# Patient Record
Sex: Female | Born: 1935 | Race: White | Hispanic: No | Smoking: Former smoker
Health system: Southern US, Community
[De-identification: ages and names within clinical notes are randomized; demographics above are authoritative.]

## PROBLEM LIST (undated history)

## (undated) DIAGNOSIS — E785 Hyperlipidemia, unspecified: Secondary | ICD-10-CM

## (undated) DIAGNOSIS — C50919 Malignant neoplasm of unspecified site of unspecified female breast: Secondary | ICD-10-CM

## (undated) DIAGNOSIS — I6529 Occlusion and stenosis of unspecified carotid artery: Secondary | ICD-10-CM

## (undated) DIAGNOSIS — F419 Anxiety disorder, unspecified: Secondary | ICD-10-CM

## (undated) DIAGNOSIS — I10 Essential (primary) hypertension: Secondary | ICD-10-CM

## (undated) HISTORY — DX: Anxiety disorder, unspecified: F41.9

## (undated) HISTORY — DX: Hyperlipidemia, unspecified: E78.5

## (undated) HISTORY — PX: LAPAROSCOPIC HYSTERECTOMY: SHX1926

## (undated) HISTORY — DX: Malignant neoplasm of unspecified site of unspecified female breast: C50.919

## (undated) HISTORY — PX: BLADDER SUSPENSION: SHX72

## (undated) HISTORY — DX: Essential (primary) hypertension: I10

## (undated) HISTORY — DX: Occlusion and stenosis of unspecified carotid artery: I65.29

---

## 2006-11-22 HISTORY — PX: BREAST SURGERY: SHX581

## 2014-11-22 HISTORY — PX: LYMPHADENECTOMY: SHX5960

## 2015-05-05 DIAGNOSIS — C8590 Non-Hodgkin lymphoma, unspecified, unspecified site: Secondary | ICD-10-CM

## 2015-05-05 DIAGNOSIS — Z8572 Personal history of non-Hodgkin lymphomas: Secondary | ICD-10-CM | POA: Insufficient documentation

## 2015-05-05 HISTORY — DX: Non-Hodgkin lymphoma, unspecified, unspecified site: C85.90

## 2015-05-25 DIAGNOSIS — J384 Edema of larynx: Secondary | ICD-10-CM | POA: Insufficient documentation

## 2015-05-25 DIAGNOSIS — R0600 Dyspnea, unspecified: Secondary | ICD-10-CM

## 2015-05-25 DIAGNOSIS — E871 Hypo-osmolality and hyponatremia: Secondary | ICD-10-CM | POA: Insufficient documentation

## 2015-05-25 DIAGNOSIS — R1310 Dysphagia, unspecified: Secondary | ICD-10-CM | POA: Insufficient documentation

## 2015-05-25 HISTORY — DX: Hypo-osmolality and hyponatremia: E87.1

## 2015-05-25 HISTORY — DX: Dysphagia, unspecified: R13.10

## 2015-05-25 HISTORY — DX: Dyspnea, unspecified: R06.00

## 2015-05-25 HISTORY — DX: Edema of larynx: J38.4

## 2015-05-27 DIAGNOSIS — I10 Essential (primary) hypertension: Secondary | ICD-10-CM

## 2015-05-27 DIAGNOSIS — T66XXXA Radiation sickness, unspecified, initial encounter: Secondary | ICD-10-CM

## 2015-05-27 HISTORY — DX: Essential (primary) hypertension: I10

## 2015-05-27 HISTORY — DX: Radiation sickness, unspecified, initial encounter: T66.XXXA

## 2015-12-04 DIAGNOSIS — I10 Essential (primary) hypertension: Secondary | ICD-10-CM

## 2015-12-04 DIAGNOSIS — H538 Other visual disturbances: Secondary | ICD-10-CM | POA: Insufficient documentation

## 2015-12-04 HISTORY — DX: Other visual disturbances: H53.8

## 2015-12-04 HISTORY — DX: Essential (primary) hypertension: I10

## 2015-12-24 DIAGNOSIS — C8330 Diffuse large B-cell lymphoma, unspecified site: Secondary | ICD-10-CM | POA: Diagnosis not present

## 2015-12-24 DIAGNOSIS — Z7982 Long term (current) use of aspirin: Secondary | ICD-10-CM | POA: Diagnosis not present

## 2015-12-24 DIAGNOSIS — Z853 Personal history of malignant neoplasm of breast: Secondary | ICD-10-CM | POA: Diagnosis not present

## 2015-12-24 DIAGNOSIS — H409 Unspecified glaucoma: Secondary | ICD-10-CM | POA: Diagnosis not present

## 2015-12-24 DIAGNOSIS — I10 Essential (primary) hypertension: Secondary | ICD-10-CM | POA: Diagnosis not present

## 2015-12-24 DIAGNOSIS — C8590 Non-Hodgkin lymphoma, unspecified, unspecified site: Secondary | ICD-10-CM | POA: Diagnosis not present

## 2015-12-24 DIAGNOSIS — Z79899 Other long term (current) drug therapy: Secondary | ICD-10-CM | POA: Diagnosis not present

## 2015-12-24 DIAGNOSIS — Z923 Personal history of irradiation: Secondary | ICD-10-CM | POA: Diagnosis not present

## 2015-12-29 DIAGNOSIS — H401122 Primary open-angle glaucoma, left eye, moderate stage: Secondary | ICD-10-CM | POA: Diagnosis not present

## 2015-12-29 DIAGNOSIS — H52223 Regular astigmatism, bilateral: Secondary | ICD-10-CM | POA: Diagnosis not present

## 2015-12-29 DIAGNOSIS — C8330 Diffuse large B-cell lymphoma, unspecified site: Secondary | ICD-10-CM | POA: Diagnosis not present

## 2015-12-29 DIAGNOSIS — H10213 Acute toxic conjunctivitis, bilateral: Secondary | ICD-10-CM | POA: Diagnosis not present

## 2015-12-29 DIAGNOSIS — H5203 Hypermetropia, bilateral: Secondary | ICD-10-CM | POA: Diagnosis not present

## 2015-12-29 DIAGNOSIS — H43813 Vitreous degeneration, bilateral: Secondary | ICD-10-CM | POA: Diagnosis not present

## 2015-12-29 DIAGNOSIS — H401111 Primary open-angle glaucoma, right eye, mild stage: Secondary | ICD-10-CM | POA: Diagnosis not present

## 2015-12-29 DIAGNOSIS — Z961 Presence of intraocular lens: Secondary | ICD-10-CM | POA: Diagnosis not present

## 2015-12-29 DIAGNOSIS — H04123 Dry eye syndrome of bilateral lacrimal glands: Secondary | ICD-10-CM | POA: Diagnosis not present

## 2015-12-29 DIAGNOSIS — H35363 Drusen (degenerative) of macula, bilateral: Secondary | ICD-10-CM | POA: Diagnosis not present

## 2016-01-05 DIAGNOSIS — I10 Essential (primary) hypertension: Secondary | ICD-10-CM | POA: Diagnosis not present

## 2016-01-22 DIAGNOSIS — M25561 Pain in right knee: Secondary | ICD-10-CM | POA: Diagnosis not present

## 2016-01-22 DIAGNOSIS — M25461 Effusion, right knee: Secondary | ICD-10-CM | POA: Insufficient documentation

## 2016-01-22 DIAGNOSIS — M25562 Pain in left knee: Secondary | ICD-10-CM | POA: Diagnosis not present

## 2016-01-22 DIAGNOSIS — M170 Bilateral primary osteoarthritis of knee: Secondary | ICD-10-CM

## 2016-01-22 HISTORY — DX: Effusion, right knee: M25.461

## 2016-01-22 HISTORY — DX: Bilateral primary osteoarthritis of knee: M17.0

## 2016-02-26 DIAGNOSIS — H43813 Vitreous degeneration, bilateral: Secondary | ICD-10-CM | POA: Diagnosis not present

## 2016-02-26 DIAGNOSIS — H52223 Regular astigmatism, bilateral: Secondary | ICD-10-CM | POA: Diagnosis not present

## 2016-02-26 DIAGNOSIS — H401111 Primary open-angle glaucoma, right eye, mild stage: Secondary | ICD-10-CM | POA: Diagnosis not present

## 2016-02-26 DIAGNOSIS — H04123 Dry eye syndrome of bilateral lacrimal glands: Secondary | ICD-10-CM | POA: Diagnosis not present

## 2016-02-26 DIAGNOSIS — C8330 Diffuse large B-cell lymphoma, unspecified site: Secondary | ICD-10-CM | POA: Diagnosis not present

## 2016-02-26 DIAGNOSIS — H401122 Primary open-angle glaucoma, left eye, moderate stage: Secondary | ICD-10-CM | POA: Diagnosis not present

## 2016-02-26 DIAGNOSIS — H34211 Partial retinal artery occlusion, right eye: Secondary | ICD-10-CM | POA: Diagnosis not present

## 2016-02-26 DIAGNOSIS — H5203 Hypermetropia, bilateral: Secondary | ICD-10-CM | POA: Diagnosis not present

## 2016-02-26 DIAGNOSIS — H35363 Drusen (degenerative) of macula, bilateral: Secondary | ICD-10-CM | POA: Diagnosis not present

## 2016-02-26 DIAGNOSIS — Z961 Presence of intraocular lens: Secondary | ICD-10-CM | POA: Diagnosis not present

## 2016-02-29 DIAGNOSIS — Z961 Presence of intraocular lens: Secondary | ICD-10-CM

## 2016-02-29 DIAGNOSIS — H04123 Dry eye syndrome of bilateral lacrimal glands: Secondary | ICD-10-CM | POA: Insufficient documentation

## 2016-02-29 DIAGNOSIS — C8330 Diffuse large B-cell lymphoma, unspecified site: Secondary | ICD-10-CM | POA: Insufficient documentation

## 2016-02-29 DIAGNOSIS — H43813 Vitreous degeneration, bilateral: Secondary | ICD-10-CM

## 2016-02-29 DIAGNOSIS — H401122 Primary open-angle glaucoma, left eye, moderate stage: Secondary | ICD-10-CM

## 2016-02-29 DIAGNOSIS — H35363 Drusen (degenerative) of macula, bilateral: Secondary | ICD-10-CM | POA: Insufficient documentation

## 2016-02-29 HISTORY — DX: Presence of intraocular lens: Z96.1

## 2016-02-29 HISTORY — DX: Primary open-angle glaucoma, left eye, moderate stage: H40.1122

## 2016-02-29 HISTORY — DX: Drusen (degenerative) of macula, bilateral: H35.363

## 2016-02-29 HISTORY — DX: Dry eye syndrome of bilateral lacrimal glands: H04.123

## 2016-02-29 HISTORY — DX: Diffuse large B-cell lymphoma, unspecified site: C83.30

## 2016-02-29 HISTORY — DX: Vitreous degeneration, bilateral: H43.813

## 2016-03-15 DIAGNOSIS — I10 Essential (primary) hypertension: Secondary | ICD-10-CM | POA: Diagnosis not present

## 2016-03-24 DIAGNOSIS — D7589 Other specified diseases of blood and blood-forming organs: Secondary | ICD-10-CM | POA: Diagnosis not present

## 2016-03-24 DIAGNOSIS — Z79899 Other long term (current) drug therapy: Secondary | ICD-10-CM | POA: Diagnosis not present

## 2016-03-24 DIAGNOSIS — I10 Essential (primary) hypertension: Secondary | ICD-10-CM | POA: Diagnosis not present

## 2016-03-24 DIAGNOSIS — C8590 Non-Hodgkin lymphoma, unspecified, unspecified site: Secondary | ICD-10-CM | POA: Diagnosis not present

## 2016-05-05 DIAGNOSIS — M170 Bilateral primary osteoarthritis of knee: Secondary | ICD-10-CM | POA: Diagnosis not present

## 2016-06-25 DIAGNOSIS — H538 Other visual disturbances: Secondary | ICD-10-CM | POA: Diagnosis not present

## 2016-06-25 DIAGNOSIS — I480 Paroxysmal atrial fibrillation: Secondary | ICD-10-CM | POA: Diagnosis not present

## 2016-06-25 DIAGNOSIS — C8590 Non-Hodgkin lymphoma, unspecified, unspecified site: Secondary | ICD-10-CM | POA: Diagnosis not present

## 2016-06-25 DIAGNOSIS — Z79899 Other long term (current) drug therapy: Secondary | ICD-10-CM | POA: Diagnosis not present

## 2016-06-25 DIAGNOSIS — R0600 Dyspnea, unspecified: Secondary | ICD-10-CM | POA: Diagnosis not present

## 2016-06-25 DIAGNOSIS — Z9289 Personal history of other medical treatment: Secondary | ICD-10-CM | POA: Diagnosis not present

## 2016-06-25 DIAGNOSIS — Z90710 Acquired absence of both cervix and uterus: Secondary | ICD-10-CM | POA: Diagnosis not present

## 2016-06-25 DIAGNOSIS — Z923 Personal history of irradiation: Secondary | ICD-10-CM | POA: Diagnosis not present

## 2016-06-25 DIAGNOSIS — M170 Bilateral primary osteoarthritis of knee: Secondary | ICD-10-CM | POA: Diagnosis not present

## 2016-06-25 DIAGNOSIS — R1310 Dysphagia, unspecified: Secondary | ICD-10-CM | POA: Diagnosis not present

## 2016-06-25 DIAGNOSIS — J384 Edema of larynx: Secondary | ICD-10-CM | POA: Diagnosis not present

## 2016-06-25 DIAGNOSIS — E871 Hypo-osmolality and hyponatremia: Secondary | ICD-10-CM | POA: Diagnosis not present

## 2016-06-25 DIAGNOSIS — Z888 Allergy status to other drugs, medicaments and biological substances status: Secondary | ICD-10-CM | POA: Diagnosis not present

## 2016-06-25 DIAGNOSIS — M25461 Effusion, right knee: Secondary | ICD-10-CM | POA: Diagnosis not present

## 2016-06-25 DIAGNOSIS — M542 Cervicalgia: Secondary | ICD-10-CM | POA: Diagnosis not present

## 2016-06-25 DIAGNOSIS — Z853 Personal history of malignant neoplasm of breast: Secondary | ICD-10-CM | POA: Diagnosis not present

## 2016-06-25 DIAGNOSIS — I10 Essential (primary) hypertension: Secondary | ICD-10-CM | POA: Diagnosis not present

## 2016-06-25 DIAGNOSIS — Z880 Allergy status to penicillin: Secondary | ICD-10-CM | POA: Diagnosis not present

## 2016-06-25 DIAGNOSIS — G4489 Other headache syndrome: Secondary | ICD-10-CM | POA: Diagnosis not present

## 2016-06-25 DIAGNOSIS — Z7982 Long term (current) use of aspirin: Secondary | ICD-10-CM | POA: Diagnosis not present

## 2016-07-01 DIAGNOSIS — Z961 Presence of intraocular lens: Secondary | ICD-10-CM | POA: Diagnosis not present

## 2016-07-01 DIAGNOSIS — I10 Essential (primary) hypertension: Secondary | ICD-10-CM | POA: Diagnosis not present

## 2016-07-01 DIAGNOSIS — H43813 Vitreous degeneration, bilateral: Secondary | ICD-10-CM | POA: Diagnosis not present

## 2016-07-01 DIAGNOSIS — H35363 Drusen (degenerative) of macula, bilateral: Secondary | ICD-10-CM | POA: Diagnosis not present

## 2016-07-01 DIAGNOSIS — H04123 Dry eye syndrome of bilateral lacrimal glands: Secondary | ICD-10-CM | POA: Diagnosis not present

## 2016-07-01 DIAGNOSIS — C8330 Diffuse large B-cell lymphoma, unspecified site: Secondary | ICD-10-CM | POA: Diagnosis not present

## 2016-07-01 DIAGNOSIS — H34211 Partial retinal artery occlusion, right eye: Secondary | ICD-10-CM | POA: Diagnosis not present

## 2016-07-01 DIAGNOSIS — H401122 Primary open-angle glaucoma, left eye, moderate stage: Secondary | ICD-10-CM | POA: Diagnosis not present

## 2016-07-01 DIAGNOSIS — Z8679 Personal history of other diseases of the circulatory system: Secondary | ICD-10-CM | POA: Diagnosis not present

## 2016-07-01 DIAGNOSIS — H401111 Primary open-angle glaucoma, right eye, mild stage: Secondary | ICD-10-CM | POA: Diagnosis not present

## 2016-07-21 DIAGNOSIS — I10 Essential (primary) hypertension: Secondary | ICD-10-CM | POA: Diagnosis not present

## 2016-07-21 DIAGNOSIS — M25412 Effusion, left shoulder: Secondary | ICD-10-CM | POA: Diagnosis not present

## 2016-07-23 DIAGNOSIS — G8929 Other chronic pain: Secondary | ICD-10-CM | POA: Diagnosis not present

## 2016-07-23 DIAGNOSIS — M12812 Other specific arthropathies, not elsewhere classified, left shoulder: Secondary | ICD-10-CM | POA: Diagnosis not present

## 2016-07-23 DIAGNOSIS — M19012 Primary osteoarthritis, left shoulder: Secondary | ICD-10-CM | POA: Diagnosis not present

## 2016-07-23 DIAGNOSIS — M25412 Effusion, left shoulder: Secondary | ICD-10-CM | POA: Diagnosis not present

## 2016-07-23 DIAGNOSIS — I10 Essential (primary) hypertension: Secondary | ICD-10-CM | POA: Diagnosis not present

## 2016-07-23 DIAGNOSIS — M25512 Pain in left shoulder: Secondary | ICD-10-CM | POA: Diagnosis not present

## 2016-08-04 DIAGNOSIS — M170 Bilateral primary osteoarthritis of knee: Secondary | ICD-10-CM | POA: Diagnosis not present

## 2016-08-12 DIAGNOSIS — M1711 Unilateral primary osteoarthritis, right knee: Secondary | ICD-10-CM | POA: Diagnosis not present

## 2016-08-12 DIAGNOSIS — M1712 Unilateral primary osteoarthritis, left knee: Secondary | ICD-10-CM | POA: Diagnosis not present

## 2016-08-12 DIAGNOSIS — M170 Bilateral primary osteoarthritis of knee: Secondary | ICD-10-CM | POA: Diagnosis not present

## 2016-09-24 DIAGNOSIS — C8331 Diffuse large B-cell lymphoma, lymph nodes of head, face, and neck: Secondary | ICD-10-CM | POA: Diagnosis not present

## 2016-09-24 DIAGNOSIS — I10 Essential (primary) hypertension: Secondary | ICD-10-CM | POA: Diagnosis not present

## 2016-09-24 DIAGNOSIS — Z888 Allergy status to other drugs, medicaments and biological substances status: Secondary | ICD-10-CM | POA: Diagnosis not present

## 2016-09-24 DIAGNOSIS — Z881 Allergy status to other antibiotic agents status: Secondary | ICD-10-CM | POA: Diagnosis not present

## 2016-09-24 DIAGNOSIS — R278 Other lack of coordination: Secondary | ICD-10-CM | POA: Diagnosis not present

## 2016-09-24 DIAGNOSIS — I480 Paroxysmal atrial fibrillation: Secondary | ICD-10-CM | POA: Diagnosis not present

## 2016-09-24 DIAGNOSIS — H401111 Primary open-angle glaucoma, right eye, mild stage: Secondary | ICD-10-CM | POA: Diagnosis not present

## 2016-09-24 DIAGNOSIS — Z90710 Acquired absence of both cervix and uterus: Secondary | ICD-10-CM | POA: Diagnosis not present

## 2016-09-24 DIAGNOSIS — Z880 Allergy status to penicillin: Secondary | ICD-10-CM | POA: Diagnosis not present

## 2016-09-24 DIAGNOSIS — Z886 Allergy status to analgesic agent status: Secondary | ICD-10-CM | POA: Diagnosis not present

## 2016-09-24 DIAGNOSIS — Z79891 Long term (current) use of opiate analgesic: Secondary | ICD-10-CM | POA: Diagnosis not present

## 2016-09-24 DIAGNOSIS — Z853 Personal history of malignant neoplasm of breast: Secondary | ICD-10-CM | POA: Diagnosis not present

## 2016-09-24 DIAGNOSIS — Z8572 Personal history of non-Hodgkin lymphomas: Secondary | ICD-10-CM | POA: Diagnosis not present

## 2016-09-24 DIAGNOSIS — R5383 Other fatigue: Secondary | ICD-10-CM | POA: Diagnosis not present

## 2016-09-24 DIAGNOSIS — H401122 Primary open-angle glaucoma, left eye, moderate stage: Secondary | ICD-10-CM | POA: Diagnosis not present

## 2016-09-24 DIAGNOSIS — G629 Polyneuropathy, unspecified: Secondary | ICD-10-CM | POA: Diagnosis not present

## 2016-09-24 DIAGNOSIS — M13819 Other specified arthritis, unspecified shoulder: Secondary | ICD-10-CM | POA: Diagnosis not present

## 2016-09-24 DIAGNOSIS — Z08 Encounter for follow-up examination after completed treatment for malignant neoplasm: Secondary | ICD-10-CM | POA: Diagnosis not present

## 2016-09-24 DIAGNOSIS — Z79899 Other long term (current) drug therapy: Secondary | ICD-10-CM | POA: Diagnosis not present

## 2016-09-24 DIAGNOSIS — H35363 Drusen (degenerative) of macula, bilateral: Secondary | ICD-10-CM | POA: Diagnosis not present

## 2016-09-24 DIAGNOSIS — H43813 Vitreous degeneration, bilateral: Secondary | ICD-10-CM | POA: Diagnosis not present

## 2016-09-24 DIAGNOSIS — Z923 Personal history of irradiation: Secondary | ICD-10-CM | POA: Diagnosis not present

## 2016-09-24 DIAGNOSIS — M170 Bilateral primary osteoarthritis of knee: Secondary | ICD-10-CM | POA: Diagnosis not present

## 2016-09-24 DIAGNOSIS — C8590 Non-Hodgkin lymphoma, unspecified, unspecified site: Secondary | ICD-10-CM | POA: Diagnosis not present

## 2016-09-24 DIAGNOSIS — Z961 Presence of intraocular lens: Secondary | ICD-10-CM | POA: Diagnosis not present

## 2016-09-24 DIAGNOSIS — Z7982 Long term (current) use of aspirin: Secondary | ICD-10-CM | POA: Diagnosis not present

## 2016-10-05 DIAGNOSIS — M542 Cervicalgia: Secondary | ICD-10-CM | POA: Diagnosis not present

## 2016-10-05 DIAGNOSIS — M62838 Other muscle spasm: Secondary | ICD-10-CM | POA: Diagnosis not present

## 2016-10-08 DIAGNOSIS — M542 Cervicalgia: Secondary | ICD-10-CM | POA: Diagnosis not present

## 2016-10-08 DIAGNOSIS — M47812 Spondylosis without myelopathy or radiculopathy, cervical region: Secondary | ICD-10-CM | POA: Diagnosis not present

## 2016-10-26 DIAGNOSIS — I10 Essential (primary) hypertension: Secondary | ICD-10-CM | POA: Diagnosis not present

## 2016-10-26 DIAGNOSIS — M85852 Other specified disorders of bone density and structure, left thigh: Secondary | ICD-10-CM

## 2016-10-26 DIAGNOSIS — Z0000 Encounter for general adult medical examination without abnormal findings: Secondary | ICD-10-CM | POA: Diagnosis not present

## 2016-10-26 DIAGNOSIS — G609 Hereditary and idiopathic neuropathy, unspecified: Secondary | ICD-10-CM

## 2016-10-26 DIAGNOSIS — Z8572 Personal history of non-Hodgkin lymphomas: Secondary | ICD-10-CM

## 2016-10-26 DIAGNOSIS — M8580 Other specified disorders of bone density and structure, unspecified site: Secondary | ICD-10-CM | POA: Insufficient documentation

## 2016-10-26 DIAGNOSIS — E785 Hyperlipidemia, unspecified: Secondary | ICD-10-CM | POA: Insufficient documentation

## 2016-10-26 DIAGNOSIS — M170 Bilateral primary osteoarthritis of knee: Secondary | ICD-10-CM | POA: Diagnosis not present

## 2016-10-26 HISTORY — DX: Hereditary and idiopathic neuropathy, unspecified: G60.9

## 2016-10-26 HISTORY — DX: Hyperlipidemia, unspecified: E78.5

## 2016-10-26 HISTORY — DX: Personal history of non-Hodgkin lymphomas: Z85.72

## 2016-10-26 HISTORY — DX: Other specified disorders of bone density and structure, left thigh: M85.852

## 2016-11-11 DIAGNOSIS — Z8572 Personal history of non-Hodgkin lymphomas: Secondary | ICD-10-CM | POA: Diagnosis not present

## 2016-11-11 DIAGNOSIS — M170 Bilateral primary osteoarthritis of knee: Secondary | ICD-10-CM | POA: Diagnosis not present

## 2016-11-11 DIAGNOSIS — G609 Hereditary and idiopathic neuropathy, unspecified: Secondary | ICD-10-CM | POA: Diagnosis not present

## 2016-11-11 DIAGNOSIS — M85859 Other specified disorders of bone density and structure, unspecified thigh: Secondary | ICD-10-CM | POA: Diagnosis not present

## 2016-11-11 DIAGNOSIS — E785 Hyperlipidemia, unspecified: Secondary | ICD-10-CM | POA: Diagnosis not present

## 2017-01-06 DIAGNOSIS — H43813 Vitreous degeneration, bilateral: Secondary | ICD-10-CM | POA: Diagnosis not present

## 2017-01-06 DIAGNOSIS — H35363 Drusen (degenerative) of macula, bilateral: Secondary | ICD-10-CM | POA: Diagnosis not present

## 2017-01-06 DIAGNOSIS — Z961 Presence of intraocular lens: Secondary | ICD-10-CM | POA: Diagnosis not present

## 2017-01-06 DIAGNOSIS — H401122 Primary open-angle glaucoma, left eye, moderate stage: Secondary | ICD-10-CM | POA: Diagnosis not present

## 2017-01-06 DIAGNOSIS — H401111 Primary open-angle glaucoma, right eye, mild stage: Secondary | ICD-10-CM | POA: Diagnosis not present

## 2017-01-06 DIAGNOSIS — H04123 Dry eye syndrome of bilateral lacrimal glands: Secondary | ICD-10-CM | POA: Diagnosis not present

## 2017-01-06 DIAGNOSIS — H34211 Partial retinal artery occlusion, right eye: Secondary | ICD-10-CM | POA: Diagnosis not present

## 2017-01-11 DIAGNOSIS — M1712 Unilateral primary osteoarthritis, left knee: Secondary | ICD-10-CM

## 2017-01-11 DIAGNOSIS — M1711 Unilateral primary osteoarthritis, right knee: Secondary | ICD-10-CM | POA: Diagnosis not present

## 2017-01-11 DIAGNOSIS — M25461 Effusion, right knee: Secondary | ICD-10-CM | POA: Diagnosis not present

## 2017-01-11 HISTORY — DX: Unilateral primary osteoarthritis, left knee: M17.12

## 2017-01-26 DIAGNOSIS — C8590 Non-Hodgkin lymphoma, unspecified, unspecified site: Secondary | ICD-10-CM | POA: Diagnosis not present

## 2017-01-26 DIAGNOSIS — C8330 Diffuse large B-cell lymphoma, unspecified site: Secondary | ICD-10-CM | POA: Diagnosis not present

## 2017-01-26 DIAGNOSIS — I480 Paroxysmal atrial fibrillation: Secondary | ICD-10-CM | POA: Diagnosis not present

## 2017-01-26 DIAGNOSIS — Z7982 Long term (current) use of aspirin: Secondary | ICD-10-CM | POA: Diagnosis not present

## 2017-01-26 DIAGNOSIS — I10 Essential (primary) hypertension: Secondary | ICD-10-CM | POA: Diagnosis not present

## 2017-01-26 DIAGNOSIS — Z961 Presence of intraocular lens: Secondary | ICD-10-CM | POA: Diagnosis not present

## 2017-01-26 DIAGNOSIS — Z923 Personal history of irradiation: Secondary | ICD-10-CM | POA: Diagnosis not present

## 2017-01-26 DIAGNOSIS — M25461 Effusion, right knee: Secondary | ICD-10-CM | POA: Diagnosis not present

## 2017-01-26 DIAGNOSIS — M170 Bilateral primary osteoarthritis of knee: Secondary | ICD-10-CM | POA: Diagnosis not present

## 2017-03-21 DIAGNOSIS — I700 Atherosclerosis of aorta: Secondary | ICD-10-CM | POA: Diagnosis not present

## 2017-03-21 DIAGNOSIS — M25512 Pain in left shoulder: Secondary | ICD-10-CM | POA: Diagnosis not present

## 2017-03-21 DIAGNOSIS — G8929 Other chronic pain: Secondary | ICD-10-CM | POA: Diagnosis not present

## 2017-03-21 DIAGNOSIS — M19012 Primary osteoarthritis, left shoulder: Secondary | ICD-10-CM | POA: Diagnosis not present

## 2017-03-21 DIAGNOSIS — I10 Essential (primary) hypertension: Secondary | ICD-10-CM | POA: Diagnosis not present

## 2017-03-21 DIAGNOSIS — J40 Bronchitis, not specified as acute or chronic: Secondary | ICD-10-CM | POA: Diagnosis not present

## 2017-03-21 DIAGNOSIS — M67431 Ganglion, right wrist: Secondary | ICD-10-CM | POA: Diagnosis not present

## 2017-04-07 DIAGNOSIS — M19012 Primary osteoarthritis, left shoulder: Secondary | ICD-10-CM | POA: Diagnosis not present

## 2017-04-07 DIAGNOSIS — G8929 Other chronic pain: Secondary | ICD-10-CM | POA: Diagnosis not present

## 2017-04-07 DIAGNOSIS — M25512 Pain in left shoulder: Secondary | ICD-10-CM | POA: Diagnosis not present

## 2017-04-12 DIAGNOSIS — M19011 Primary osteoarthritis, right shoulder: Secondary | ICD-10-CM

## 2017-04-12 DIAGNOSIS — M19012 Primary osteoarthritis, left shoulder: Secondary | ICD-10-CM

## 2017-04-12 HISTORY — DX: Primary osteoarthritis, left shoulder: M19.012

## 2017-04-12 HISTORY — DX: Primary osteoarthritis, right shoulder: M19.011

## 2017-04-25 DIAGNOSIS — G8929 Other chronic pain: Secondary | ICD-10-CM | POA: Diagnosis not present

## 2017-04-25 DIAGNOSIS — M25512 Pain in left shoulder: Secondary | ICD-10-CM | POA: Diagnosis not present

## 2017-04-25 DIAGNOSIS — M6281 Muscle weakness (generalized): Secondary | ICD-10-CM | POA: Diagnosis not present

## 2017-04-25 DIAGNOSIS — M25612 Stiffness of left shoulder, not elsewhere classified: Secondary | ICD-10-CM | POA: Diagnosis not present

## 2017-04-26 DIAGNOSIS — H903 Sensorineural hearing loss, bilateral: Secondary | ICD-10-CM

## 2017-04-26 DIAGNOSIS — H6123 Impacted cerumen, bilateral: Secondary | ICD-10-CM

## 2017-04-26 HISTORY — DX: Impacted cerumen, bilateral: H61.23

## 2017-04-26 HISTORY — DX: Sensorineural hearing loss, bilateral: H90.3

## 2017-05-02 DIAGNOSIS — G8929 Other chronic pain: Secondary | ICD-10-CM | POA: Diagnosis not present

## 2017-05-02 DIAGNOSIS — M6281 Muscle weakness (generalized): Secondary | ICD-10-CM | POA: Diagnosis not present

## 2017-05-02 DIAGNOSIS — M25612 Stiffness of left shoulder, not elsewhere classified: Secondary | ICD-10-CM | POA: Diagnosis not present

## 2017-05-02 DIAGNOSIS — M25512 Pain in left shoulder: Secondary | ICD-10-CM | POA: Diagnosis not present

## 2017-05-05 DIAGNOSIS — M25612 Stiffness of left shoulder, not elsewhere classified: Secondary | ICD-10-CM | POA: Diagnosis not present

## 2017-05-05 DIAGNOSIS — M6281 Muscle weakness (generalized): Secondary | ICD-10-CM | POA: Diagnosis not present

## 2017-05-05 DIAGNOSIS — G8929 Other chronic pain: Secondary | ICD-10-CM | POA: Diagnosis not present

## 2017-05-05 DIAGNOSIS — M25512 Pain in left shoulder: Secondary | ICD-10-CM | POA: Diagnosis not present

## 2017-05-10 DIAGNOSIS — M25512 Pain in left shoulder: Secondary | ICD-10-CM | POA: Diagnosis not present

## 2017-05-10 DIAGNOSIS — G8929 Other chronic pain: Secondary | ICD-10-CM | POA: Diagnosis not present

## 2017-05-10 DIAGNOSIS — M25612 Stiffness of left shoulder, not elsewhere classified: Secondary | ICD-10-CM | POA: Diagnosis not present

## 2017-05-10 DIAGNOSIS — M6281 Muscle weakness (generalized): Secondary | ICD-10-CM | POA: Diagnosis not present

## 2017-05-17 DIAGNOSIS — G8929 Other chronic pain: Secondary | ICD-10-CM | POA: Diagnosis not present

## 2017-05-17 DIAGNOSIS — M25612 Stiffness of left shoulder, not elsewhere classified: Secondary | ICD-10-CM | POA: Diagnosis not present

## 2017-05-17 DIAGNOSIS — M6281 Muscle weakness (generalized): Secondary | ICD-10-CM | POA: Diagnosis not present

## 2017-05-17 DIAGNOSIS — M25512 Pain in left shoulder: Secondary | ICD-10-CM | POA: Diagnosis not present

## 2017-07-07 DIAGNOSIS — H401111 Primary open-angle glaucoma, right eye, mild stage: Secondary | ICD-10-CM | POA: Diagnosis not present

## 2017-07-07 DIAGNOSIS — H401122 Primary open-angle glaucoma, left eye, moderate stage: Secondary | ICD-10-CM | POA: Diagnosis not present

## 2017-07-07 DIAGNOSIS — H34211 Partial retinal artery occlusion, right eye: Secondary | ICD-10-CM | POA: Diagnosis not present

## 2017-07-07 DIAGNOSIS — H04123 Dry eye syndrome of bilateral lacrimal glands: Secondary | ICD-10-CM | POA: Diagnosis not present

## 2017-07-13 DIAGNOSIS — I10 Essential (primary) hypertension: Secondary | ICD-10-CM | POA: Diagnosis not present

## 2017-07-13 DIAGNOSIS — R490 Dysphonia: Secondary | ICD-10-CM | POA: Diagnosis not present

## 2017-07-13 DIAGNOSIS — F322 Major depressive disorder, single episode, severe without psychotic features: Secondary | ICD-10-CM | POA: Diagnosis not present

## 2017-07-13 DIAGNOSIS — R1310 Dysphagia, unspecified: Secondary | ICD-10-CM | POA: Diagnosis not present

## 2017-08-02 DIAGNOSIS — R1310 Dysphagia, unspecified: Secondary | ICD-10-CM | POA: Diagnosis not present

## 2017-08-17 DIAGNOSIS — Z923 Personal history of irradiation: Secondary | ICD-10-CM | POA: Diagnosis not present

## 2017-08-17 DIAGNOSIS — N63 Unspecified lump in unspecified breast: Secondary | ICD-10-CM | POA: Diagnosis not present

## 2017-08-17 DIAGNOSIS — C8330 Diffuse large B-cell lymphoma, unspecified site: Secondary | ICD-10-CM | POA: Diagnosis not present

## 2017-08-17 DIAGNOSIS — C50911 Malignant neoplasm of unspecified site of right female breast: Secondary | ICD-10-CM

## 2017-08-17 DIAGNOSIS — Z853 Personal history of malignant neoplasm of breast: Secondary | ICD-10-CM | POA: Diagnosis not present

## 2017-08-17 DIAGNOSIS — N6310 Unspecified lump in the right breast, unspecified quadrant: Secondary | ICD-10-CM | POA: Diagnosis not present

## 2017-08-17 HISTORY — DX: Malignant neoplasm of unspecified site of right female breast: C50.911

## 2017-08-26 DIAGNOSIS — R921 Mammographic calcification found on diagnostic imaging of breast: Secondary | ICD-10-CM | POA: Diagnosis not present

## 2017-08-26 DIAGNOSIS — N6311 Unspecified lump in the right breast, upper outer quadrant: Secondary | ICD-10-CM | POA: Diagnosis not present

## 2017-09-21 DIAGNOSIS — I10 Essential (primary) hypertension: Secondary | ICD-10-CM | POA: Diagnosis not present

## 2017-09-21 DIAGNOSIS — G609 Hereditary and idiopathic neuropathy, unspecified: Secondary | ICD-10-CM | POA: Diagnosis not present

## 2017-09-26 DIAGNOSIS — C50911 Malignant neoplasm of unspecified site of right female breast: Secondary | ICD-10-CM | POA: Diagnosis not present

## 2017-09-26 DIAGNOSIS — R920 Mammographic microcalcification found on diagnostic imaging of breast: Secondary | ICD-10-CM | POA: Diagnosis not present

## 2017-09-27 DIAGNOSIS — R920 Mammographic microcalcification found on diagnostic imaging of breast: Secondary | ICD-10-CM

## 2017-09-27 HISTORY — DX: Mammographic microcalcification found on diagnostic imaging of breast: R92.0

## 2017-10-04 DIAGNOSIS — N6032 Fibrosclerosis of left breast: Secondary | ICD-10-CM | POA: Diagnosis not present

## 2017-10-04 DIAGNOSIS — R920 Mammographic microcalcification found on diagnostic imaging of breast: Secondary | ICD-10-CM | POA: Diagnosis not present

## 2017-10-10 DIAGNOSIS — R920 Mammographic microcalcification found on diagnostic imaging of breast: Secondary | ICD-10-CM | POA: Diagnosis not present

## 2017-12-13 DIAGNOSIS — C8330 Diffuse large B-cell lymphoma, unspecified site: Secondary | ICD-10-CM | POA: Diagnosis not present

## 2017-12-13 DIAGNOSIS — Z853 Personal history of malignant neoplasm of breast: Secondary | ICD-10-CM | POA: Diagnosis not present

## 2017-12-13 DIAGNOSIS — R2231 Localized swelling, mass and lump, right upper limb: Secondary | ICD-10-CM | POA: Diagnosis not present

## 2017-12-19 DIAGNOSIS — C8330 Diffuse large B-cell lymphoma, unspecified site: Secondary | ICD-10-CM | POA: Diagnosis not present

## 2017-12-19 DIAGNOSIS — N6489 Other specified disorders of breast: Secondary | ICD-10-CM | POA: Diagnosis not present

## 2017-12-19 DIAGNOSIS — I10 Essential (primary) hypertension: Secondary | ICD-10-CM | POA: Diagnosis not present

## 2017-12-19 DIAGNOSIS — I2510 Atherosclerotic heart disease of native coronary artery without angina pectoris: Secondary | ICD-10-CM | POA: Diagnosis not present

## 2017-12-19 DIAGNOSIS — I700 Atherosclerosis of aorta: Secondary | ICD-10-CM | POA: Diagnosis not present

## 2017-12-19 DIAGNOSIS — R2231 Localized swelling, mass and lump, right upper limb: Secondary | ICD-10-CM | POA: Diagnosis not present

## 2017-12-19 DIAGNOSIS — R221 Localized swelling, mass and lump, neck: Secondary | ICD-10-CM | POA: Diagnosis not present

## 2017-12-20 DIAGNOSIS — F322 Major depressive disorder, single episode, severe without psychotic features: Secondary | ICD-10-CM | POA: Diagnosis not present

## 2017-12-20 DIAGNOSIS — G609 Hereditary and idiopathic neuropathy, unspecified: Secondary | ICD-10-CM | POA: Diagnosis not present

## 2017-12-20 DIAGNOSIS — M25819 Other specified joint disorders, unspecified shoulder: Secondary | ICD-10-CM

## 2017-12-20 DIAGNOSIS — R1310 Dysphagia, unspecified: Secondary | ICD-10-CM | POA: Diagnosis not present

## 2017-12-20 DIAGNOSIS — Z0000 Encounter for general adult medical examination without abnormal findings: Secondary | ICD-10-CM | POA: Diagnosis not present

## 2017-12-20 HISTORY — DX: Other specified joint disorders, unspecified shoulder: M25.819

## 2018-01-02 DIAGNOSIS — M67411 Ganglion, right shoulder: Secondary | ICD-10-CM | POA: Diagnosis not present

## 2018-01-02 DIAGNOSIS — M19011 Primary osteoarthritis, right shoulder: Secondary | ICD-10-CM | POA: Diagnosis not present

## 2018-01-02 DIAGNOSIS — M25512 Pain in left shoulder: Secondary | ICD-10-CM | POA: Diagnosis not present

## 2018-01-02 DIAGNOSIS — M75101 Unspecified rotator cuff tear or rupture of right shoulder, not specified as traumatic: Secondary | ICD-10-CM | POA: Diagnosis not present

## 2018-01-02 DIAGNOSIS — M25819 Other specified joint disorders, unspecified shoulder: Secondary | ICD-10-CM | POA: Diagnosis not present

## 2018-01-02 DIAGNOSIS — R2231 Localized swelling, mass and lump, right upper limb: Secondary | ICD-10-CM | POA: Diagnosis not present

## 2018-01-03 DIAGNOSIS — M12811 Other specific arthropathies, not elsewhere classified, right shoulder: Secondary | ICD-10-CM

## 2018-01-03 DIAGNOSIS — M75101 Unspecified rotator cuff tear or rupture of right shoulder, not specified as traumatic: Secondary | ICD-10-CM

## 2018-01-03 HISTORY — DX: Unspecified rotator cuff tear or rupture of right shoulder, not specified as traumatic: M75.101

## 2018-01-03 HISTORY — DX: Other specific arthropathies, not elsewhere classified, right shoulder: M12.811

## 2018-01-11 DIAGNOSIS — H401111 Primary open-angle glaucoma, right eye, mild stage: Secondary | ICD-10-CM | POA: Diagnosis not present

## 2018-01-11 DIAGNOSIS — H401122 Primary open-angle glaucoma, left eye, moderate stage: Secondary | ICD-10-CM | POA: Diagnosis not present

## 2018-02-07 DIAGNOSIS — H903 Sensorineural hearing loss, bilateral: Secondary | ICD-10-CM | POA: Diagnosis not present

## 2018-02-07 DIAGNOSIS — H6123 Impacted cerumen, bilateral: Secondary | ICD-10-CM | POA: Diagnosis not present

## 2018-02-12 DIAGNOSIS — H6123 Impacted cerumen, bilateral: Secondary | ICD-10-CM

## 2018-02-12 HISTORY — DX: Impacted cerumen, bilateral: H61.23

## 2018-04-03 DIAGNOSIS — M67411 Ganglion, right shoulder: Secondary | ICD-10-CM | POA: Diagnosis not present

## 2018-04-03 DIAGNOSIS — M25511 Pain in right shoulder: Secondary | ICD-10-CM | POA: Diagnosis not present

## 2018-04-03 DIAGNOSIS — M12811 Other specific arthropathies, not elsewhere classified, right shoulder: Secondary | ICD-10-CM | POA: Diagnosis not present

## 2018-04-03 DIAGNOSIS — M7989 Other specified soft tissue disorders: Secondary | ICD-10-CM | POA: Diagnosis not present

## 2018-06-07 DIAGNOSIS — M25811 Other specified joint disorders, right shoulder: Secondary | ICD-10-CM | POA: Diagnosis not present

## 2018-06-07 DIAGNOSIS — M25512 Pain in left shoulder: Secondary | ICD-10-CM | POA: Diagnosis not present

## 2018-06-07 DIAGNOSIS — M25511 Pain in right shoulder: Secondary | ICD-10-CM | POA: Diagnosis not present

## 2018-06-07 DIAGNOSIS — M19012 Primary osteoarthritis, left shoulder: Secondary | ICD-10-CM | POA: Diagnosis not present

## 2018-07-05 DIAGNOSIS — Z8572 Personal history of non-Hodgkin lymphomas: Secondary | ICD-10-CM | POA: Diagnosis not present

## 2018-07-05 DIAGNOSIS — C8330 Diffuse large B-cell lymphoma, unspecified site: Secondary | ICD-10-CM | POA: Diagnosis not present

## 2018-07-05 DIAGNOSIS — Z08 Encounter for follow-up examination after completed treatment for malignant neoplasm: Secondary | ICD-10-CM | POA: Diagnosis not present

## 2018-07-05 DIAGNOSIS — M19012 Primary osteoarthritis, left shoulder: Secondary | ICD-10-CM | POA: Diagnosis not present

## 2018-07-05 DIAGNOSIS — M19011 Primary osteoarthritis, right shoulder: Secondary | ICD-10-CM | POA: Diagnosis not present

## 2018-07-05 DIAGNOSIS — Z853 Personal history of malignant neoplasm of breast: Secondary | ICD-10-CM | POA: Diagnosis not present

## 2018-07-12 DIAGNOSIS — H04123 Dry eye syndrome of bilateral lacrimal glands: Secondary | ICD-10-CM | POA: Diagnosis not present

## 2018-07-12 DIAGNOSIS — H43813 Vitreous degeneration, bilateral: Secondary | ICD-10-CM | POA: Diagnosis not present

## 2018-07-12 DIAGNOSIS — H401111 Primary open-angle glaucoma, right eye, mild stage: Secondary | ICD-10-CM | POA: Diagnosis not present

## 2018-07-12 DIAGNOSIS — H401122 Primary open-angle glaucoma, left eye, moderate stage: Secondary | ICD-10-CM | POA: Diagnosis not present

## 2018-07-12 DIAGNOSIS — H34211 Partial retinal artery occlusion, right eye: Secondary | ICD-10-CM | POA: Diagnosis not present

## 2018-07-12 DIAGNOSIS — H35363 Drusen (degenerative) of macula, bilateral: Secondary | ICD-10-CM | POA: Diagnosis not present

## 2018-07-12 DIAGNOSIS — H52223 Regular astigmatism, bilateral: Secondary | ICD-10-CM | POA: Diagnosis not present

## 2018-07-12 DIAGNOSIS — Z961 Presence of intraocular lens: Secondary | ICD-10-CM | POA: Diagnosis not present

## 2018-07-12 DIAGNOSIS — H5203 Hypermetropia, bilateral: Secondary | ICD-10-CM | POA: Diagnosis not present

## 2018-07-20 DIAGNOSIS — M7542 Impingement syndrome of left shoulder: Secondary | ICD-10-CM | POA: Diagnosis not present

## 2018-07-20 DIAGNOSIS — M19012 Primary osteoarthritis, left shoulder: Secondary | ICD-10-CM | POA: Diagnosis not present

## 2018-08-08 DIAGNOSIS — M19012 Primary osteoarthritis, left shoulder: Secondary | ICD-10-CM | POA: Diagnosis not present

## 2018-09-06 DIAGNOSIS — M542 Cervicalgia: Secondary | ICD-10-CM | POA: Diagnosis not present

## 2018-09-06 DIAGNOSIS — M19012 Primary osteoarthritis, left shoulder: Secondary | ICD-10-CM | POA: Diagnosis not present

## 2018-10-03 DIAGNOSIS — M25412 Effusion, left shoulder: Secondary | ICD-10-CM | POA: Diagnosis not present

## 2018-10-03 DIAGNOSIS — R002 Palpitations: Secondary | ICD-10-CM | POA: Diagnosis not present

## 2018-10-03 DIAGNOSIS — I10 Essential (primary) hypertension: Secondary | ICD-10-CM | POA: Diagnosis not present

## 2018-12-25 DIAGNOSIS — I10 Essential (primary) hypertension: Secondary | ICD-10-CM | POA: Diagnosis not present

## 2018-12-27 DIAGNOSIS — F322 Major depressive disorder, single episode, severe without psychotic features: Secondary | ICD-10-CM | POA: Diagnosis not present

## 2018-12-27 DIAGNOSIS — H6123 Impacted cerumen, bilateral: Secondary | ICD-10-CM | POA: Diagnosis not present

## 2018-12-27 DIAGNOSIS — I10 Essential (primary) hypertension: Secondary | ICD-10-CM | POA: Diagnosis not present

## 2018-12-27 DIAGNOSIS — M75101 Unspecified rotator cuff tear or rupture of right shoulder, not specified as traumatic: Secondary | ICD-10-CM | POA: Diagnosis not present

## 2018-12-27 DIAGNOSIS — G609 Hereditary and idiopathic neuropathy, unspecified: Secondary | ICD-10-CM | POA: Diagnosis not present

## 2018-12-27 DIAGNOSIS — C50911 Malignant neoplasm of unspecified site of right female breast: Secondary | ICD-10-CM | POA: Diagnosis not present

## 2018-12-27 DIAGNOSIS — M67411 Ganglion, right shoulder: Secondary | ICD-10-CM | POA: Diagnosis not present

## 2018-12-27 DIAGNOSIS — M12811 Other specific arthropathies, not elsewhere classified, right shoulder: Secondary | ICD-10-CM | POA: Diagnosis not present

## 2018-12-27 DIAGNOSIS — C8330 Diffuse large B-cell lymphoma, unspecified site: Secondary | ICD-10-CM | POA: Diagnosis not present

## 2018-12-27 DIAGNOSIS — M170 Bilateral primary osteoarthritis of knee: Secondary | ICD-10-CM | POA: Diagnosis not present

## 2018-12-27 DIAGNOSIS — Z0000 Encounter for general adult medical examination without abnormal findings: Secondary | ICD-10-CM | POA: Diagnosis not present

## 2019-01-15 DIAGNOSIS — H401122 Primary open-angle glaucoma, left eye, moderate stage: Secondary | ICD-10-CM | POA: Diagnosis not present

## 2019-01-15 DIAGNOSIS — H01013 Ulcerative blepharitis right eye, unspecified eyelid: Secondary | ICD-10-CM | POA: Diagnosis not present

## 2019-01-15 DIAGNOSIS — H01016 Ulcerative blepharitis left eye, unspecified eyelid: Secondary | ICD-10-CM | POA: Diagnosis not present

## 2019-01-17 DIAGNOSIS — C8330 Diffuse large B-cell lymphoma, unspecified site: Secondary | ICD-10-CM | POA: Diagnosis not present

## 2019-01-17 DIAGNOSIS — Z853 Personal history of malignant neoplasm of breast: Secondary | ICD-10-CM | POA: Diagnosis not present

## 2019-01-17 DIAGNOSIS — C8331 Diffuse large B-cell lymphoma, lymph nodes of head, face, and neck: Secondary | ICD-10-CM | POA: Diagnosis not present

## 2019-01-18 DIAGNOSIS — N390 Urinary tract infection, site not specified: Secondary | ICD-10-CM | POA: Diagnosis not present

## 2019-01-30 DIAGNOSIS — C8330 Diffuse large B-cell lymphoma, unspecified site: Secondary | ICD-10-CM | POA: Diagnosis not present

## 2019-01-30 DIAGNOSIS — R42 Dizziness and giddiness: Secondary | ICD-10-CM | POA: Diagnosis not present

## 2019-01-31 DIAGNOSIS — Z923 Personal history of irradiation: Secondary | ICD-10-CM | POA: Diagnosis not present

## 2019-01-31 DIAGNOSIS — C8330 Diffuse large B-cell lymphoma, unspecified site: Secondary | ICD-10-CM | POA: Diagnosis not present

## 2019-01-31 DIAGNOSIS — Z853 Personal history of malignant neoplasm of breast: Secondary | ICD-10-CM | POA: Diagnosis not present

## 2019-03-13 DIAGNOSIS — H401111 Primary open-angle glaucoma, right eye, mild stage: Secondary | ICD-10-CM | POA: Diagnosis not present

## 2019-03-13 DIAGNOSIS — Z961 Presence of intraocular lens: Secondary | ICD-10-CM | POA: Diagnosis not present

## 2019-03-13 DIAGNOSIS — H401122 Primary open-angle glaucoma, left eye, moderate stage: Secondary | ICD-10-CM | POA: Diagnosis not present

## 2019-03-13 DIAGNOSIS — H4922 Sixth [abducent] nerve palsy, left eye: Secondary | ICD-10-CM

## 2019-03-13 DIAGNOSIS — H04123 Dry eye syndrome of bilateral lacrimal glands: Secondary | ICD-10-CM | POA: Diagnosis not present

## 2019-03-13 HISTORY — DX: Sixth (abducent) nerve palsy, left eye: H49.22

## 2019-08-02 DIAGNOSIS — H5203 Hypermetropia, bilateral: Secondary | ICD-10-CM | POA: Diagnosis not present

## 2019-08-02 DIAGNOSIS — H401122 Primary open-angle glaucoma, left eye, moderate stage: Secondary | ICD-10-CM | POA: Diagnosis not present

## 2019-08-02 DIAGNOSIS — H43813 Vitreous degeneration, bilateral: Secondary | ICD-10-CM | POA: Diagnosis not present

## 2019-08-02 DIAGNOSIS — H4922 Sixth [abducent] nerve palsy, left eye: Secondary | ICD-10-CM | POA: Diagnosis not present

## 2019-08-02 DIAGNOSIS — H04123 Dry eye syndrome of bilateral lacrimal glands: Secondary | ICD-10-CM | POA: Diagnosis not present

## 2019-08-02 DIAGNOSIS — H35363 Drusen (degenerative) of macula, bilateral: Secondary | ICD-10-CM | POA: Diagnosis not present

## 2019-08-02 DIAGNOSIS — H401111 Primary open-angle glaucoma, right eye, mild stage: Secondary | ICD-10-CM | POA: Diagnosis not present

## 2019-08-02 DIAGNOSIS — Z961 Presence of intraocular lens: Secondary | ICD-10-CM | POA: Diagnosis not present

## 2019-08-02 DIAGNOSIS — H524 Presbyopia: Secondary | ICD-10-CM | POA: Diagnosis not present

## 2019-08-28 DIAGNOSIS — G609 Hereditary and idiopathic neuropathy, unspecified: Secondary | ICD-10-CM | POA: Diagnosis not present

## 2019-08-28 DIAGNOSIS — M19011 Primary osteoarthritis, right shoulder: Secondary | ICD-10-CM | POA: Diagnosis not present

## 2019-08-28 DIAGNOSIS — M19012 Primary osteoarthritis, left shoulder: Secondary | ICD-10-CM | POA: Diagnosis not present

## 2019-08-28 DIAGNOSIS — M170 Bilateral primary osteoarthritis of knee: Secondary | ICD-10-CM | POA: Diagnosis not present

## 2019-08-28 DIAGNOSIS — M6281 Muscle weakness (generalized): Secondary | ICD-10-CM | POA: Diagnosis not present

## 2019-10-16 DIAGNOSIS — G9009 Other idiopathic peripheral autonomic neuropathy: Secondary | ICD-10-CM | POA: Diagnosis not present

## 2019-10-16 DIAGNOSIS — M19011 Primary osteoarthritis, right shoulder: Secondary | ICD-10-CM | POA: Diagnosis not present

## 2019-10-16 DIAGNOSIS — M19012 Primary osteoarthritis, left shoulder: Secondary | ICD-10-CM | POA: Diagnosis not present

## 2019-10-16 DIAGNOSIS — M6281 Muscle weakness (generalized): Secondary | ICD-10-CM | POA: Diagnosis not present

## 2019-11-15 DIAGNOSIS — G9009 Other idiopathic peripheral autonomic neuropathy: Secondary | ICD-10-CM | POA: Diagnosis not present

## 2019-11-15 DIAGNOSIS — M19011 Primary osteoarthritis, right shoulder: Secondary | ICD-10-CM | POA: Diagnosis not present

## 2019-11-15 DIAGNOSIS — M19012 Primary osteoarthritis, left shoulder: Secondary | ICD-10-CM | POA: Diagnosis not present

## 2019-11-15 DIAGNOSIS — M6281 Muscle weakness (generalized): Secondary | ICD-10-CM | POA: Diagnosis not present

## 2019-12-16 DIAGNOSIS — M19011 Primary osteoarthritis, right shoulder: Secondary | ICD-10-CM | POA: Diagnosis not present

## 2019-12-16 DIAGNOSIS — M19012 Primary osteoarthritis, left shoulder: Secondary | ICD-10-CM | POA: Diagnosis not present

## 2019-12-16 DIAGNOSIS — G9009 Other idiopathic peripheral autonomic neuropathy: Secondary | ICD-10-CM | POA: Diagnosis not present

## 2019-12-16 DIAGNOSIS — M6281 Muscle weakness (generalized): Secondary | ICD-10-CM | POA: Diagnosis not present

## 2020-01-07 DIAGNOSIS — I10 Essential (primary) hypertension: Secondary | ICD-10-CM | POA: Diagnosis not present

## 2020-01-09 DIAGNOSIS — H4922 Sixth [abducent] nerve palsy, left eye: Secondary | ICD-10-CM | POA: Diagnosis not present

## 2020-01-09 DIAGNOSIS — G478 Other sleep disorders: Secondary | ICD-10-CM | POA: Diagnosis not present

## 2020-01-09 DIAGNOSIS — F3342 Major depressive disorder, recurrent, in full remission: Secondary | ICD-10-CM | POA: Diagnosis not present

## 2020-01-09 DIAGNOSIS — Z0000 Encounter for general adult medical examination without abnormal findings: Secondary | ICD-10-CM | POA: Diagnosis not present

## 2020-01-09 DIAGNOSIS — G609 Hereditary and idiopathic neuropathy, unspecified: Secondary | ICD-10-CM | POA: Diagnosis not present

## 2020-01-09 DIAGNOSIS — Z87891 Personal history of nicotine dependence: Secondary | ICD-10-CM | POA: Diagnosis not present

## 2020-01-09 DIAGNOSIS — I10 Essential (primary) hypertension: Secondary | ICD-10-CM | POA: Diagnosis not present

## 2020-01-09 DIAGNOSIS — Z853 Personal history of malignant neoplasm of breast: Secondary | ICD-10-CM | POA: Diagnosis not present

## 2020-01-09 DIAGNOSIS — Z8572 Personal history of non-Hodgkin lymphomas: Secondary | ICD-10-CM | POA: Diagnosis not present

## 2020-01-16 DIAGNOSIS — M19011 Primary osteoarthritis, right shoulder: Secondary | ICD-10-CM | POA: Diagnosis not present

## 2020-01-16 DIAGNOSIS — M19012 Primary osteoarthritis, left shoulder: Secondary | ICD-10-CM | POA: Diagnosis not present

## 2020-01-16 DIAGNOSIS — M6281 Muscle weakness (generalized): Secondary | ICD-10-CM | POA: Diagnosis not present

## 2020-01-16 DIAGNOSIS — G9009 Other idiopathic peripheral autonomic neuropathy: Secondary | ICD-10-CM | POA: Diagnosis not present

## 2020-01-28 DIAGNOSIS — H903 Sensorineural hearing loss, bilateral: Secondary | ICD-10-CM | POA: Diagnosis not present

## 2020-01-28 DIAGNOSIS — H6123 Impacted cerumen, bilateral: Secondary | ICD-10-CM | POA: Diagnosis not present

## 2020-02-11 DIAGNOSIS — C8330 Diffuse large B-cell lymphoma, unspecified site: Secondary | ICD-10-CM | POA: Diagnosis not present

## 2020-02-11 DIAGNOSIS — Z923 Personal history of irradiation: Secondary | ICD-10-CM | POA: Diagnosis not present

## 2020-02-11 DIAGNOSIS — Z08 Encounter for follow-up examination after completed treatment for malignant neoplasm: Secondary | ICD-10-CM | POA: Diagnosis not present

## 2020-02-11 DIAGNOSIS — Z853 Personal history of malignant neoplasm of breast: Secondary | ICD-10-CM | POA: Diagnosis not present

## 2020-02-11 DIAGNOSIS — Z8572 Personal history of non-Hodgkin lymphomas: Secondary | ICD-10-CM | POA: Diagnosis not present

## 2020-02-13 DIAGNOSIS — H43813 Vitreous degeneration, bilateral: Secondary | ICD-10-CM | POA: Diagnosis not present

## 2020-02-13 DIAGNOSIS — H401122 Primary open-angle glaucoma, left eye, moderate stage: Secondary | ICD-10-CM | POA: Diagnosis not present

## 2020-02-13 DIAGNOSIS — M19011 Primary osteoarthritis, right shoulder: Secondary | ICD-10-CM | POA: Diagnosis not present

## 2020-02-13 DIAGNOSIS — G9009 Other idiopathic peripheral autonomic neuropathy: Secondary | ICD-10-CM | POA: Diagnosis not present

## 2020-02-13 DIAGNOSIS — M6281 Muscle weakness (generalized): Secondary | ICD-10-CM | POA: Diagnosis not present

## 2020-02-13 DIAGNOSIS — M19012 Primary osteoarthritis, left shoulder: Secondary | ICD-10-CM | POA: Diagnosis not present

## 2020-02-13 DIAGNOSIS — H3554 Dystrophies primarily involving the retinal pigment epithelium: Secondary | ICD-10-CM | POA: Diagnosis not present

## 2020-02-13 DIAGNOSIS — H04123 Dry eye syndrome of bilateral lacrimal glands: Secondary | ICD-10-CM | POA: Diagnosis not present

## 2020-02-13 DIAGNOSIS — H11433 Conjunctival hyperemia, bilateral: Secondary | ICD-10-CM | POA: Diagnosis not present

## 2020-02-13 DIAGNOSIS — H5022 Vertical strabismus, left eye: Secondary | ICD-10-CM | POA: Diagnosis not present

## 2020-02-13 DIAGNOSIS — Z961 Presence of intraocular lens: Secondary | ICD-10-CM | POA: Diagnosis not present

## 2020-02-13 DIAGNOSIS — H401111 Primary open-angle glaucoma, right eye, mild stage: Secondary | ICD-10-CM | POA: Diagnosis not present

## 2020-02-25 DIAGNOSIS — L219 Seborrheic dermatitis, unspecified: Secondary | ICD-10-CM | POA: Diagnosis not present

## 2020-03-15 DIAGNOSIS — M6281 Muscle weakness (generalized): Secondary | ICD-10-CM | POA: Diagnosis not present

## 2020-03-15 DIAGNOSIS — G9009 Other idiopathic peripheral autonomic neuropathy: Secondary | ICD-10-CM | POA: Diagnosis not present

## 2020-03-15 DIAGNOSIS — M19012 Primary osteoarthritis, left shoulder: Secondary | ICD-10-CM | POA: Diagnosis not present

## 2020-03-15 DIAGNOSIS — M19011 Primary osteoarthritis, right shoulder: Secondary | ICD-10-CM | POA: Diagnosis not present

## 2020-04-14 DIAGNOSIS — G9009 Other idiopathic peripheral autonomic neuropathy: Secondary | ICD-10-CM | POA: Diagnosis not present

## 2020-04-14 DIAGNOSIS — M19011 Primary osteoarthritis, right shoulder: Secondary | ICD-10-CM | POA: Diagnosis not present

## 2020-04-14 DIAGNOSIS — M19012 Primary osteoarthritis, left shoulder: Secondary | ICD-10-CM | POA: Diagnosis not present

## 2020-04-14 DIAGNOSIS — M6281 Muscle weakness (generalized): Secondary | ICD-10-CM | POA: Diagnosis not present

## 2020-05-15 DIAGNOSIS — M19012 Primary osteoarthritis, left shoulder: Secondary | ICD-10-CM | POA: Diagnosis not present

## 2020-05-15 DIAGNOSIS — M6281 Muscle weakness (generalized): Secondary | ICD-10-CM | POA: Diagnosis not present

## 2020-05-15 DIAGNOSIS — G9009 Other idiopathic peripheral autonomic neuropathy: Secondary | ICD-10-CM | POA: Diagnosis not present

## 2020-05-15 DIAGNOSIS — M19011 Primary osteoarthritis, right shoulder: Secondary | ICD-10-CM | POA: Diagnosis not present

## 2020-06-11 ENCOUNTER — Other Ambulatory Visit: Payer: Self-pay

## 2020-06-11 ENCOUNTER — Encounter: Payer: Self-pay | Admitting: Family

## 2020-06-11 ENCOUNTER — Ambulatory Visit (INDEPENDENT_AMBULATORY_CARE_PROVIDER_SITE_OTHER): Payer: PPO | Admitting: Family

## 2020-06-11 VITALS — BP 152/71 | HR 82 | Temp 98.5°F | Resp 16 | Ht 65.0 in | Wt 167.2 lb

## 2020-06-11 DIAGNOSIS — K219 Gastro-esophageal reflux disease without esophagitis: Secondary | ICD-10-CM | POA: Diagnosis not present

## 2020-06-11 DIAGNOSIS — I480 Paroxysmal atrial fibrillation: Secondary | ICD-10-CM | POA: Diagnosis not present

## 2020-06-11 DIAGNOSIS — R2689 Other abnormalities of gait and mobility: Secondary | ICD-10-CM | POA: Diagnosis not present

## 2020-06-11 DIAGNOSIS — Z853 Personal history of malignant neoplasm of breast: Secondary | ICD-10-CM | POA: Diagnosis not present

## 2020-06-11 DIAGNOSIS — I8390 Asymptomatic varicose veins of unspecified lower extremity: Secondary | ICD-10-CM

## 2020-06-11 DIAGNOSIS — I8392 Asymptomatic varicose veins of left lower extremity: Secondary | ICD-10-CM | POA: Insufficient documentation

## 2020-06-11 DIAGNOSIS — I10 Essential (primary) hypertension: Secondary | ICD-10-CM | POA: Diagnosis not present

## 2020-06-11 DIAGNOSIS — R1013 Epigastric pain: Secondary | ICD-10-CM | POA: Diagnosis not present

## 2020-06-11 DIAGNOSIS — G4700 Insomnia, unspecified: Secondary | ICD-10-CM

## 2020-06-11 DIAGNOSIS — H409 Unspecified glaucoma: Secondary | ICD-10-CM | POA: Diagnosis not present

## 2020-06-11 HISTORY — DX: Asymptomatic varicose veins of left lower extremity: I83.92

## 2020-06-11 LAB — COMPREHENSIVE METABOLIC PANEL
ALT: 12 U/L (ref 0–35)
AST: 11 U/L (ref 0–37)
Albumin: 4.3 g/dL (ref 3.5–5.2)
Alkaline Phosphatase: 51 U/L (ref 39–117)
BUN: 23 mg/dL (ref 6–23)
CO2: 31 mEq/L (ref 19–32)
Calcium: 9.6 mg/dL (ref 8.4–10.5)
Chloride: 99 mEq/L (ref 96–112)
Creatinine, Ser: 0.54 mg/dL (ref 0.40–1.20)
GFR: 105.47 mL/min (ref >60.00)
Glucose, Bld: 86 mg/dL (ref 70–99)
Potassium: 4.4 mEq/L (ref 3.5–5.1)
Sodium: 136 mEq/L (ref 135–145)
Total Bilirubin: 0.5 mg/dL (ref 0.2–1.2)
Total Protein: 7 g/dL (ref 6.0–8.3)

## 2020-06-11 LAB — LIPASE: Lipase: 27 U/L (ref 11.0–59.0)

## 2020-06-11 MED ORDER — LORAZEPAM 1 MG PO TABS: 1.0000 mg | ORAL_TABLET | Freq: Every day | ORAL | 0 refills | Status: DC

## 2020-06-11 MED ORDER — IRBESARTAN-HYDROCHLOROTHIAZIDE 150-12.5 MG PO TABS: 1.0000 | ORAL_TABLET | Freq: Every day | ORAL | 1 refills | Status: DC

## 2020-06-11 MED ORDER — OMEPRAZOLE 40 MG PO CPDR
40.0000 mg | DELAYED_RELEASE_CAPSULE | Freq: Every day | ORAL | 3 refills | Status: DC
Start: 2020-06-11 — End: 2021-01-09

## 2020-06-11 NOTE — Progress Notes (Signed)
Subjective:    Patient ID: Carla Chandler, female    DOB: 1925-12-19, 86 y.o.   MRN: [id number]  HPI  Patient is a 86 yr old female who presents today to establish care.   pmhx is significant for the following:  HTN- BP meds include avalide and verapamil. Feels dizzy with standing.   BP Readings from Last 3 Encounters:  06/11/20 110/67   Idiopathic peripheral neuropathy  CNVI palsy- left- reports that last time she went ophthalmologist said that she did not see this.  Sees Dr. Bea Laura.   Breast CA- 2008, had radioactive seed implants and lumpectomy.  She has discontinued mammograms.    B-cell Lymphoma- radiation, refused chemotherapy. Follows with Dr. Verner Chol.   Anxiety- she thinks that she has more anxiety than depression.  Worries a lot.  Tries to stay active.  Maintained on citalopram. Has a lot of support from her children.  Uses lorazepam at night. This helps her to sleep.    Glaucoma- This is followed by Dr. Antionette Fairy.  PAF- She does not have a cardiologist.  Reports that she takes asa 81 mg "sometimes." She is maintained on calan-SR 120mg .   Painful varicose veins.    She reports that she often feels light headed. Uses a walker.  Reports that she is unbalanced.  Reports that this has been present for 1 year. Declines PT.    Reports + gerd symptoms and some epigastric discomfort  Review of Systems   Past Medical History:  Diagnosis Date  . Anxiety   . Breast cancer ([place])    right  . Hyperlipidemia   . Hypertension      Social History   Socioeconomic History  . Marital status: Widowed    Spouse name: Not on file  . Number of children: Not on file  . Years of education: Not on file  . Highest education level: Not on file  Occupational History  . Occupation: Retired  Tobacco Use  . Smoking status: Former Research scientist (life sciences)  . Smokeless tobacco: Never Used  Vaping Use  . Vaping Use: Never used  Substance and Sexual Activity  . Alcohol use:  Yes    Alcohol/week: 1.0 standard drink    Types: 1 Glasses of wine per week    Comment: wine 3 x per week  . Drug use: Never  . Sexual activity: Not Currently  Other Topics Concern  . 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 Difficulty of Paying Living Expenses:   Food Insecurity:   . Worried About Charity fundraiser in the Last Year:   . Arboriculturist in the Last Year:   Transportation Needs:   . Film/video editor (Medical):   Marland Kitchen Lack of Transportation (Non-Medical):   Physical Activity:   . Days of Exercise per Week:   . Minutes of Exercise per Session:   Stress:   . Feeling of Stress :   Social Connections:   . Frequency of Communication with Friends and  Family:   . Frequency of Social Gatherings with Friends and Family:   . Attends Religious Services:   . Active Member of Clubs or Organizations:   . Attends Archivist Meetings:   Marland Kitchen Marital Status:   Intimate Partner Violence:   . Fear of Current or Ex-Partner:   . Emotionally Abused:   Marland Kitchen Physically Abused:   . Sexually Abused:       Family History  Problem Relation Age of Onset  . Lymphoma Sister   . Aneurysm Brother   . Cancer Sister   . Stroke Sister     Allergies  Allergen Reactions  . Bimatoprost Other (See Comments)    toxicity  . Levofloxacin Diarrhea  . Penicillins Itching  . Atorvastatin Other (See Comments)    unknown unknown   . Brimonidine Other (See Comments)    Unknown Unknown Unknown   . Dorzolamide Hcl-Timolol Mal Other (See Comments)    unknown unknown   . Lovastatin Other (See Comments)     unknown unknown   . Perindopril Other (See Comments)    unknown unknown     Current Outpatient Medications on File Prior to Visit  Medication Sig Dispense Refill  . aspirin 81 MG EC tablet Take by mouth.    Marland Kitchen b complex vitamins capsule Take by mouth.    . citalopram (CELEXA) 20 MG tablet Take 20 mg by mouth daily.    . irbesartan-hydrochlorothiazide (AVALIDE) 150-12.5 MG tablet Take 1 tablet by mouth daily.    Marland Kitchen LORazepam (ATIVAN) 1 MG tablet Take 1 mg by mouth at bedtime.    . verapamil (CALAN-SR) 120 MG CR tablet Take by mouth.    Marland Kitchen ZIOPTAN 0.0015 % SOLN      No current facility-administered medications on file prior to visit.    BP 110/67 (BP Location: Right Arm, Patient Position: Sitting, Cuff Size: Small)   Pulse 79   Temp 98.5 F (36.9 C) (Oral)   Resp 16   Ht 5\' 5"  (1.651 m)   Wt 167 lb 3.2 oz (75.8 kg)   SpO2 95%   BMI 27.82 kg/m       Objective:   Physical Exam Constitutional:      Appearance: She is well-developed.     Comments: Appears younger than stated age Seated in wheelchair.   Cardiovascular:     Rate and Rhythm: Normal rate and regular rhythm.     Heart sounds: Normal heart sounds. No murmur heard.      Comments: Bilateral LE varicose veins noted Pulmonary:     Effort: Pulmonary effort is normal. No respiratory distress.     Breath sounds: Normal breath sounds. No wheezing.  Psychiatric:        Behavior: Behavior normal.        Thought Content: Thought content normal.        Judgment: Judgment normal.           Assessment & Plan:  Varicose Veins- rx provided for knee high compression hose 15-53mmHg  Anxiety- fair control on citalopram. Continue same.   Hx of breast CA- s/p radiation and lumpectomy.    PAF- clinically stable. Will refer to cardiology for consultation.  Insomnia- using ativan HS- has been on this for many years. Will obtain UDS, controlled substance contract is signed.  HTN- BP is acceptable for her age. We did  check orthostatics today and they were negative.  BP Readings from Last 3 Encounters:  06/11/20 (!) 152/71  Glaucoma- stable, managed by ophthalmology.  Poor Balance-  Offered PT, she declines. Continue walker.  GERD- will give trial of PPI.  If epigastric discomfort continues, consider GI evaluation  50 minutes spent on today's visit- reviewing outside records, interviewing patient and formulating medical plan.  This visit occurred during the SARS-CoV-2 public health emergency.  Safety protocols were in place, including screening questions prior to the visit, additional usage of staff PPE, and extensive cleaning of exam room while observing appropriate contact time as indicated for disinfecting solutions.

## 2020-06-11 NOTE — Patient Instructions (Addendum)
Please begin omeprazole (acid medication) once daily to see if it helps with your reflux symptoms and abdominal discomfort. Try wearing support hose to help with pain from your varicose veins.  You can bring the prescription to [facility] at:   [address] [place], [place], [place] [address] Hours:  Open ? Closes 5:30PM Phone: [phone]  You should be contacted about scheduling your appointment with cardiology. Welcome to [facility]!

## 2020-06-12 DIAGNOSIS — Z853 Personal history of malignant neoplasm of breast: Secondary | ICD-10-CM | POA: Insufficient documentation

## 2020-06-12 DIAGNOSIS — I480 Paroxysmal atrial fibrillation: Secondary | ICD-10-CM

## 2020-06-12 DIAGNOSIS — G4700 Insomnia, unspecified: Secondary | ICD-10-CM | POA: Insufficient documentation

## 2020-06-12 DIAGNOSIS — R2689 Other abnormalities of gait and mobility: Secondary | ICD-10-CM

## 2020-06-12 DIAGNOSIS — K219 Gastro-esophageal reflux disease without esophagitis: Secondary | ICD-10-CM | POA: Insufficient documentation

## 2020-06-12 DIAGNOSIS — H409 Unspecified glaucoma: Secondary | ICD-10-CM | POA: Insufficient documentation

## 2020-06-12 HISTORY — DX: Personal history of malignant neoplasm of breast: Z85.3

## 2020-06-12 HISTORY — DX: Gastro-esophageal reflux disease without esophagitis: K21.9

## 2020-06-12 HISTORY — DX: Paroxysmal atrial fibrillation: I48.0

## 2020-06-12 HISTORY — DX: Unspecified glaucoma: H40.9

## 2020-06-12 HISTORY — DX: Other abnormalities of gait and mobility: R26.89

## 2020-06-12 HISTORY — DX: Insomnia, unspecified: G47.00

## 2020-06-14 DIAGNOSIS — M19011 Primary osteoarthritis, right shoulder: Secondary | ICD-10-CM | POA: Diagnosis not present

## 2020-06-14 DIAGNOSIS — G9009 Other idiopathic peripheral autonomic neuropathy: Secondary | ICD-10-CM | POA: Diagnosis not present

## 2020-06-14 DIAGNOSIS — M6281 Muscle weakness (generalized): Secondary | ICD-10-CM | POA: Diagnosis not present

## 2020-06-14 DIAGNOSIS — M19012 Primary osteoarthritis, left shoulder: Secondary | ICD-10-CM | POA: Diagnosis not present

## 2020-06-14 LAB — DRUG MONITORING, PANEL 8 WITH CONFIRMATION, URINE
6 Acetylmorphine: NEGATIVE ng/mL (ref <10)
Alcohol Metabolites: POSITIVE ng/mL — AB
Alphahydroxyalprazolam: NEGATIVE ng/mL (ref <25)
Alphahydroxymidazolam: NEGATIVE ng/mL (ref <50)
Alphahydroxytriazolam: NEGATIVE ng/mL (ref <50)
Aminoclonazepam: NEGATIVE ng/mL (ref <25)
Amphetamines: NEGATIVE ng/mL (ref <500)
Benzodiazepines: POSITIVE ng/mL — AB (ref <100)
Buprenorphine, Urine: NEGATIVE ng/mL (ref <5)
Cocaine Metabolite: NEGATIVE ng/mL (ref <150)
Creatinine: 82.2 mg/dL
Ethyl Glucuronide (ETG): 1216 ng/mL — ABNORMAL HIGH (ref <500)
Ethyl Sulfate (ETS): 245 ng/mL — ABNORMAL HIGH (ref <100)
Hydroxyethylflurazepam: NEGATIVE ng/mL (ref <50)
Lorazepam: 839 ng/mL — ABNORMAL HIGH (ref <50)
MDMA: NEGATIVE ng/mL (ref <500)
Marijuana Metabolite: 12 ng/mL — ABNORMAL HIGH (ref <5)
Marijuana Metabolite: POSITIVE ng/mL — AB (ref <20)
Nordiazepam: NEGATIVE ng/mL (ref <50)
Opiates: NEGATIVE ng/mL (ref <100)
Oxazepam: NEGATIVE ng/mL (ref <50)
Oxidant: NEGATIVE ug/mL
Oxycodone: NEGATIVE ng/mL (ref <100)
Temazepam: NEGATIVE ng/mL (ref <50)
pH: 5.3 (ref 4.5–9.0)

## 2020-06-14 LAB — DM TEMPLATE

## 2020-06-16 ENCOUNTER — Telehealth: Payer: Self-pay | Admitting: Family

## 2020-06-16 NOTE — Telephone Encounter (Signed)
Please advise pt that I reviewed her lab work. Urine drug screening is showing alcohol. It is not safe for her to mix alcohol with her lorazepam.  Please d/c alcohol if she is going to continue lorazepam. Also, is she taking any CBD products?

## 2020-06-16 NOTE — Telephone Encounter (Signed)
Patient has been advised to d/c alcohol and CBD if she will like to continue the Ativan. She verbalized understanding.

## 2020-06-16 NOTE — Telephone Encounter (Signed)
If she would like to continue ativan I would recommend that she d/c alcohol and CBD please as this is our protocol.

## 2020-06-16 NOTE — Telephone Encounter (Signed)
Patient reports she takes "a glass of wine 3-4 times per week and she also started using CBD sublingual drops.   Patient advised to dc alcohol while taking the lorazepam.

## 2020-07-11 ENCOUNTER — Ambulatory Visit (INDEPENDENT_AMBULATORY_CARE_PROVIDER_SITE_OTHER): Payer: PPO | Admitting: Family

## 2020-07-11 ENCOUNTER — Encounter: Payer: Self-pay | Admitting: Cardiology

## 2020-07-11 ENCOUNTER — Encounter: Payer: Self-pay | Admitting: Family

## 2020-07-11 ENCOUNTER — Other Ambulatory Visit: Payer: Self-pay

## 2020-07-11 ENCOUNTER — Ambulatory Visit: Payer: PPO | Admitting: Cardiology

## 2020-07-11 VITALS — BP 131/51 | HR 61 | Temp 98.6°F | Resp 16 | Ht 65.0 in | Wt 164.0 lb

## 2020-07-11 VITALS — BP 120/64 | HR 96 | Ht 65.0 in | Wt 166.0 lb

## 2020-07-11 DIAGNOSIS — R011 Cardiac murmur, unspecified: Secondary | ICD-10-CM

## 2020-07-11 DIAGNOSIS — I10 Essential (primary) hypertension: Secondary | ICD-10-CM

## 2020-07-11 DIAGNOSIS — I480 Paroxysmal atrial fibrillation: Secondary | ICD-10-CM | POA: Insufficient documentation

## 2020-07-11 DIAGNOSIS — I8392 Asymptomatic varicose veins of left lower extremity: Secondary | ICD-10-CM | POA: Diagnosis not present

## 2020-07-11 DIAGNOSIS — K219 Gastro-esophageal reflux disease without esophagitis: Secondary | ICD-10-CM | POA: Diagnosis not present

## 2020-07-11 HISTORY — DX: Paroxysmal atrial fibrillation: I48.0

## 2020-07-11 HISTORY — DX: Cardiac murmur, unspecified: R01.1

## 2020-07-11 NOTE — Progress Notes (Signed)
Subjective:    Patient ID: Carla Chandler, female    DOB: [birth year], 86 y.o.   MRN: [id number]  HPI  Patient is a 86 yr old female who presents today for follow up.  Varicose veins- last visit we gave her an rx for compression hose.   She obtained these stockings.  Has trouble pulling them up but family members help her.    PAF- last visit she was referred to cardiology. She sees them this afternoon.   GERD- last visit we gave a trial of PPI due to epigastric discomfort. Reports that "some days it helps and some days it doesn't."  She declines GI referral.   BP Readings from Last 3 Encounters:  07/11/20 (!) 131/51  06/11/20 (!) 152/71     Review of Systems See HPI  Past Medical History:  Diagnosis Date  . Anxiety   . Breast cancer ([place])    right  . Hyperlipidemia   . Hypertension      Social History   Socioeconomic History  . Marital status: Widowed    Spouse name: Not on file  . Number of children: Not on file  . Years of education: Not on file  . Highest education level: Not on file  Occupational History  . Occupation: Retired  Tobacco Use  . Smoking status: Former Research scientist (life sciences)  . Smokeless tobacco: Never Used  Vaping Use  . Vaping Use: Never used  Substance and Sexual Activity  . Alcohol use: Yes    Alcohol/week: 1.0 standard drink    Types: 1 Glasses of wine per week    Comment: wine 3 x per week  . Drug use: Never  . Sexual activity: Not Currently  Other Topics Concern  . 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 Difficulty of Paying Living Expenses: Not on file  Food Insecurity:   .  Worried About Charity fundraiser in the Last Year: Not on file  . Ran Out of Food in the Last Year: Not on file  Transportation Needs:   . Lack of Transportation (Medical): Not on file  . Lack of Transportation (Non-Medical): Not on file  Physical Activity:   . Days of Exercise per Week: Not on file  . Minutes of Exercise per Session: Not on file  Stress:   . Feeling of Stress : Not on file  Social Connections:   . Frequency of Communication with Friends and Family: Not on file  . Frequency of Social Gatherings with Friends and Family: Not on file  . Attends Religious Services: Not on file  . Active Member of Clubs or Organizations: Not on file  . Attends Archivist Meetings: Not on file  . Marital Status: Not on file  Intimate Partner Violence:   . Fear of Current or Ex-Partner: Not on file  . Emotionally Abused: Not on file  . Physically Abused: Not on file  . Sexually Abused: Not on file      Family History  Problem  Relation Age of Onset  . Lymphoma Sister   . Aneurysm Brother   . Cancer Sister   . Stroke Sister     Allergies  Allergen Reactions  . Bimatoprost Other (See Comments)    toxicity  . Levofloxacin Diarrhea  . Penicillins Itching  . Atorvastatin Other (See Comments)    unknown unknown   . Brimonidine Other (See Comments)    Unknown Unknown Unknown   . Dorzolamide Hcl-Timolol Mal Other (See Comments)    unknown unknown   . Lovastatin Other (See Comments)    unknown unknown   . Perindopril Other (See Comments)    unknown unknown     Current Outpatient Medications on File Prior to Visit  Medication Sig Dispense Refill  . aspirin 81 MG EC tablet Take by mouth.    Marland Kitchen b complex vitamins capsule Take by mouth.    . citalopram (CELEXA) 20 MG tablet Take 20 mg by mouth daily.    . irbesartan-hydrochlorothiazide (AVALIDE) 150-12.5 MG tablet Take 1 tablet by mouth daily. 90 tablet 1  . LORazepam (ATIVAN) 1 MG tablet Take 1 tablet (1 mg  total) by mouth at bedtime. 90 tablet 0  . omeprazole (PRILOSEC) 40 MG capsule Take 1 capsule (40 mg total) by mouth daily. 30 capsule 3  . verapamil (CALAN-SR) 120 MG CR tablet Take by mouth.    Marland Kitchen ZIOPTAN 0.0015 % SOLN      No current facility-administered medications on file prior to visit.    BP (!) 131/51 (BP Location: Right Arm, Patient Position: Sitting, Cuff Size: Small)   Pulse 61   Temp 98.6 F (37 C) (Oral)   Resp 16   Ht 5\' 5"  (1.651 m)   Wt 164 lb (74.4 kg)   SpO2 94%   BMI 27.29 kg/m       Objective:   Physical Exam Constitutional:      Appearance: She is well-developed.  Neck:     Thyroid: No thyromegaly.  Cardiovascular:     Rate and Rhythm: Normal rate and regular rhythm.     Heart sounds: Murmur heard.  Systolic murmur is present with a grade of 1/6.   Pulmonary:     Effort: Pulmonary effort is normal. No respiratory distress.     Breath sounds: Normal breath sounds. No wheezing.  Abdominal:     General: Bowel sounds are normal. There is no distension.     Palpations: Abdomen is soft.     Tenderness: There is no abdominal tenderness.  Musculoskeletal:     Cervical back: Neck supple.  Skin:    General: Skin is warm and dry.  Neurological:     Mental Status: She is alert and oriented to person, place, and time.  Psychiatric:        Behavior: Behavior normal.        Thought Content: Thought content normal.        Judgment: Judgment normal.           Assessment & Plan:  HTN- bp stable on current regimen, continue same.  GERD- fair control on PPI.  We discussed GI referral and further evaluation of her gallbladder- she declines at this time but will let me know if symptoms worsen or she changes her mind. She does not think she would pursue surgery at her age unless it was absolutely necessary.  PAF- rate stable. Has appointment with cardiology this afternoon.  Varicose veins- she is pleased with the compression hose. Continue.  BP  Readings  from Last 3 Encounters:  07/11/20 (!) 131/51  06/11/20 (!) 152/71

## 2020-07-11 NOTE — Progress Notes (Signed)
Cardiology Office Note:    Date:  07/11/2020   ID:  CHYANE GREER, DOB [birth year], MRN [id number]  PCP:  [name], NP  Cardiologist:  Berniece Salines, DO  Electrophysiologist:  None   Referring MD: [name], NP   Chief Complaint  Patient presents with  . Irregular Heart Beat    History of Present Illness:    Carla Chandler is a 86 y.o. female with a hx of asthma atrial fibrillation which was diagnosed over 20 years ago the patient tells me that her initial diagnosis was [place] was at the beach she started experience significant palpitation she was taken to the emergency department she was told she has A. fib.  She did went back into sinus rhythm.  Since that time she has not really had any episode that was told that she was having A. fib.  She has been on aspirin since that time they had not discussed any anticoagulation use.  But she tells me she really is not in favor use of anticoagulant giving her propensity to bleed based on her thin skin.  She also has been treated for hypertension. She is here today at her visit with her daughter Carla Chandler.  She denies any chest pain, lightheadedness or dizziness.  Or shortness of breath.  Past Medical History:  Diagnosis Date  . Anxiety   . Breast cancer ([place])    right  . Hyperlipidemia   . Hypertension     Past Surgical History:  Procedure Laterality Date  . BLADDER SUSPENSION    . BREAST SURGERY Right 2008   lumpectomy  . LAPAROSCOPIC HYSTERECTOMY    . LYMPHADENECTOMY Right 2016    Current Medications: Current Meds  Medication Sig  . aspirin 81 MG EC tablet Take by mouth.  Marland Kitchen b complex vitamins capsule Take by mouth.  . citalopram (CELEXA) 20 MG tablet Take 20 mg by mouth daily.  . irbesartan-hydrochlorothiazide (AVALIDE) 150-12.5 MG tablet Take 1 tablet by mouth daily.  Marland Kitchen LORazepam (ATIVAN) 1 MG tablet Take 1 tablet (1 mg total) by mouth at bedtime.  Marland Kitchen omeprazole (PRILOSEC) 40 MG capsule Take 1  capsule (40 mg total) by mouth daily.  . verapamil (CALAN-SR) 120 MG CR tablet Take by mouth.  Marland Kitchen ZIOPTAN 0.0015 % SOLN      Allergies:   Bimatoprost, Ibuprofen, Levofloxacin, Penicillins, Statins, Atorvastatin, Brimonidine, Dorzolamide hcl-timolol mal, Lovastatin, and Perindopril   Social History   Socioeconomic History  . Marital status: Widowed    Spouse name: Not on file  . Number of children: Not on file  . Years of education: Not on file  . Highest education level: Not on file  Occupational History  . Occupation: Retired  Tobacco Use  . Smoking status: Former Research scientist (life sciences)  . Smokeless tobacco: Never Used  Vaping Use  . Vaping Use: Never used  Substance and Sexual Activity  . Alcohol use: Yes    Alcohol/week: 3.0 standard drinks    Types: 3 Glasses of wine per week    Comment: wine 3 x per week  . Drug use: Never  . Sexual activity: Not Currently  Other Topics Concern  . 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 Difficulty of Paying Living Expenses: Not on file  Food Insecurity:   . Worried About Charity fundraiser in the Last Year: Not on file  . Ran Out of Food in the Last Year: Not on file  Transportation Needs:   . Lack of Transportation (Medical): Not on file  . Lack of Transportation (Non-Medical): Not on file  Physical Activity:   . Days of Exercise per Week: Not on file  . Minutes of Exercise per Session: Not on file  Stress:   . Feeling of Stress : Not on file  Social Connections:   . Frequency of Communication with Friends and Family: Not on file  . Frequency of Social Gatherings with Friends and  Family: Not on file  . Attends Religious Services: Not on file  . Active Member of Clubs or Organizations: Not on file  . Attends Archivist Meetings: Not on file  . Marital Status: Not on file     Family History: The patient's family history includes Aneurysm in her brother; Cancer in her sister; Heart attack in her father; Lymphoma in her sister; Stroke in her sister.  ROS:   Review of Systems  Constitution: Negative for decreased appetite, fever and weight gain.  HENT: Negative for congestion, ear discharge, hoarse voice and sore throat.   Eyes: Negative for discharge, redness, vision loss in right eye and visual halos.  Cardiovascular: Negative for chest pain, dyspnea on exertion, leg swelling, orthopnea and palpitations.  Respiratory: Negative for cough, hemoptysis, shortness of breath and snoring.   Endocrine: Negative for heat intolerance and polyphagia.  Hematologic/Lymphatic: Negative for bleeding problem. Does not bruise/bleed easily.  Skin: Negative for flushing, nail changes, rash and suspicious lesions.  Musculoskeletal: Negative for arthritis, joint pain, muscle cramps, myalgias, neck pain and stiffness.  Gastrointestinal: Negative for abdominal pain, bowel incontinence, diarrhea and excessive appetite.  Genitourinary: Negative for decreased libido, genital sores and incomplete emptying.  Neurological: Negative for brief paralysis, focal weakness, headaches and loss of balance.  Psychiatric/Behavioral: Negative for altered mental status, depression and suicidal ideas.  Allergic/Immunologic: Negative for HIV exposure and persistent infections.    EKGs/Labs/Other Studies Reviewed:    The following studies were reviewed today:   EKG:  The ekg ordered today demonstrates sinus rhythm, heart rate 92 bpm with PACs.  No prior EKG for comparison.  Recent Labs: 06/11/2020: ALT 12; BUN 23; Creatinine, Ser 0.54; Potassium 4.4; Sodium 136  Recent Lipid Panel No results  found for: CHOL, TRIG, HDL, CHOLHDL, VLDL, LDLCALC, LDLDIRECT  Physical Exam:    VS:  BP 120/64 (BP Location: Left Arm, Patient Position: Sitting, Cuff Size: Normal)   Pulse 96   Ht 5\' 5"  (1.651 m)   Wt 166 lb (75.3 kg)   SpO2 93%   BMI 27.62 kg/m     Wt Readings from Last 3 Encounters:  07/11/20 166 lb (75.3 kg)  07/11/20 164 lb (74.4 kg)  06/11/20 167 lb 3.2 oz (75.8 kg)     GEN: Well nourished, well developed in no acute distress HEENT: Normal NECK: No JVD; No carotid bruits LYMPHATICS: No lymphadenopathy CARDIAC: S1S2 noted,RRR, no murmurs, rubs, gallops RESPIRATORY:  Clear to auscultation without rales, wheezing or rhonchi  ABDOMEN: Soft, non-tender, non-distended, +bowel sounds, no guarding. EXTREMITIES: No edema, No  cyanosis, no clubbing MUSCULOSKELETAL:  No deformity  SKIN: Warm and dry NEUROLOGIC:  Alert and oriented x 3, non-focal PSYCHIATRIC:  Normal affect, good insight  ASSESSMENT:    1. PAF (paroxysmal atrial fibrillation) ([place])   2. Murmur, cardiac   3. Essential hypertension    PLAN:     There is history of paroxysmal atrial fibrillation-the patient is in sinus rhythm today.  She has never been on any antiarrhythmics or any anticoagulation.  She prefers to stay on aspirin 81 mg daily as well as she is on verapamil 120 mg which controlling her heart rate.   She has a mid ejection patient is solid murmur which is concerning for aortic stenosis.  I am going to get a echocardiogram to assess for the severity of this suspected lesion.  Also be able to understand what her LV function is.  She may benefit from wearing a 2-week ZIO monitor to understand if there is any A. fib burden.  But for now we discussed get the echocardiogram first.  The patient is in agreement with the above plan. The patient left the office in stable condition.  The patient will follow up in 3 months or sooner if needed.   Medication Adjustments/Labs and Tests Ordered: Current  medicines are reviewed at length with the patient today.  Concerns regarding medicines are outlined above.  Orders Placed This Encounter  Procedures  . EKG 12-Lead  . ECHOCARDIOGRAM COMPLETE   No orders of the defined types were placed in this encounter.   Patient Instructions  Medication Instructions:  No medication changes. *If you need a refill on your cardiac medications before your next appointment, please call your pharmacy*   Lab Work: None ordered If you have labs (blood work) drawn today and your tests are completely normal, you will receive your results only by: Marland Kitchen MyChart Message (if you have MyChart) OR . A paper copy in the mail If you have any lab test that is abnormal or we need to change your treatment, we will call you to review the results.   Testing/Procedures: Your physician has requested that you have an echocardiogram. Echocardiography is a painless test that uses sound waves to create images of your heart. It provides your doctor with information about the size and shape of your heart and how well your heart's chambers and valves are working. This procedure takes approximately one hour. There are no restrictions for this procedure.   Follow-Up: At [facility], you and your health needs are our priority.  As part of our continuing mission to provide you with exceptional heart care, we have created designated Provider Care Teams.  These Care Teams include your primary Cardiologist (physician) and Advanced Practice Providers (APPs -  Physician Assistants and Nurse Practitioners) who all work together to provide you with the care you need, when you need it.  We recommend signing up for the patient portal called "MyChart".  Sign up information is provided on this After Visit Summary.  MyChart is used to connect with patients for Virtual Visits (Telemedicine).  Patients are able to view lab/test results, encounter notes, upcoming appointments, etc.  Non-urgent  messages can be sent to your provider as well.   To learn more about what you can do with MyChart, go to [email or web address].    Your next appointment:   3 month(s)  The format for your next appointment:   In Person  Provider:   Berniece Salines, DO   Other Instructions NA  Adopting a Healthy Lifestyle.  Know what a healthy weight is for you (roughly BMI <25) and aim to maintain this   Aim for 7+ servings of fruits and vegetables daily   65-80+ fluid ounces of water or unsweet tea for healthy kidneys   Limit to max 1 drink of alcohol per day; avoid smoking/tobacco   Limit animal fats in diet for cholesterol and heart health - choose grass fed whenever available   Avoid highly processed foods, and foods high in saturated/trans fats   Aim for low stress - take time to unwind and care for your mental health   Aim for 150 min of moderate intensity exercise weekly for heart health, and weights twice weekly for bone health   Aim for 7-9 hours of sleep daily   When it comes to diets, agreement about the perfect plan isnt easy to find, even among the experts. Experts at the [employer] developed an idea known as the Healthy Eating Plate. Just imagine a plate divided into logical, healthy portions.   The emphasis is on diet quality:   Load up on vegetables and fruits - one-half of your plate: Aim for color and variety, and remember that potatoes dont count.   Go for whole grains - one-quarter of your plate: Whole wheat, barley, wheat berries, quinoa, oats, brown rice, and foods made with them. If you want pasta, go with whole wheat pasta.   Protein power - one-quarter of your plate: Fish, chicken, beans, and nuts are all healthy, versatile protein sources. Limit red meat.   The diet, however, does go beyond the plate, offering a few other suggestions.   Use healthy plant oils, such as olive, canola, soy, corn, sunflower and peanut. Check the labels,  and avoid partially hydrogenated oil, which have unhealthy trans fats.   If youre thirsty, drink water. Coffee and tea are good in moderation, but skip sugary drinks and limit milk and dairy products to one or two daily servings.   The type of carbohydrate in the diet is more important than the amount. Some sources of carbohydrates, such as vegetables, fruits, whole grains, and beans-are healthier than others.   Finally, stay active  Signed, Berniece Salines, DO  07/11/2020 4:39 PM    [facility]

## 2020-07-11 NOTE — Patient Instructions (Signed)
Medication Instructions:  No medication changes. *If you need a refill on your cardiac medications before your next appointment, please call your pharmacy*   Lab Work: None ordered If you have labs (blood work) drawn today and your tests are completely normal, you will receive your results only by: . MyChart Message (if you have MyChart) OR . A paper copy in the mail If you have any lab test that is abnormal or we need to change your treatment, we will call you to review the results.   Testing/Procedures: Your physician has requested that you have an echocardiogram. Echocardiography is a painless test that uses sound waves to create images of your heart. It provides your doctor with information about the size and shape of your heart and how well your heart's chambers and valves are working. This procedure takes approximately one hour. There are no restrictions for this procedure.     Follow-Up: At [facility], you and your health needs are our priority.  As part of our continuing mission to provide you with exceptional heart care, we have created designated Provider Care Teams.  These Care Teams include your primary Cardiologist (physician) and Advanced Practice Providers (APPs -  Physician Assistants and Nurse Practitioners) who all work together to provide you with the care you need, when you need it.  We recommend signing up for the patient portal called "MyChart".  Sign up information is provided on this After Visit Summary.  MyChart is used to connect with patients for Virtual Visits (Telemedicine).  Patients are able to view lab/test results, encounter notes, upcoming appointments, etc.  Non-urgent messages can be sent to your provider as well.   To learn more about what you can do with MyChart, go to [email or web address].    Your next appointment:   3 month(s)  The format for your next appointment:   In Person  Provider:   Kardie Tobb, DO   Other Instructions NA  

## 2020-07-15 DIAGNOSIS — M19012 Primary osteoarthritis, left shoulder: Secondary | ICD-10-CM | POA: Diagnosis not present

## 2020-07-15 DIAGNOSIS — G9009 Other idiopathic peripheral autonomic neuropathy: Secondary | ICD-10-CM | POA: Diagnosis not present

## 2020-07-15 DIAGNOSIS — M19011 Primary osteoarthritis, right shoulder: Secondary | ICD-10-CM | POA: Diagnosis not present

## 2020-07-15 DIAGNOSIS — M6281 Muscle weakness (generalized): Secondary | ICD-10-CM | POA: Diagnosis not present

## 2020-08-08 ENCOUNTER — Ambulatory Visit (HOSPITAL_BASED_OUTPATIENT_CLINIC_OR_DEPARTMENT_OTHER)
Admission: RE | Admit: 2020-08-08 | Discharge: 2020-08-08 | Disposition: A | Discharge status: Home / Self Care | Payer: PPO | Source: Ambulatory Visit | Attending: Cardiology | Admitting: Cardiology

## 2020-08-08 DIAGNOSIS — I480 Paroxysmal atrial fibrillation: Secondary | ICD-10-CM | POA: Diagnosis not present

## 2020-08-08 DIAGNOSIS — R011 Cardiac murmur, unspecified: Secondary | ICD-10-CM | POA: Diagnosis not present

## 2020-08-08 LAB — ECHOCARDIOGRAM COMPLETE
Area-P 1/2: 2.1 cm2
S' Lateral: 2.95 cm

## 2020-08-12 ENCOUNTER — Telehealth: Payer: Self-pay | Admitting: Cardiology

## 2020-08-12 NOTE — Telephone Encounter (Signed)
The patient has been notified of the result and verbalized understanding.  All questions (if any) were answered. Cleon Gustin, RN 08/12/2020 1:30 PM

## 2020-08-12 NOTE — Telephone Encounter (Signed)
Patient returning call for echo results. 

## 2020-08-12 NOTE — Telephone Encounter (Signed)
-----   Message from Berniece Salines, DO sent at 08/12/2020 11:42 AM EDT ----- The echo showed that the heart is not fully relaxing like it should ( diastolic dysfunction) ,but otherwise normal. I will discuss it at the next office visit.

## 2020-08-13 DIAGNOSIS — Z853 Personal history of malignant neoplasm of breast: Secondary | ICD-10-CM | POA: Diagnosis not present

## 2020-08-13 DIAGNOSIS — Z923 Personal history of irradiation: Secondary | ICD-10-CM | POA: Diagnosis not present

## 2020-08-13 DIAGNOSIS — C50911 Malignant neoplasm of unspecified site of right female breast: Secondary | ICD-10-CM | POA: Diagnosis not present

## 2020-08-13 DIAGNOSIS — C8330 Diffuse large B-cell lymphoma, unspecified site: Secondary | ICD-10-CM | POA: Diagnosis not present

## 2020-08-14 ENCOUNTER — Telehealth: Payer: Self-pay | Admitting: Family

## 2020-08-14 NOTE — Telephone Encounter (Signed)
Medication: citalopram (CELEXA) 20 MG tablet [[id number]]       Has the patient contacted their pharmacy?  (If no, request that the patient contact the pharmacy for the refill.) (If yes, when and what did the pharmacy advise?)     Preferred Pharmacy (with phone number or street name): [facility], [place] [address]  Phone:  [phone] Fax:  [phone]      Agent: Please be advised that RX refills may take up to 3 business days. We ask that you follow-up with your pharmacy.

## 2020-08-15 DIAGNOSIS — M19011 Primary osteoarthritis, right shoulder: Secondary | ICD-10-CM | POA: Diagnosis not present

## 2020-08-15 DIAGNOSIS — H4912 Fourth [trochlear] nerve palsy, left eye: Secondary | ICD-10-CM | POA: Diagnosis not present

## 2020-08-15 DIAGNOSIS — H401122 Primary open-angle glaucoma, left eye, moderate stage: Secondary | ICD-10-CM | POA: Diagnosis not present

## 2020-08-15 DIAGNOSIS — H524 Presbyopia: Secondary | ICD-10-CM | POA: Diagnosis not present

## 2020-08-15 DIAGNOSIS — Z961 Presence of intraocular lens: Secondary | ICD-10-CM | POA: Diagnosis not present

## 2020-08-15 DIAGNOSIS — G9009 Other idiopathic peripheral autonomic neuropathy: Secondary | ICD-10-CM | POA: Diagnosis not present

## 2020-08-15 DIAGNOSIS — M6281 Muscle weakness (generalized): Secondary | ICD-10-CM | POA: Diagnosis not present

## 2020-08-15 DIAGNOSIS — H401111 Primary open-angle glaucoma, right eye, mild stage: Secondary | ICD-10-CM | POA: Diagnosis not present

## 2020-08-15 DIAGNOSIS — H35363 Drusen (degenerative) of macula, bilateral: Secondary | ICD-10-CM | POA: Diagnosis not present

## 2020-08-15 DIAGNOSIS — H04123 Dry eye syndrome of bilateral lacrimal glands: Secondary | ICD-10-CM | POA: Diagnosis not present

## 2020-08-15 DIAGNOSIS — H43813 Vitreous degeneration, bilateral: Secondary | ICD-10-CM | POA: Diagnosis not present

## 2020-08-15 DIAGNOSIS — H5203 Hypermetropia, bilateral: Secondary | ICD-10-CM | POA: Diagnosis not present

## 2020-08-15 DIAGNOSIS — H52203 Unspecified astigmatism, bilateral: Secondary | ICD-10-CM | POA: Diagnosis not present

## 2020-08-15 DIAGNOSIS — M19012 Primary osteoarthritis, left shoulder: Secondary | ICD-10-CM | POA: Diagnosis not present

## 2020-08-15 MED ORDER — CITALOPRAM HYDROBROMIDE 20 MG PO TABS: 20.0000 mg | ORAL_TABLET | Freq: Every day | ORAL | 1 refills | Status: DC

## 2020-08-15 NOTE — Telephone Encounter (Signed)
Rx has been sent to [facility] on [address].

## 2020-08-15 NOTE — Telephone Encounter (Signed)
Patient asking for refill on this medication, used to get it from her previous pcp.

## 2020-08-15 NOTE — Telephone Encounter (Signed)
Patient advised rx was sent. ?

## 2020-09-14 DIAGNOSIS — G9009 Other idiopathic peripheral autonomic neuropathy: Secondary | ICD-10-CM | POA: Diagnosis not present

## 2020-09-14 DIAGNOSIS — M19011 Primary osteoarthritis, right shoulder: Secondary | ICD-10-CM | POA: Diagnosis not present

## 2020-09-14 DIAGNOSIS — M6281 Muscle weakness (generalized): Secondary | ICD-10-CM | POA: Diagnosis not present

## 2020-09-14 DIAGNOSIS — M19012 Primary osteoarthritis, left shoulder: Secondary | ICD-10-CM | POA: Diagnosis not present

## 2020-09-17 ENCOUNTER — Telehealth: Payer: Self-pay | Admitting: Family

## 2020-09-17 ENCOUNTER — Other Ambulatory Visit: Payer: Self-pay | Admitting: Family

## 2020-09-17 NOTE — Telephone Encounter (Signed)
Caller : [facility]  Call Back [phone] Fax Number # [phone]  Per [facility], their company needs three months of clinical notes , including conversation between patient and provider.[employer] would like to know if their is still a medical need for patient's wheelchair. If so they are requesting a new prescription.

## 2020-09-19 NOTE — Telephone Encounter (Signed)
See Rx for wheelchair.  OK to send office notes for continuity of care.

## 2020-09-19 NOTE — Telephone Encounter (Signed)
Prescription faxed with ov notes for the last 3 months.

## 2020-10-10 ENCOUNTER — Ambulatory Visit: Payer: PPO | Admitting: Family

## 2020-10-10 ENCOUNTER — Telehealth: Payer: Self-pay | Admitting: *Deleted

## 2020-10-10 NOTE — Telephone Encounter (Signed)
Caller Name [facility] Phone Number [phone] Patient Name Carla Chandler Patient DOB 07-15-1926 Call Type Message Only Information Provided Reason for Call Request to Reschedule Office Appointment Initial Comment Caller states her mother has an appt. she needs to cancel due to transportation. Additional Comment hours provided

## 2020-10-10 NOTE — Telephone Encounter (Signed)
Left message on daughter machine that appt for 12/3 has been cancelled.

## 2020-10-15 DIAGNOSIS — M19011 Primary osteoarthritis, right shoulder: Secondary | ICD-10-CM | POA: Diagnosis not present

## 2020-10-15 DIAGNOSIS — M6281 Muscle weakness (generalized): Secondary | ICD-10-CM | POA: Diagnosis not present

## 2020-10-15 DIAGNOSIS — M19012 Primary osteoarthritis, left shoulder: Secondary | ICD-10-CM | POA: Diagnosis not present

## 2020-10-15 DIAGNOSIS — G9009 Other idiopathic peripheral autonomic neuropathy: Secondary | ICD-10-CM | POA: Diagnosis not present

## 2020-10-20 DIAGNOSIS — F419 Anxiety disorder, unspecified: Secondary | ICD-10-CM | POA: Insufficient documentation

## 2020-10-20 DIAGNOSIS — I10 Essential (primary) hypertension: Secondary | ICD-10-CM | POA: Insufficient documentation

## 2020-10-20 DIAGNOSIS — F32A Depression, unspecified: Secondary | ICD-10-CM | POA: Insufficient documentation

## 2020-10-21 ENCOUNTER — Ambulatory Visit: Payer: PPO | Admitting: Cardiology

## 2020-10-21 ENCOUNTER — Telehealth: Payer: Self-pay

## 2020-10-21 ENCOUNTER — Ambulatory Visit (HOSPITAL_BASED_OUTPATIENT_CLINIC_OR_DEPARTMENT_OTHER)
Admission: RE | Admit: 2020-10-21 | Discharge: 2020-10-21 | Disposition: A | Discharge status: Home / Self Care | Payer: PPO | Source: Ambulatory Visit | Attending: Cardiology | Admitting: Cardiology

## 2020-10-21 ENCOUNTER — Other Ambulatory Visit: Payer: Self-pay

## 2020-10-21 ENCOUNTER — Encounter: Payer: Self-pay | Admitting: Cardiology

## 2020-10-21 VITALS — BP 140/72 | HR 80 | Ht 63.0 in | Wt 168.0 lb

## 2020-10-21 DIAGNOSIS — R0602 Shortness of breath: Secondary | ICD-10-CM

## 2020-10-21 DIAGNOSIS — I480 Paroxysmal atrial fibrillation: Secondary | ICD-10-CM

## 2020-10-21 DIAGNOSIS — I10 Essential (primary) hypertension: Secondary | ICD-10-CM

## 2020-10-21 NOTE — Progress Notes (Signed)
Cardiology Office Note:    Date:  10/21/2020   ID:  Carla Chandler, DOB [birth year], MRN [id number]  PCP:  [name], NP  Cardiologist:  Berniece Salines, DO  Electrophysiologist:  None   Referring MD: [name], NP   " I feel a bit short of breath"  History of Present Illness:    Carla Chandler is a 86 y.o. female with a hx of asthma, paroxysmal atrial fibrillation which was diagnosed over 20 years ago has not had any recurrent episodes and has been back in sinus rhythm, hyperlipidemia hypertension.  Patient is here today for follow-up visit.  She is here today with her daughter [name]. She tells me she has been feeling shortness of breath recently.  She notes that she was at her home and like [place] and there was some molds.  She has a history where she has had breathing issues when she get exposed to dust and mold.  Since that time she has had some coughing and also feels shortness of breath as well. No chest pain  Past Medical History:  Diagnosis Date  . Anxiety   . Breast cancer ([place])    right  . Hyperlipidemia   . Hypertension     Past Surgical History:  Procedure Laterality Date  . BLADDER SUSPENSION    . BREAST SURGERY Right 2008   lumpectomy  . LAPAROSCOPIC HYSTERECTOMY    . LYMPHADENECTOMY Right 2016    Current Medications: Current Meds  Medication Sig  . aspirin 81 MG EC tablet Take by mouth.  Marland Kitchen b complex vitamins capsule Take by mouth.  . citalopram (CELEXA) 20 MG tablet Take 1 tablet (20 mg total) by mouth daily.  . irbesartan-hydrochlorothiazide (AVALIDE) 150-12.5 MG tablet Take 1 tablet by mouth daily.  Marland Kitchen LORazepam (ATIVAN) 1 MG tablet TAKE 1 TABLET BY MOUTH AT BEDTIME  . omeprazole (PRILOSEC) 40 MG capsule Take 1 capsule (40 mg total) by mouth daily.  . verapamil (CALAN-SR) 120 MG CR tablet Take by mouth.  Marland Kitchen ZIOPTAN 0.0015 % SOLN      Allergies:   Bimatoprost, Ibuprofen, Levofloxacin, Penicillins, Statins, Atorvastatin,  Brimonidine, Dorzolamide hcl-timolol mal, Lovastatin, and Perindopril   Social History   Socioeconomic History  . Marital status: Widowed    Spouse name: Not on file  . Number of children: Not on file  . Years of education: Not on file  . Highest education level: Not on file  Occupational History  . Occupation: Retired  Tobacco Use  . Smoking status: Former Research scientist (life sciences)  . Smokeless tobacco: Never Used  Vaping Use  . Vaping Use: Never used  Substance and Sexual Activity  . Alcohol use: Yes    Alcohol/week: 3.0 standard drinks    Types: 3 Glasses of wine per week    Comment: wine 3 x per week  . Drug use: Never  . Sexual activity: Not Currently  Other Topics Concern  . 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 Difficulty of Paying Living Expenses:  Not on file  Food Insecurity:   . Worried About Charity fundraiser in the Last Year: Not on file  . Ran Out of Food in the Last Year: Not on file  Transportation Needs:   . Lack of Transportation (Medical): Not on file  . Lack of Transportation (Non-Medical): Not on file  Physical Activity:   . Days of Exercise per Week: Not on file  . Minutes of Exercise per Session: Not on file  Stress:   . Feeling of Stress : Not on file  Social Connections:   . Frequency of Communication with Friends and Family: Not on file  . Frequency of Social Gatherings with Friends and Family: Not on file  . Attends Religious Services: Not on file  . Active Member of Clubs or Organizations: Not on file  . Attends Archivist Meetings: Not on file  . Marital Status: Not on file      Family History: The patient's family history includes Aneurysm in her brother; Cancer in her sister; Heart attack in her father; Lymphoma in her sister; Stroke in her sister.  ROS:   Review of Systems  Constitution: Negative for decreased appetite, fever and weight gain.  HENT: Negative for congestion, ear discharge, hoarse voice and sore throat.   Eyes: Negative for discharge, redness, vision loss in right eye and visual halos.  Cardiovascular: Negative for chest pain, dyspnea on exertion, leg swelling, orthopnea and palpitations.  Respiratory: Negative for cough, hemoptysis, shortness of breath and snoring.   Endocrine: Negative for heat intolerance and polyphagia.  Hematologic/Lymphatic: Negative for bleeding problem. Does not bruise/bleed easily.  Skin: Negative for flushing, nail changes, rash and suspicious lesions.  Musculoskeletal: Negative for arthritis, joint pain, muscle cramps, myalgias, neck pain and stiffness.  Gastrointestinal: Negative for abdominal pain, bowel incontinence, diarrhea and excessive appetite.  Genitourinary: Negative for decreased libido, genital sores and incomplete emptying.  Neurological: Negative for brief paralysis, focal weakness, headaches and loss of balance.  Psychiatric/Behavioral: Negative for altered mental status, depression and suicidal ideas.  Allergic/Immunologic: Negative for HIV exposure and persistent infections.    EKGs/Labs/Other Studies Reviewed:    The following studies were reviewed today:   EKG: None today  Recent Labs: 06/11/2020: ALT 12; BUN 23; Creatinine, Ser 0.54; Potassium 4.4; Sodium 136  Recent Lipid Panel No results found for: CHOL, TRIG, HDL, CHOLHDL, VLDL, LDLCALC, LDLDIRECT  Physical Exam:    VS:  BP 140/72   Pulse 80   Ht 5\' 3"  (1.6 m)   Wt 168 lb (76.2 kg)   SpO2 95%   BMI 29.76 kg/m     Wt Readings from Last 3 Encounters:  10/21/20 168 lb (76.2 kg)  07/11/20 166 lb (75.3 kg)  07/11/20 164 lb (74.4  kg)     GEN: Well nourished, well developed in no acute distress HEENT: Normal NECK: No JVD; No carotid bruits LYMPHATICS: No lymphadenopathy CARDIAC: S1S2 noted,RRR, no murmurs, rubs, gallops RESPIRATORY:  Clear to auscultation without rales, wheezing or rhonchi  ABDOMEN: Soft, non-tender, non-distended, +bowel sounds, no guarding. EXTREMITIES: No edema, No cyanosis, no clubbing MUSCULOSKELETAL:  No deformity  SKIN: Warm and dry NEUROLOGIC:  Alert and oriented x 3, non-focal PSYCHIATRIC:  Normal affect, good insight  ASSESSMENT:    1. Shortness of breath   2. Hypertension, unspecified type   3. PAF (paroxysmal atrial fibrillation) ([place])   4. Essential hypertension    PLAN:     Her shortness of breath does sound primary respiratory  she does have some wheezing on auscultation.  And some intermittent coughing.  Will like to do a scan chest x-ray on this patient.  She may also need a COVID-19 testing, and blood work will be done today as well.  She plans to see her PCP on Friday I encouraged the patient to continue this visit.  She may need to get antibiotics or inhaler use.  Her echocardiogram which was done previously also discussed.  She notes that she has had intermittent dizziness but symptoms are related with her recent shortness of breath.  We will like to do this once we understand more reports going on with the symptoms based on her respiratory condition.  Once this has improved we will consider placing a monitor and patient understand for any atrial fibrillation burden that may be contributing to her dizziness.  The patient is in agreement with the above plan. The patient left the office in stable condition.  The patient will follow up in 1 month   Medication Adjustments/Labs and Tests Ordered: Current medicines are reviewed at length with the patient today.  Concerns regarding medicines are outlined above.  Orders Placed This Encounter  Procedures  . DG Chest 2 View  .  Basic metabolic panel  . CBC  . Magnesium   No orders of the defined types were placed in this encounter.   Patient Instructions  Medication Instructions:  Your physician recommends that you continue on your current medications as directed. Please refer to the Current Medication list given to you today.  *If you need a refill on your cardiac medications before your next appointment, please call your pharmacy*   Lab Work: TODAY: BMET, Mg, CBC If you have labs (blood work) drawn today and your tests are completely normal, you will receive your results only by: Marland Kitchen MyChart Message (if you have MyChart) OR . A paper copy in the mail If you have any lab test that is abnormal or we need to change your treatment, we will call you to review the results.   Testing/Procedures: A chest x-ray takes a picture of the organs and structures inside the chest, including the heart, lungs, and blood vessels. This test can show several things, including, whether the heart is enlarges; whether fluid is building up in the lungs; and whether pacemaker / defibrillator leads are still in place.  Follow-Up: At [facility], you and your health needs are our priority.  As part of our continuing mission to provide you with exceptional heart care, we have created designated Provider Care Teams.  These Care Teams include your primary Cardiologist (physician) and Advanced Practice Providers (APPs -  Physician Assistants and Nurse Practitioners) who all work together to provide you with the care you need, when you need it.  Your next appointment:   1 month(s)  The format for your next appointment:   In Person  Provider:   Berniece Salines, DO      Adopting a Healthy Lifestyle.  Know what a healthy weight is for you (roughly BMI <25) and aim to maintain this   Aim for 7+ servings of fruits and vegetables daily   65-80+ fluid ounces of water or unsweet tea for healthy kidneys   Limit to max 1 drink of  alcohol per day; avoid smoking/tobacco   Limit animal fats in diet for cholesterol and heart health - choose grass fed whenever available   Avoid highly processed foods, and foods high in saturated/trans fats   Aim for low  stress - take time to unwind and care for your mental health   Aim for 150 min of moderate intensity exercise weekly for heart health, and weights twice weekly for bone health   Aim for 7-9 hours of sleep daily   When it comes to diets, agreement about the perfect plan isnt easy to find, even among the experts. Experts at the [employer] developed an idea known as the Healthy Eating Plate. Just imagine a plate divided into logical, healthy portions.   The emphasis is on diet quality:   Load up on vegetables and fruits - one-half of your plate: Aim for color and variety, and remember that potatoes dont count.   Go for whole grains - one-quarter of your plate: Whole wheat, barley, wheat berries, quinoa, oats, brown rice, and foods made with them. If you want pasta, go with whole wheat pasta.   Protein power - one-quarter of your plate: Fish, chicken, beans, and nuts are all healthy, versatile protein sources. Limit red meat.   The diet, however, does go beyond the plate, offering a few other suggestions.   Use healthy plant oils, such as olive, canola, soy, corn, sunflower and peanut. Check the labels, and avoid partially hydrogenated oil, which have unhealthy trans fats.   If youre thirsty, drink water. Coffee and tea are good in moderation, but skip sugary drinks and limit milk and dairy products to one or two daily servings.   The type of carbohydrate in the diet is more important than the amount. Some sources of carbohydrates, such as vegetables, fruits, whole grains, and beans-are healthier than others.   Finally, stay active  Signed, Berniece Salines, DO  10/21/2020 3:57 PM    [place]

## 2020-10-21 NOTE — Patient Instructions (Signed)
Medication Instructions:  Your physician recommends that you continue on your current medications as directed. Please refer to the Current Medication list given to you today.  *If you need a refill on your cardiac medications before your next appointment, please call your pharmacy*   Lab Work: TODAY: BMET, Mg, CBC If you have labs (blood work) drawn today and your tests are completely normal, you will receive your results only by: Marland Kitchen MyChart Message (if you have MyChart) OR . A paper copy in the mail If you have any lab test that is abnormal or we need to change your treatment, we will call you to review the results.   Testing/Procedures: A chest x-ray takes a picture of the organs and structures inside the chest, including the heart, lungs, and blood vessels. This test can show several things, including, whether the heart is enlarges; whether fluid is building up in the lungs; and whether pacemaker / defibrillator leads are still in place.  Follow-Up: At [facility], you and your health needs are our priority.  As part of our continuing mission to provide you with exceptional heart care, we have created designated Provider Care Teams.  These Care Teams include your primary Cardiologist (physician) and Advanced Practice Providers (APPs -  Physician Assistants and Nurse Practitioners) who all work together to provide you with the care you need, when you need it.  Your next appointment:   1 month(s)  The format for your next appointment:   In Person  Provider:   Berniece Salines, DO

## 2020-10-21 NOTE — Telephone Encounter (Signed)
Left message on patients voicemail to please return our call.   

## 2020-10-21 NOTE — Telephone Encounter (Signed)
-----   Message from Berniece Salines, DO sent at 10/21/2020  4:39 PM EST ----- The chest x-ray is normal does not show any evidence of any lung disease.  Hopefully when you see your PCP on Friday if not clear up he can get some inhalers.

## 2020-10-22 LAB — BASIC METABOLIC PANEL
BUN/Creatinine Ratio: 44 — ABNORMAL HIGH (ref 12–28)
BUN: 29 mg/dL (ref 10–36)
CO2: 28 mmol/L (ref 20–29)
Calcium: 10.4 mg/dL — ABNORMAL HIGH (ref 8.7–10.3)
Chloride: 100 mmol/L (ref 96–106)
Creatinine, Ser: 0.66 mg/dL (ref 0.57–1.00)
GFR calc Af Amer: 89 mL/min/{1.73_m2} (ref >59)
GFR calc non Af Amer: 77 mL/min/{1.73_m2} (ref >59)
Glucose: 109 mg/dL — ABNORMAL HIGH (ref 65–99)
Potassium: 5.3 mmol/L — ABNORMAL HIGH (ref 3.5–5.2)
Sodium: 141 mmol/L (ref 134–144)

## 2020-10-22 LAB — CBC
Hematocrit: 40.8 % (ref 34.0–46.6)
Hemoglobin: 14.2 g/dL (ref 11.1–15.9)
MCH: 34.2 pg — ABNORMAL HIGH (ref 26.6–33.0)
MCHC: 34.8 g/dL (ref 31.5–35.7)
MCV: 98 fL — ABNORMAL HIGH (ref 79–97)
Platelets: 168 10*3/uL (ref 150–450)
RBC: 4.15 x10E6/uL (ref 3.77–5.28)
RDW: 12.6 % (ref 11.7–15.4)
WBC: 6.1 10*3/uL (ref 3.4–10.8)

## 2020-10-22 LAB — MAGNESIUM: Magnesium: 2.6 mg/dL — ABNORMAL HIGH (ref 1.6–2.3)

## 2020-10-24 ENCOUNTER — Ambulatory Visit: Payer: PPO | Admitting: Family

## 2020-11-10 DIAGNOSIS — C50919 Malignant neoplasm of unspecified site of unspecified female breast: Secondary | ICD-10-CM | POA: Insufficient documentation

## 2020-11-10 DIAGNOSIS — E785 Hyperlipidemia, unspecified: Secondary | ICD-10-CM | POA: Insufficient documentation

## 2020-11-12 ENCOUNTER — Other Ambulatory Visit: Payer: Self-pay

## 2020-11-12 ENCOUNTER — Ambulatory Visit: Payer: PPO | Admitting: Cardiology

## 2020-11-12 ENCOUNTER — Ambulatory Visit (INDEPENDENT_AMBULATORY_CARE_PROVIDER_SITE_OTHER): Payer: PPO

## 2020-11-12 VITALS — BP 104/58 | HR 100 | Ht 64.0 in | Wt 167.0 lb

## 2020-11-12 DIAGNOSIS — I480 Paroxysmal atrial fibrillation: Secondary | ICD-10-CM | POA: Diagnosis not present

## 2020-11-12 DIAGNOSIS — R42 Dizziness and giddiness: Secondary | ICD-10-CM

## 2020-11-12 DIAGNOSIS — I10 Essential (primary) hypertension: Secondary | ICD-10-CM

## 2020-11-12 HISTORY — DX: Essential (primary) hypertension: I10

## 2020-11-12 HISTORY — DX: Dizziness and giddiness: R42

## 2020-11-12 MED ORDER — IRBESARTAN 150 MG PO TABS: 150.0000 mg | ORAL_TABLET | Freq: Every day | ORAL | 3 refills | Status: DC

## 2020-11-12 NOTE — Patient Instructions (Addendum)
Medication Instructions:  Your physician has recommended you make the following change in your medication:   Start Verapamil 120 mg daily. Stop Hydrochlorothiazide combo  Start Irbesatan 150 mg daily   *If you need a refill on your cardiac medications before your next appointment, please call your pharmacy*   Lab Work: Your physician recommends that you have labs done in the office today. Your test included  basic metabolic panel and magnesium.  If you have labs (blood work) drawn today and your tests are completely normal, you will receive your results only by: Marland Kitchen MyChart Message (if you have MyChart) OR . A paper copy in the mail If you have any lab test that is abnormal or we need to change your treatment, we will call you to review the results.   Testing/Procedures:  WHY IS MY DOCTOR PRESCRIBING ZIO? The Zio system is proven and trusted by physicians to detect and diagnose irregular heart rhythms -- and has been prescribed to hundreds of thousands of patients.  The FDA has cleared the Zio system to monitor for many different kinds of irregular heart rhythms. In a study, physicians were able to reach a diagnosis 90% of the time with the Zio system1.  You can wear the Zio monitor -- a small, discreet, comfortable patch -- during your normal day-to-day activity, including while you sleep, shower, and exercise, while it records every single heartbeat for analysis.  1Barrett, P., et al. Comparison of 24 Hour Holter Monitoring Versus 14 Day Novel Adhesive Patch Electrocardiographic Monitoring. [place], 2014.  ZIO VS. HOLTER MONITORING The Zio monitor can be comfortably worn for up to 14 days. Holter monitors can be worn for 24 to 48 hours, limiting the time to record any irregular heart rhythms you may have. Zio is able to capture data for the 51% of patients who have their first symptom-triggered arrhythmia after 48 hours.1  LIVE WITHOUT RESTRICTIONS The Zio  ambulatory cardiac monitor is a small, unobtrusive, and water-resistant patch--you might even forget you're wearing it. The Zio monitor records and stores every beat of your heart, whether you're sleeping, working out, or showering. Wear the monitor for 14 days, remove 11/26/20.   Follow-Up: At [facility], you and your health needs are our priority.  As part of our continuing mission to provide you with exceptional heart care, we have created designated Provider Care Teams.  These Care Teams include your primary Cardiologist (physician) and Advanced Practice Providers (APPs -  Physician Assistants and Nurse Practitioners) who all work together to provide you with the care you need, when you need it.  We recommend signing up for the patient portal called "MyChart".  Sign up information is provided on this After Visit Summary.  MyChart is used to connect with patients for Virtual Visits (Telemedicine).  Patients are able to view lab/test results, encounter notes, upcoming appointments, etc.  Non-urgent messages can be sent to your provider as well.   To learn more about what you can do with MyChart, go to [email or web address].    Your next appointment:   8 week(s)  The format for your next appointment:   In Person  Provider:   Berniece Salines, DO   Other Instructions NA

## 2020-11-12 NOTE — Progress Notes (Signed)
Cardiology Office Note:    Date:  11/12/2020   ID:  Carla Chandler, DOB November 29, 1925, MRN [id number]  PCP:  Carla Alar, NP  Cardiologist:  Carla Salines, DO  Electrophysiologist:  None   Referring MD: Carla Alar, NP   " I am still feeling dizzy"  History of Present Illness:    Carla Chandler is a 86 y.o. female with a a hx of asthma, paroxysmal atrial fibrillation which was diagnosed over 20 years ago has not had any recurrent episodes and has been back in sinus rhythm, hyperlipidemia, hypertension. Patient is here today for follow-up visit.  She is here today with her daughter.  I last saw the patient on 10/21/2020 and at that time she was experiencing some shortness of breath and dizziness. Based on her clinical exam, I suspected that her shortness breath may have been pulmonary in nature. We got a chest x-ray and blood work. The chest x-ray was normal. Her blood work showed hyperkalemia.   Today she tell me that she is still experiencing dizziness and is concern that even with her walker she does have some dizziness at that time as well.   Past Medical History:  Diagnosis Date  . Accelerated hypertension 12/04/2015  . AF (paroxysmal atrial fibrillation) ([place]) 06/12/2020  . Anxiety   . Bilateral impacted cerumen 02/12/2018  . Breast cancer (Carla Chandler)    right  . 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 Cyst of joint of shoulder 12/20/2017  . DLBCL (diffuse large B cell lymphoma) ([place]) 02/29/2016  . Drusen of macula of both eyes 02/29/2016  . Dry eyes, bilateral 02/29/2016  . Dyslipidemia, goal LDL below 130 10/26/2016  . Dysphagia 05/25/2015  . Dyspnea 05/25/2015  . Essential hypertension 05/27/2015  . Excessive cerumen in ear canal, bilateral 04/26/2017  . Gastroesophageal reflux disease 06/12/2020  . Glaucoma 06/12/2020  . History of B-cell lymphoma 10/26/2016  . History of breast cancer 06/12/2020  . 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 2013 rt radiation with balloon  . Hyperlipidemia   . Hypertension   . Hyponatremia 05/25/2015   Formatting of this note might be different from the original. Mild and likely 2ry to diuretic use  . Idiopathic peripheral neuropathy 10/26/2016  . Insomnia 06/12/2020  . Knee effusion, right 01/22/2016  . Laryngeal edema 05/25/2015  . Lymphoma ([place]) 05/05/2015  . Microcalcification of left breast on mammogram 09/27/2017  . Murmur, cardiac 07/11/2020  . Osteopenia of left thigh 10/26/2016  . Other visual disturbances 12/04/2015  . PAF (paroxysmal atrial fibrillation) (Carla Chandler) 07/11/2020  . Poor balance 06/12/2020  . Primary open angle glaucoma of left eye, moderate stage 02/29/2016  . Primary osteoarthritis of both knees 01/22/2016  . Primary osteoarthritis of both shoulders 04/12/2017  . Primary osteoarthritis of left knee 01/11/2017  . Pseudophakia of both eyes 02/29/2016  . PVD (posterior vitreous detachment), both eyes 02/29/2016  . Radiation injury 05/27/2015  . Right rotator cuff tear arthropathy 01/03/2018  . Sensorineural hearing loss (SNHL) of both ears 04/26/2017  . Varicose veins of left lower extremity 06/11/2020    Past Surgical History:  Procedure Laterality Date  . BLADDER SUSPENSION    . BREAST SURGERY Right 2008   lumpectomy  . LAPAROSCOPIC HYSTERECTOMY    . LYMPHADENECTOMY Right 2016    Current Medications: Current Meds  Medication Sig  . aspirin 81 MG EC tablet Take by  mouth.  . b complex vitamins capsule Take by mouth.  . citalopram (CELEXA) 20 MG tablet Take 1 tablet (20 mg total) by mouth daily.  Marland Kitchen LORazepam (ATIVAN) 1 MG tablet TAKE 1 TABLET BY MOUTH AT BEDTIME  . omeprazole (PRILOSEC) 40 MG capsule Take 1 capsule (40 mg total) by mouth daily.  . verapamil (CALAN-SR) 120 MG CR tablet Take by mouth.  Marland Kitchen ZIOPTAN 0.0015 % SOLN   . [DISCONTINUED] irbesartan-hydrochlorothiazide (AVALIDE) 150-12.5 MG tablet Take 1 tablet by  mouth daily.     Allergies:   Bimatoprost, Ibuprofen, Levofloxacin, Penicillins, Statins, Atorvastatin, Brimonidine, Dorzolamide hcl-timolol mal, Lovastatin, and Perindopril   Social History   Socioeconomic History  . Marital status: Widowed    Spouse name: Not on file  . Number of children: Not on file  . Years of education: Not on file  . Highest education level: Not on file  Occupational History  . Occupation: Retired  Tobacco Use  . Smoking status: Former Research scientist (life sciences)  . Smokeless tobacco: Never Used  Vaping Use  . Vaping Use: Never used  Substance and Sexual Activity  . Alcohol use: Yes    Alcohol/week: 3.0 standard drinks    Types: 3 Glasses of wine per week    Comment: wine 3 x per week  . Drug use: Never  . Sexual activity: Not Currently  Other Topics Concern  . 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  ROS:   Review of Systems  Constitution: Negative for decreased appetite, fever and weight gain.  HENT: Negative for congestion, ear discharge, hoarse voice and sore throat.   Eyes: Negative for discharge, redness, vision loss in right eye and visual halos.  Cardiovascular: Negative for chest pain, dyspnea on exertion, leg swelling, orthopnea and palpitations.   Respiratory: Negative for cough, hemoptysis, shortness of breath and snoring.   Endocrine: Negative for heat intolerance and polyphagia.  Hematologic/Lymphatic: Negative for bleeding problem. Does not bruise/bleed easily.  Skin: Negative for flushing, nail changes, rash and suspicious lesions.  Musculoskeletal: Negative for arthritis, joint pain, muscle cramps, myalgias, neck pain and stiffness.  Gastrointestinal: Negative for abdominal pain, bowel incontinence, diarrhea and excessive appetite.  Genitourinary: Negative for decreased libido, genital sores and incomplete emptying.  Neurological: Negative for brief paralysis, focal weakness, headaches and loss of balance.  Psychiatric/Behavioral: Negative for altered mental status, depression and suicidal ideas.  Allergic/Immunologic: Negative for HIV exposure and persistent infections.    EKGs/Labs/Other Studies Reviewed:    The following studies were reviewed today:   EKG: None today  Recent Labs: 06/11/2020: ALT 12 10/21/2020: BUN 29; Creatinine, Ser 0.66; Hemoglobin 14.2; Magnesium 2.6; Platelets 168; Potassium  5.3; Sodium 141  Recent Lipid Panel No results found for: CHOL, TRIG, HDL, CHOLHDL, VLDL, LDLCALC, LDLDIRECT  Physical Exam:    VS:  BP (!) 104/58   Pulse 100   Ht 5\' 4"  (1.626 m)   Wt 167 lb (75.8 kg)   SpO2 94%   BMI 28.67 kg/m     Wt Readings from Last 3 Encounters:  11/12/20 167 lb (75.8 kg)  10/21/20 168 lb (76.2 kg)  07/11/20 166 lb (75.3 kg)     GEN: Well nourished, well developed in no acute distress HEENT: Normal NECK: No JVD; No carotid bruits LYMPHATICS: No lymphadenopathy CARDIAC: S1S2 noted,RRR, no murmurs, rubs, gallops RESPIRATORY:  Clear to auscultation without rales, wheezing or rhonchi  ABDOMEN: Soft, non-tender, non-distended, +bowel sounds, no guarding. EXTREMITIES: No edema, No cyanosis, no clubbing MUSCULOSKELETAL:  No deformity  SKIN: Warm and dry NEUROLOGIC:  Alert and oriented x 3,  non-focal PSYCHIATRIC:  Normal affect, good insight  ASSESSMENT:    1. PAF (paroxysmal atrial fibrillation) ([place])   2. Dizziness   3. Primary hypertension    PLAN:     Her dizziness is concerning especially given the factor that the patient has paroxysmal atrial fibrillation making sure that sick sinus syndrome is not playing a role therefore place a monitor the patient today. Her blood pressure also has been on lower side I am going to stop hydrochlorothiazide continue patient on her irbesartan as well as her verapamil.  The patient is in agreement with the above plan. The patient left the office in stable condition.  The patient will follow up in 8 weeks or sooner if needed.   Medication Adjustments/Labs and Tests Ordered: Current medicines are reviewed at length with the patient today.  Concerns regarding medicines are outlined above.  Orders Placed This Encounter  Procedures  . Basic metabolic panel  . Magnesium  . LONG TERM MONITOR (3-14 DAYS)   Meds ordered this encounter  Medications  . irbesartan (AVAPRO) 150 MG tablet    Sig: Take 1 tablet (150 mg total) by mouth daily.    Dispense:  90 tablet    Refill:  3    Patient Instructions  Medication Instructions:  Your physician has recommended you make the following change in your medication:   Start Verapamil 120 mg daily. Stop Hydrochlorothiazide combo  Start Irbesatan 150 mg daily   *If you need a refill on your cardiac medications before your next appointment, please call your pharmacy*   Lab Work: Your physician recommends that you have labs done in the office today. Your test included  basic metabolic panel and magnesium.  If you have labs (blood work) drawn today and your tests are completely normal, you will receive your results only by: Marland Kitchen MyChart Message (if you have MyChart) OR . A paper copy in the mail If you have any lab test that is abnormal or we need to change your treatment, we will call you to  review the results.   Testing/Procedures:  WHY IS MY DOCTOR PRESCRIBING ZIO? The Zio system is proven and trusted by physicians to detect and diagnose irregular heart rhythms -- and has been prescribed to hundreds of thousands of patients.  The FDA has cleared the Zio system to monitor for many different kinds of irregular heart rhythms. In a study, physicians were able to reach a diagnosis 90% of the time with the Zio system1.  You can wear the Zio monitor -- a small, discreet, comfortable patch -- during  your normal day-to-day activity, including while you sleep, shower, and exercise, while it records every single heartbeat for analysis.  1Barrett, P., et al. Comparison of 24 Hour Holter Monitoring Versus 14 Day Novel Adhesive Patch Electrocardiographic Monitoring. [employer], 2014.  ZIO VS. HOLTER MONITORING The Zio monitor can be comfortably worn for up to 14 days. Holter monitors can be worn for 24 to 48 hours, limiting the time to record any irregular heart rhythms you may have. Zio is able to capture data for the 51% of patients who have their first symptom-triggered arrhythmia after 48 hours.1  LIVE WITHOUT RESTRICTIONS The Zio ambulatory cardiac monitor is a small, unobtrusive, and water-resistant patch--you might even forget you're wearing it. The Zio monitor records and stores every beat of your heart, whether you're sleeping, working out, or showering. Wear the monitor for 14 days, remove 11/26/20.   Follow-Up: At [facility], you and your health needs are our priority.  As part of our continuing mission to provide you with exceptional heart care, we have created designated Provider Care Teams.  These Care Teams include your primary Cardiologist (physician) and Advanced Practice Providers (APPs -  Physician Assistants and Nurse Practitioners) who all work together to provide you with the care you need, when you need it.  We recommend signing up for the patient  portal called "MyChart".  Sign up information is provided on this After Visit Summary.  MyChart is used to connect with patients for Virtual Visits (Telemedicine).  Patients are able to view lab/test results, encounter notes, upcoming appointments, etc.  Non-urgent messages can be sent to your provider as well.   To learn more about what you can do with MyChart, go to [email or web address].    Your next appointment:   8 week(s)  The format for your next appointment:   In Person  Provider:   Berniece Salines, DO   Other Instructions NA     Adopting a Healthy Lifestyle.  Know what a healthy weight is for you (roughly BMI <25) and aim to maintain this   Aim for 7+ servings of fruits and vegetables daily   65-80+ fluid ounces of water or unsweet tea for healthy kidneys   Limit to max 1 drink of alcohol per day; avoid smoking/tobacco   Limit animal fats in diet for cholesterol and heart health - choose grass fed whenever available   Avoid highly processed foods, and foods high in saturated/trans fats   Aim for low stress - take time to unwind and care for your mental health   Aim for 150 min of moderate intensity exercise weekly for heart health, and weights twice weekly for bone health   Aim for 7-9 hours of sleep daily   When it comes to diets, agreement about the perfect plan isnt easy to find, even among the experts. Experts at the [employer] developed an idea known as the Healthy Eating Plate. Just imagine a plate divided into logical, healthy portions.   The emphasis is on diet quality:   Load up on vegetables and fruits - one-half of your plate: Aim for color and variety, and remember that potatoes dont count.   Go for whole grains - one-quarter of your plate: Whole wheat, barley, wheat berries, quinoa, oats, brown rice, and foods made with them. If you want pasta, go with whole wheat pasta.   Protein power - one-quarter of your plate: Fish,  chicken, beans, and nuts are all healthy, versatile protein  sources. Limit red meat.   The diet, however, does go beyond the plate, offering a few other suggestions.   Use healthy plant oils, such as olive, canola, soy, corn, sunflower and peanut. Check the labels, and avoid partially hydrogenated oil, which have unhealthy trans fats.   If youre thirsty, drink water. Coffee and tea are good in moderation, but skip sugary drinks and limit milk and dairy products to one or two daily servings.   The type of carbohydrate in the diet is more important than the amount. Some sources of carbohydrates, such as vegetables, fruits, whole grains, and beans-are healthier than others.   Finally, stay active  Signed, Carla Salines, DO  11/12/2020 12:27 PM    [facility]

## 2020-11-13 ENCOUNTER — Telehealth: Payer: Self-pay | Admitting: Cardiology

## 2020-11-13 ENCOUNTER — Telehealth: Payer: Self-pay

## 2020-11-13 LAB — BASIC METABOLIC PANEL
BUN/Creatinine Ratio: 35 — ABNORMAL HIGH (ref 12–28)
BUN: 26 mg/dL (ref 10–36)
CO2: 24 mmol/L (ref 20–29)
Calcium: 9.5 mg/dL (ref 8.7–10.3)
Chloride: 97 mmol/L (ref 96–106)
Creatinine, Ser: 0.74 mg/dL (ref 0.57–1.00)
GFR calc Af Amer: 81 mL/min/{1.73_m2} (ref >59)
GFR calc non Af Amer: 71 mL/min/{1.73_m2} (ref >59)
Glucose: 101 mg/dL — ABNORMAL HIGH (ref 65–99)
Potassium: 4.7 mmol/L (ref 3.5–5.2)
Sodium: 136 mmol/L (ref 134–144)

## 2020-11-13 LAB — MAGNESIUM: Magnesium: 2.3 mg/dL (ref 1.6–2.3)

## 2020-11-13 MED ORDER — VERAPAMIL HCL ER 120 MG PO TBCR
120.0000 mg | EXTENDED_RELEASE_TABLET | Freq: Every day | ORAL | 1 refills | Status: DC
Start: 2020-11-13 — End: 2021-01-10

## 2020-11-13 NOTE — Telephone Encounter (Signed)
*  STAT* If patient is at the pharmacy, call can be transferred to refill team.   1. Which medications need to be refilled? (please list name of each medication and dose if known) verapamil (CALAN-SR) 120 MG CR tablet  2. Which pharmacy/location (including street and city if local pharmacy) is medication to be sent to? [place]  3. Do they need a 30 day or 90 day supply? [place]

## 2020-11-13 NOTE — Telephone Encounter (Signed)
Patient notified of results and verbalized understanding.  

## 2020-11-13 NOTE — Telephone Encounter (Signed)
Medication filled.  

## 2020-11-13 NOTE — Telephone Encounter (Signed)
-----   Message from Berniece Salines, DO sent at 11/13/2020 10:26 AM EST ----- Labs stable

## 2020-12-04 DIAGNOSIS — R42 Dizziness and giddiness: Secondary | ICD-10-CM | POA: Diagnosis not present

## 2020-12-04 DIAGNOSIS — I480 Paroxysmal atrial fibrillation: Secondary | ICD-10-CM | POA: Diagnosis not present

## 2020-12-17 ENCOUNTER — Other Ambulatory Visit: Payer: Self-pay | Admitting: Family

## 2020-12-18 NOTE — Telephone Encounter (Signed)
Requesting: Ativan Contract: none UDS: none Last Visit: 07/11/2020 Next Visit: none pending Last Refill: 09/17/2020

## 2020-12-22 DIAGNOSIS — I480 Paroxysmal atrial fibrillation: Secondary | ICD-10-CM | POA: Diagnosis not present

## 2020-12-22 DIAGNOSIS — Z7982 Long term (current) use of aspirin: Secondary | ICD-10-CM | POA: Diagnosis not present

## 2020-12-22 DIAGNOSIS — I739 Peripheral vascular disease, unspecified: Secondary | ICD-10-CM | POA: Diagnosis not present

## 2020-12-22 DIAGNOSIS — C8330 Diffuse large B-cell lymphoma, unspecified site: Secondary | ICD-10-CM | POA: Diagnosis not present

## 2020-12-22 DIAGNOSIS — D6869 Other thrombophilia: Secondary | ICD-10-CM | POA: Diagnosis not present

## 2020-12-22 DIAGNOSIS — Z923 Personal history of irradiation: Secondary | ICD-10-CM | POA: Diagnosis not present

## 2020-12-22 DIAGNOSIS — D692 Other nonthrombocytopenic purpura: Secondary | ICD-10-CM | POA: Diagnosis not present

## 2020-12-22 DIAGNOSIS — I10 Essential (primary) hypertension: Secondary | ICD-10-CM | POA: Diagnosis not present

## 2020-12-22 DIAGNOSIS — Z9889 Other specified postprocedural states: Secondary | ICD-10-CM | POA: Diagnosis not present

## 2020-12-22 DIAGNOSIS — F3341 Major depressive disorder, recurrent, in partial remission: Secondary | ICD-10-CM | POA: Diagnosis not present

## 2020-12-22 DIAGNOSIS — Z853 Personal history of malignant neoplasm of breast: Secondary | ICD-10-CM | POA: Diagnosis not present

## 2021-01-09 ENCOUNTER — Encounter: Payer: Self-pay | Admitting: Cardiology

## 2021-01-09 ENCOUNTER — Other Ambulatory Visit: Payer: Self-pay

## 2021-01-09 ENCOUNTER — Ambulatory Visit: Payer: PPO | Admitting: Cardiology

## 2021-01-09 VITALS — BP 92/60 | HR 88 | Ht 64.0 in | Wt 167.0 lb

## 2021-01-09 DIAGNOSIS — I493 Ventricular premature depolarization: Secondary | ICD-10-CM | POA: Diagnosis not present

## 2021-01-09 DIAGNOSIS — I10 Essential (primary) hypertension: Secondary | ICD-10-CM

## 2021-01-09 DIAGNOSIS — I471 Supraventricular tachycardia: Secondary | ICD-10-CM

## 2021-01-09 DIAGNOSIS — R42 Dizziness and giddiness: Secondary | ICD-10-CM | POA: Diagnosis not present

## 2021-01-09 DIAGNOSIS — I491 Atrial premature depolarization: Secondary | ICD-10-CM

## 2021-01-09 DIAGNOSIS — I4719 Other supraventricular tachycardia: Secondary | ICD-10-CM

## 2021-01-09 HISTORY — DX: Atrial premature depolarization: I49.1

## 2021-01-09 HISTORY — DX: Supraventricular tachycardia: I47.1

## 2021-01-09 HISTORY — DX: Other supraventricular tachycardia: I47.19

## 2021-01-09 MED ORDER — PROPRANOLOL HCL 10 MG PO TABS: 10.0000 mg | ORAL_TABLET | Freq: Two times a day (BID) | ORAL | 3 refills | Status: DC

## 2021-01-09 NOTE — Progress Notes (Signed)
Cardiology Office Note:    Date:  01/09/2021   ID:  Carla Chandler, DOB 1926/10/25, MRN [id number]  PCP:  [name], NP  Cardiologist:  Berniece Salines, DO  Electrophysiologist:  None   Referring MD: [name], NP   Chief Complaint  Patient presents with  . Follow-up    History of Present Illness:    Carla Chandler is a 87 y.o. female with a hx of asthma, paroxysmal atrial fibrillation which was diagnosed over 20 years ago has not had any recurrent episodes and has been back in sinus rhythm, hyperlipidemia, hypertension. Patient is here today for follow-up visit. She is here today with her daughter.  I last saw the patient on 10/21/2020 and at that time she was experiencing some shortness of breath and dizziness. Based on her clinical exam, I suspected that her shortness breath may have been pulmonary in nature. We got a chest x-ray and blood work. The chest x-ray was normal. Her blood work showed hyperkalemia.   Her last visit was November 12, 2020 at that time she had reported that she was experiencing significant dizziness I stop her hydrochlorothiazide kept the patient on her irbesartan and verapamil.  In the interim she was able to wear her monitor she is here to discuss this.\  She reports today that she still is experiencing dizziness even after stopping her hydrochlorothiazide.  She also tells me today that her son has been recently diagnosed with brain cancer and has been told he does not have long to live.   Past Medical History:  Diagnosis Date  . Accelerated hypertension 12/04/2015  . AF (paroxysmal atrial fibrillation) ([place]) 06/12/2020  . Anxiety   . Bilateral impacted cerumen 02/12/2018  . Breast cancer ([place])    right  . 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 Cyst of joint of shoulder 12/20/2017  . DLBCL (diffuse large B cell lymphoma) ([place]) 02/29/2016  . Dr[place]en of macula of both eyes 02/29/2016  . Dry eyes, bilateral 02/29/2016  . Dyslipidemia, goal  LDL below 130 10/26/2016  . Dysphagia 05/25/2015  . Dyspnea 05/25/2015  . Essential hypertension 05/27/2015  . Excessive cerumen in ear canal, bilateral 04/26/2017  . Gastroesophageal reflux disease 06/12/2020  . Glaucoma 06/12/2020  . History of B-cell lymphoma 10/26/2016  . History of breast cancer 06/12/2020  . 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 2013 rt radiation with balloon  . Hyperlipidemia   . Hypertension   . Hyponatremia 05/25/2015   Formatting of this note might be different from the original. Mild and likely 2ry to diuretic use  . Idiopathic peripheral neuropathy 10/26/2016  . Insomnia 06/12/2020  . Knee effusion, right 01/22/2016  . Laryngeal edema 05/25/2015  . Lymphoma ([place]) 05/05/2015  . Microcalcification of left breast on mammogram 09/27/2017  . Murmur, cardiac 07/11/2020  . Osteopenia of left thigh 10/26/2016  . Other visual disturbances 12/04/2015  . PAF (paroxysmal atrial fibrillation) ([place]) 07/11/2020  . Poor balance 06/12/2020  . Primary open angle glaucoma of left eye, moderate stage 02/29/2016  . Primary osteoarthritis of both knees 01/22/2016  . Primary osteoarthritis of both shoulders 04/12/2017  . Primary osteoarthritis of left knee 01/11/2017  . Pseudophakia of both eyes 02/29/2016  . PVD (posterior vitreous detachment), both eyes 02/29/2016  . Radiation injury 05/27/2015  . Right rotator cuff tear arthropathy 01/03/2018  . Sensorineural hearing loss (SNHL) of both ears  04/26/2017  . Varicose veins of left lower extremity 06/11/2020    Past Surgical History:  Procedure Laterality Date  . BLADDER SUSPENSION    . BREAST SURGERY Right 2008   lumpectomy  . LAPAROSCOPIC HYSTERECTOMY    . LYMPHADENECTOMY Right 2016    Current Medications: Current Meds  Medication Sig  . aspirin 81 MG EC tablet Take 81 mg by mouth daily.  Marland Kitchen b complex vitamins capsule Take 1 capsule by mouth  daily.  . citalopram (CELEXA) 20 MG tablet Take 1 tablet (20 mg total) by mouth daily.  Marland Kitchen erythromycin ophthalmic ointment Place 1 application into both eyes at bedtime.  . irbesartan (AVAPRO) 150 MG tablet Take 1 tablet (150 mg total) by mouth daily.  Marland Kitchen LORazepam (ATIVAN) 1 MG tablet TAKE 1 TABLET BY MOUTH AT BEDTIME  . propranolol (INDERAL) 10 MG tablet Take 1 tablet (10 mg total) by mouth 2 (two) times daily.  . verapamil (CALAN-SR) 120 MG CR tablet Take 1 tablet (120 mg total) by mouth daily.  Marland Kitchen ZIOPTAN 0.0015 % SOLN      Allergies:   Bimatoprost, Ibuprofen, Levofloxacin, Penicillins, Statins, Atorvastatin, Brimonidine, Dorzolamide hcl-timolol mal, Lovastatin, and Perindopril   Social History   Socioeconomic History  . Marital status: Widowed    Spouse name: Not on file  . Number of children: Not on file  . Years of education: Not on file  . Highest education level: Not on file  Occupational History  . Occupation: Retired  Tobacco Use  . Smoking status: Former Research scientist (life sciences)  . Smokeless tobacco: Never Used  Vaping Use  . Vaping Use: Never used  Substance and Sexual Activity  . Alcohol use: Yes    Alcohol/week: 3.0 standard drinks    Types: 3 Glasses of wine per week    Comment: wine 3 x per week  . Drug use: Never  . Sexual activity: Not Currently  Other Topics Concern  . 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  ROS:   Review of Systems  Constitution: Negative for decreased appetite, fever and weight gain.  HENT: Negative for congestion, ear discharge, hoarse voice and sore throat.   Eyes: Negative for discharge, redness, vision loss in right eye and visual halos.  Cardiovascular: Negative for chest pain, dyspnea on exertion, leg swelling, orthopnea and palpitations.  Respiratory: Negative for cough, hemoptysis, shortness of breath and snoring.   Endocrine: Negative for heat intolerance and polyphagia.  Hematologic/Lymphatic: Negative for bleeding problem. Does not bruise/bleed easily.  Skin: Negative for flushing, nail changes, rash and suspicious lesions.  Musculoskeletal: Negative for arthritis, joint pain, muscle cramps, myalgias, neck pain and stiffness.  Gastrointestinal: Negative for abdominal pain, bowel incontinence, diarrhea and excessive appetite.  Genitourinary: Negative for decreased libido, genital sores and incomplete emptying.  Neurological: Negative for brief paralysis, focal weakness, headaches and loss of balance.  Psychiatric/Behavioral: Negative for altered mental status, depression and suicidal ideas.  Allergic/Immunologic: Negative for HIV exposure and persistent infections.    EKGs/Labs/Other Studies Reviewed:    The following studies were reviewed today:   EKG:  The ekg ordered today demonstrates    ZIO monitor The patient wore the monitor for 14 days starting  11/12/2020. Indication: Paroxysmal atrial fibrillation   The minimum heart rate was 55 bpm, maximum heart rate was 182 bpm, and average heart rate was 79  bpm. Predominant underlying rhythm was Sinus Rhythm.  50539 Supraventricular Tachycardia runs occurred, the run with the fastest interval lasting 7 beats with a maximum rate of 182 bpm, the longest lasting 48.6 secs  with an avg rate of 108 bpm.  Premature atrial complexes were frequent (8.4%, [id number]). Premature Ventricular complexes were rare (<1%).   No ventricular tachycardia, No pauses, No AV block and no atrial fibrillation present. 9 patient triggered events and 7 diary events all associated with premature atrial complexes.   Conclusion: This study is remarkable for the following:                            1. Paroxysmal supraventricular tachycardia which is likely atrial tachycardia with variable block.                            2. Frequent Premature atrial complexes.    Recent Labs: 06/11/2020: ALT 12 10/21/2020: Hemoglobin 14.2; Platelets 168 11/12/2020: BUN 26; Creatinine, Ser 0.74; Magnesium 2.3; Potassium 4.7; Sodium 136  Recent Lipid Panel No results found for: CHOL, TRIG, HDL, CHOLHDL, VLDL, LDLCALC, LDLDIRECT  Physical Exam:    VS:  BP 92/60 (BP Location: Left Arm, Patient Position: Sitting, Cuff Size: Normal)   Pulse 88   Ht 5\' 4"  (1.626 m)   Wt 167 lb (75.8 kg)   SpO2 93%   BMI 28.67 kg/m     Wt Readings from Last 3 Encounters:  01/09/21 167 lb (75.8 kg)  11/12/20 167 lb (75.8 kg)  10/21/20 168 lb (76.2 kg)     GEN: Well nourished, well developed in no acute distress HEENT: Normal NECK: No JVD; No carotid bruits LYMPHATICS: No lymphadenopathy CARDIAC: S1S2 noted,RRR, no murmurs, rubs, gallops RESPIRATORY:  Clear to auscultation without rales, wheezing or rhonchi  ABDOMEN: Soft, non-tender, non-distended, +bowel sounds, no guarding. EXTREMITIES: No edema, No cyanosis, no clubbing MUSCULOSKELETAL:  No deformity  SKIN: Warm and dry NEUROLOGIC:  Alert and oriented x 3, non-focal PSYCHIATRIC:  Normal affect, good insight  ASSESSMENT:    1. Frequent PAC (premature atrial contraction)   2. PAT (paroxysmal atrial tachycardia) ([place])   3. Essential hypertension   4. Dizziness    PLAN:     We discussed her monitor results which show frequent episodes of  paroxysmal atrial tachycardia as well as frequent episodes of premature atrial complex.  She has had a lot of anxiety recently with her sons diagnosis.  We talked about her monitor results.  We discussed options.  We elected to given the fact that she also is hypotensive with her systolic blood pressure of 92 mmHg and dizzy in the office and stop her irbesartan as well as the verapamil.  We will start her on low-dose propanolol 10 mg twice daily.  Her daughter who is with her today will be checking her blood pressure and in 2 weeks will send me information about this.  She was diagnosed with paroxysmal atrial fibrillation in the past we still have not seen any evidence of A. fib at this time.  For completeness we will going get blood work for BMP, CBC mag as well as TSH.  The patient is in agreement with the above plan. The patient left the office in stable condition.  The patient will follow up in 4 weeks due to medication change.   Medication Adjustments/Labs and Tests Ordered: Current medicines are reviewed at length with the patient today.  Concerns regarding medicines are outlined above.  Orders Placed This Encounter  Procedures  . Basic metabolic panel  . CBC with Differential/Platelet  . TSH  . Magnesium   Meds ordered this encounter  Medications  . propranolol (INDERAL) 10 MG tablet    Sig: Take 1 tablet (10 mg total) by mouth 2 (two) times daily.    Dispense:  180 tablet    Refill:  3    Patient Instructions  Medication Instructions:  Your physician has recommended you make the following change in your medication:  STOP: Irbesartan (AVAPRO) STOP: Verapamil (CALAN-SR)  START: Propranolol 10 mg twice daily *If you need a refill on your cardiac medications before your next appointment, please call your pharmacy*   Lab Work: Your physician recommends that you return for lab work: TODAY BMET, Mag, CBC, TSH  If you have labs (blood work) drawn today and your tests are  completely normal, you will receive your results only by: Marland Kitchen MyChart Message (if you have MyChart) OR . A paper copy in the mail If you have any lab test that is abnormal or we need to change your treatment, we will call you to review the results.   Testing/Procedures: None   Follow-Up: At [facility], you and your health needs are our priority.  As part of our continuing mission to provide you with exceptional heart care, we have created designated Provider Care Teams.  These Care Teams include your primary Cardiologist (physician) and Advanced Practice Providers (APPs -  Physician Assistants and Nurse Practitioners) who all work together to provide you with the care you need, when you need it.  We recommend signing up for the patient portal called "MyChart".  Sign up information is provided on this After Visit Summary.  MyChart is used to connect with patients for Virtual Visits (Telemedicine).  Patients are able to view lab/test results, encounter notes, upcoming appointments, etc.  Non-urgent messages can be sent to your provider as well.   To learn more about what you can do with MyChart, go to [email or web address].    Your next appointment:   4 week(s)  The format for your next appointment:   In Person  Provider:   Berniece Salines, DO   Other Instructions      Adopting a Healthy Lifestyle.  Know what a healthy weight is for you (roughly BMI <25) and aim to maintain this   Aim for 7+ servings of fruits and vegetables daily   65-80+ fluid ounces of water or unsweet tea for healthy kidneys   Limit to max 1 drink of alcohol per day; avoid smoking/tobacco   Limit animal fats in diet for cholesterol and heart health - choose grass fed whenever available   Avoid highly processed foods, and foods  high in saturated/trans fats   Aim for low stress - take time to unwind and care for your mental health   Aim for 150 min of moderate intensity exercise weekly for heart health,  and weights twice weekly for bone health   Aim for 7-9 hours of sleep daily   When it comes to diets, agreement about the perfect plan isnt easy to find, even among the experts. Experts at the [employer] developed an idea known as the Healthy Eating Plate. Just imagine a plate divided into logical, healthy portions.   The emphasis is on diet quality:   Load up on vegetables and fruits - one-half of your plate: Aim for color and variety, and remember that potatoes dont count.   Go for whole grains - one-quarter of your plate: Whole wheat, barley, wheat berries, quinoa, oats, brown rice, and foods made with them. If you want pasta, go with whole wheat pasta.   Protein power - one-quarter of your plate: Fish, chicken, beans, and nuts are all healthy, versatile protein sources. Limit red meat.   The diet, however, does go beyond the plate, offering a few other suggestions.   Use healthy plant oils, such as olive, canola, soy, corn, sunflower and peanut. Check the labels, and avoid partially hydrogenated oil, which have unhealthy trans fats.   If youre thirsty, drink water. Coffee and tea are good in moderation, but skip sugary drinks and limit milk and dairy products to one or two daily servings.   The type of carbohydrate in the diet is more important than the amount. Some sources of carbohydrates, such as vegetables, fruits, whole grains, and beans-are healthier than others.   Finally, stay active  Signed, Berniece Salines, DO  01/09/2021 2:02 PM    [place]

## 2021-01-09 NOTE — Patient Instructions (Signed)
Medication Instructions:  Your physician has recommended you make the following change in your medication:  STOP: Irbesartan (AVAPRO) STOP: Verapamil (CALAN-SR)  START: Propranolol 10 mg twice daily *If you need a refill on your cardiac medications before your next appointment, please call your pharmacy*   Lab Work: Your physician recommends that you return for lab work: TODAY BMET, Mag, CBC, TSH  If you have labs (blood work) drawn today and your tests are completely normal, you will receive your results only by: Marland Kitchen MyChart Message (if you have MyChart) OR . A paper copy in the mail If you have any lab test that is abnormal or we need to change your treatment, we will call you to review the results.   Testing/Procedures: None   Follow-Up: At [facility], you and your health needs are our priority.  As part of our continuing mission to provide you with exceptional heart care, we have created designated Provider Care Teams.  These Care Teams include your primary Cardiologist (physician) and Advanced Practice Providers (APPs -  Physician Assistants and Nurse Practitioners) who all work together to provide you with the care you need, when you need it.  We recommend signing up for the patient portal called "MyChart".  Sign up information is provided on this After Visit Summary.  MyChart is used to connect with patients for Virtual Visits (Telemedicine).  Patients are able to view lab/test results, encounter notes, upcoming appointments, etc.  Non-urgent messages can be sent to your provider as well.   To learn more about what you can do with MyChart, go to [email or web address].    Your next appointment:   4 week(s)  The format for your next appointment:   In Person  Provider:   Berniece Salines, DO   Other Instructions

## 2021-01-10 LAB — CBC WITH DIFFERENTIAL/PLATELET
Basophils Absolute: 0 10*3/uL (ref 0.0–0.2)
Basos: 1 %
EOS (ABSOLUTE): 0.1 10*3/uL (ref 0.0–0.4)
Eos: 1 %
Hematocrit: 40.7 % (ref 34.0–46.6)
Hemoglobin: 13.8 g/dL (ref 11.1–15.9)
Immature Grans (Abs): 0 10*3/uL (ref 0.0–0.1)
Immature Granulocytes: 0 %
Lymphocytes Absolute: 0.8 10*3/uL (ref 0.7–3.1)
Lymphs: 16 %
MCH: 33.6 pg — ABNORMAL HIGH (ref 26.6–33.0)
MCHC: 33.9 g/dL (ref 31.5–35.7)
MCV: 99 fL — ABNORMAL HIGH (ref 79–97)
Monocytes Absolute: 0.5 10*3/uL (ref 0.1–0.9)
Monocytes: 10 %
Neutrophils Absolute: 3.5 10*3/uL (ref 1.4–7.0)
Neutrophils: 72 %
Platelets: 159 10*3/uL (ref 150–450)
RBC: 4.11 x10E6/uL (ref 3.77–5.28)
RDW: 12.5 % (ref 11.7–15.4)
WBC: 4.9 10*3/uL (ref 3.4–10.8)

## 2021-01-10 LAB — BASIC METABOLIC PANEL
BUN/Creatinine Ratio: 39 — ABNORMAL HIGH (ref 12–28)
BUN: 23 mg/dL (ref 10–36)
CO2: 21 mmol/L (ref 20–29)
Calcium: 9.7 mg/dL (ref 8.7–10.3)
Chloride: 99 mmol/L (ref 96–106)
Creatinine, Ser: 0.59 mg/dL (ref 0.57–1.00)
GFR calc Af Amer: 91 mL/min/{1.73_m2} (ref >59)
GFR calc non Af Amer: 79 mL/min/{1.73_m2} (ref >59)
Glucose: 104 mg/dL — ABNORMAL HIGH (ref 65–99)
Potassium: 4.7 mmol/L (ref 3.5–5.2)
Sodium: 136 mmol/L (ref 134–144)

## 2021-01-10 LAB — MAGNESIUM: Magnesium: 2.4 mg/dL — ABNORMAL HIGH (ref 1.6–2.3)

## 2021-01-10 LAB — TSH: TSH: 4.13 u[IU]/mL (ref 0.450–4.500)

## 2021-01-10 NOTE — Addendum Note (Signed)
Addended by: Berniece Salines on: 01/10/2021 01:47 PM   Modules accepted: Orders

## 2021-01-13 ENCOUNTER — Telehealth: Payer: Self-pay | Admitting: Cardiology

## 2021-01-13 NOTE — Telephone Encounter (Signed)
Patient is returning call to discuss lab results. 

## 2021-01-16 ENCOUNTER — Telehealth: Payer: Self-pay | Admitting: Cardiology

## 2021-01-16 NOTE — Telephone Encounter (Signed)
Patient took her medicine and the afternoon reading was 146/78 pulse 64. Patient does not want to take Irbesartan and Propranolol. Patient states "I was taken off of Beta Blockers and I am concerned about taking two beta blockers." "I have been having headaches and am off balance too sometimes" Patient instructed to continue current medication regimen and record her blood pressures over the weekend and send them in a MyChart message on Monday. Will rely the blood pressure readings to Dr. Harriet Masson.

## 2021-01-16 NOTE — Telephone Encounter (Signed)
   Pt c/o BP issue: STAT if pt c/o blurred vision, one-sided weakness or slurred speech  1. What are your last 5 BP readings? 174/94 HR 64 (before medication)  Pt states she has taken her BP multiple times over the past couple days and it has stayed in that range    2. Are you having any other symptoms (ex. Dizziness, headache, blurred vision, passed out)? Lightheaded but she is always lightheaded   3. What is your BP issue? Patient was just put on some new medication and has only been on it for 5 days.  She is not sure if that is OK before the weekend

## 2021-01-16 NOTE — Telephone Encounter (Signed)
Have her start irbesartan 75 mg daily.  Increase her propranolol to 20 mg twice daily.

## 2021-01-20 NOTE — Telephone Encounter (Signed)
Patient calling back. She states her daughter sent a mychart message with her BP readings. She states she does not check her mychart so she would like a call back. She states a message can be left with any instructions.

## 2021-01-27 NOTE — Telephone Encounter (Signed)
Called to talk to patient about the way she is taking he medications. No answer at this time. Left a message for patient to return call.

## 2021-02-13 ENCOUNTER — Other Ambulatory Visit: Payer: Self-pay

## 2021-02-13 ENCOUNTER — Ambulatory Visit: Payer: PPO | Admitting: Cardiology

## 2021-02-13 ENCOUNTER — Encounter: Payer: Self-pay | Admitting: Cardiology

## 2021-02-13 VITALS — BP 112/62 | HR 59 | Ht 65.0 in | Wt 165.1 lb

## 2021-02-13 DIAGNOSIS — H401111 Primary open-angle glaucoma, right eye, mild stage: Secondary | ICD-10-CM | POA: Diagnosis not present

## 2021-02-13 DIAGNOSIS — H401122 Primary open-angle glaucoma, left eye, moderate stage: Secondary | ICD-10-CM | POA: Diagnosis not present

## 2021-02-13 DIAGNOSIS — I491 Atrial premature depolarization: Secondary | ICD-10-CM | POA: Diagnosis not present

## 2021-02-13 DIAGNOSIS — E782 Mixed hyperlipidemia: Secondary | ICD-10-CM | POA: Diagnosis not present

## 2021-02-13 DIAGNOSIS — I471 Supraventricular tachycardia: Secondary | ICD-10-CM | POA: Diagnosis not present

## 2021-02-13 DIAGNOSIS — R0602 Shortness of breath: Secondary | ICD-10-CM

## 2021-02-13 DIAGNOSIS — H35363 Drusen (degenerative) of macula, bilateral: Secondary | ICD-10-CM | POA: Diagnosis not present

## 2021-02-13 DIAGNOSIS — H16223 Keratoconjunctivitis sicca, not specified as Sjogren's, bilateral: Secondary | ICD-10-CM | POA: Diagnosis not present

## 2021-02-13 DIAGNOSIS — I10 Essential (primary) hypertension: Secondary | ICD-10-CM | POA: Diagnosis not present

## 2021-02-13 DIAGNOSIS — H4912 Fourth [trochlear] nerve palsy, left eye: Secondary | ICD-10-CM | POA: Diagnosis not present

## 2021-02-13 DIAGNOSIS — I4719 Other supraventricular tachycardia: Secondary | ICD-10-CM

## 2021-02-13 HISTORY — DX: Shortness of breath: R06.02

## 2021-02-13 MED ORDER — VERAPAMIL HCL ER 120 MG PO TBCR: 120.0000 mg | EXTENDED_RELEASE_TABLET | Freq: Every day | ORAL | 3 refills | Status: DC

## 2021-02-13 NOTE — Progress Notes (Signed)
Cardiology Office Note:    Date:  02/13/2021   ID:  Carla Chandler, DOB 1926-01-27, MRN [id number]  PCP:  Carla Alar, NP  Cardiologist:  Carla Salines, DO  Electrophysiologist:  None   Referring MD: Carla Alar, NP   I am feeling really tired, I feel as if having alternating diarrhea and constipation constantly  History of Present Illness:    Carla Chandler is a 87 y.o. female with a hx of asthma, paroxysmal atrial fibrillation which was diagnosed over 20 years ago has not had any recurrent episodes and has been back in sinus rhythm, hyperlipidemia,hypertension. Patient is here today for follow-up visit. She is here today with her daughter.  I last saw the patient on 10/21/2020 and at that time she was experiencing some shortness of breath and dizziness. Based on her clinical exam, I suspected that her shortness breath may have been pulmonary in nature. We got a chest x-ray and blood work. The chest x-ray was normal. Her blood work showed hyperkalemia.   Her last visit was November 12, 2020 at that time she had reported that she was experiencing significant dizziness I stop her hydrochlorothiazide kept the patient on her irbesartan and verapamil.    I saw the patient on January 09, 2021 at that time we discussed her monitor which show frequent PACs while she was on verapamil.  Because of this was stopped verapamil and started the patient on propanolol 10 mg twice a day.  She also was hypotensive so we held her irbesartan.  In the interim the patient calls reporting that she was having elevated blood pressures, therefore we started the patient back on low-dose irbesartan 75 mg daily.  Today she is concerned she is feeling significantly fatigue and short of breath that she started propanolol.  She remembered that she was on beta-blockers in the past and she felt exactly the same way. The other problem she is having is intermittent constipation with diarrhea which  is not new she has been experiencing that for the last 4 to 6 months.  She has not seen her PCP.  Past Medical History:  Diagnosis Date  . Accelerated hypertension 12/04/2015  . AF (paroxysmal atrial fibrillation) ([place]) 06/12/2020  . Anxiety   . Bilateral impacted cerumen 02/12/2018  . Breast cancer ([place])    right  . 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 Cyst of joint of shoulder 12/20/2017  . DLBCL (diffuse large B cell lymphoma) ([place]) 02/29/2016  . Dr[place]en of macula of both eyes 02/29/2016  . Dry eyes, bilateral 02/29/2016  . Dyslipidemia, goal LDL below 130 10/26/2016  . Dysphagia 05/25/2015  . Dyspnea 05/25/2015  . Essential hypertension 05/27/2015  . Excessive cerumen in ear canal, bilateral 04/26/2017  . Gastroesophageal reflux disease 06/12/2020  . Glaucoma 06/12/2020  . History of B-cell lymphoma 10/26/2016  . History of breast cancer 06/12/2020  . 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 2013 rt radiation with balloon  . Hyperlipidemia   . Hypertension   . Hyponatremia 05/25/2015   Formatting of this note might be different from the original. Mild and likely 2ry to diuretic use  . Idiopathic peripheral neuropathy 10/26/2016  . Insomnia 06/12/2020  . Knee effusion, right 01/22/2016  . Laryngeal edema 05/25/2015  . Lymphoma ([place]) 05/05/2015  . Microcalcification of left breast on mammogram 09/27/2017  . Murmur, cardiac 07/11/2020  . Osteopenia of left  thigh 10/26/2016  . Other visual disturbances 12/04/2015  . PAF (paroxysmal atrial fibrillation) ([place]) 07/11/2020  . Poor balance 06/12/2020  . Primary open angle glaucoma of left eye, moderate stage 02/29/2016  . Primary osteoarthritis of both knees 01/22/2016  . Primary osteoarthritis of both shoulders 04/12/2017  . Primary osteoarthritis of left knee 01/11/2017  . Pseudophakia of both eyes 02/29/2016  . PVD (posterior vitreous detachment), both eyes 02/29/2016  .  Radiation injury 05/27/2015  . Right rotator cuff tear arthropathy 01/03/2018  . Sensorineural hearing loss (SNHL) of both ears 04/26/2017  . Varicose veins of left lower extremity 06/11/2020    Past Surgical History:  Procedure Laterality Date  . BLADDER SUSPENSION    . BREAST SURGERY Right 2008   lumpectomy  . LAPAROSCOPIC HYSTERECTOMY    . LYMPHADENECTOMY Right 2016    Current Medications: Current Meds  Medication Sig  . aspirin 81 MG EC tablet Take 81 mg by mouth daily.  Marland Kitchen b complex vitamins capsule Take 1 capsule by mouth daily.  . citalopram (CELEXA) 20 MG tablet Take 1 tablet (20 mg total) by mouth daily.  Marland Kitchen erythromycin ophthalmic ointment Place 1 application into both eyes at bedtime.  . irbesartan (AVAPRO) 150 MG tablet Take 75 mg by mouth daily. Take 1/2 tablet daily  . LORazepam (ATIVAN) 1 MG tablet TAKE 1 TABLET BY MOUTH AT BEDTIME  . propranolol (INDERAL) 10 MG tablet Take 1 tablet (10 mg total) by mouth 2 (two) times daily.  . verapamil (CALAN-SR) 120 MG CR tablet Take 1 tablet (120 mg total) by mouth at bedtime.  Marland Kitchen ZIOPTAN 0.0015 % SOLN      Allergies:   Bimatoprost, Ibuprofen, Levofloxacin, Penicillins, Statins, Atorvastatin, Brimonidine, Dorzolamide hcl-timolol mal, Lovastatin, and Perindopril   Social History   Socioeconomic History  . Marital status: Widowed    Spouse name: Not on file  . Number of children: Not on file  . Years of education: Not on file  . Highest education level: Not on file  Occupational History  . Occupation: Retired  Tobacco Use  . Smoking status: Former Research scientist (life sciences)  . Smokeless tobacco: Never Used  Vaping Use  . Vaping Use: Never used  Substance and Sexual Activity  . Alcohol use: Yes    Alcohol/week: 3.0 standard drinks    Types: 3 Glasses of wine per week    Comment: wine 3 x per week  . Drug use: Never  . Sexual activity: Not Currently  Other Topics Concern  . 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  ROS:   Review of Systems  Constitution: Negative for decreased appetite, fever and weight  gain.  HENT: Negative for congestion, ear discharge, hoarse voice and sore throat.   Eyes: Negative for discharge, redness, vision loss in right eye and visual halos.  Cardiovascular reports shortness of breath.  Negative for chest pain, leg swelling, orthopnea and palpitations.  Respiratory: Negative for cough, hemoptysis, shortness of breath and snoring.   Endocrine: Negative for heat intolerance and polyphagia.  Hematologic/Lymphatic: Negative for bleeding problem. Does not bruise/bleed easily.  Skin: Negative for flushing, nail changes, rash and suspicious lesions.  Musculoskeletal: Negative for arthritis, joint pain, muscle cramps, myalgias, neck pain and stiffness.  Gastrointestinal: Report intermittent diarrhea and constipation negative for abdominal pain, bowel incontinence,and excessive appetite.  Genitourinary: Negative for decreased libido, genital sores and incomplete emptying.  Neurological: Negative for brief paralysis, focal weakness, headaches and loss of balance.  Psychiatric/Behavioral: Negative for altered mental status, depression and suicidal ideas.  Allergic/Immunologic: Negative for HIV exposure and  persistent infections.    EKGs/Labs/Other Studies Reviewed:    The following studies were reviewed today:   EKG: None today  ZIO monitor ZIO monitor The patient wore the monitor for 14 days starting 11/12/2020. Indication: Paroxysmal atrial fibrillation   The minimum heart rate was 55 bpm, maximum heart rate was 182 bpm, and average heart rate was 79 bpm. Predominant underlying rhythm was Sinus Rhythm.  39767 Supraventricular Tachycardia runs occurred, the run with the fastest interval lasting 7 beats with a maximum rate of 182 bpm, the longest lasting 48.6 secs with an avg rate of 108 bpm.  Premature atrial complexes were frequent (8.4%, [id number]). Premature Ventricular complexes were rare (<1%).   No ventricular tachycardia, No pauses, No AV block and no atrial fibrillation present. 9 patient triggered events and 7 diary events all associated with premature atrial complexes.   Conclusion: This study is remarkable for the following:  1. Paroxysmal supraventricular tachycardia which is likely atrial tachycardia with variable block.  2. Frequent Premature atrial complexes.    Recent Labs: 06/11/2020: ALT 12 01/09/2021: BUN 23; Creatinine, Ser 0.59; Hemoglobin 13.8; Magnesium 2.4; Platelets 159; Potassium 4.7; Sodium 136; TSH 4.130  Recent Lipid Panel No results found for: CHOL, TRIG, HDL, CHOLHDL, VLDL, LDLCALC, LDLDIRECT  Physical Exam:    VS:  BP 112/62   Pulse (!) 59   Ht 5\' 5"  (1.651 m)   Wt 165 lb 1.3 oz (74.9 kg)   SpO2 95%   BMI 27.47 kg/m     Wt Readings from Last 3 Encounters:  02/13/21 165 lb 1.3 oz (74.9 kg)  01/09/21 167 lb (75.8 kg)  11/12/20 167 lb (75.8 kg)     GEN: Well nourished, well developed in no acute distress HEENT: Normal NECK: No JVD; No carotid bruits LYMPHATICS: No lymphadenopathy CARDIAC: S1S2 noted,RRR, no murmurs, rubs, gallops RESPIRATORY:  Clear to auscultation without  rales, wheezing or rhonchi  ABDOMEN: Soft, non-tender, non-distended, +bowel sounds, no guarding. EXTREMITIES: No edema, No cyanosis, no clubbing MUSCULOSKELETAL:  No deformity  SKIN: Warm and dry NEUROLOGIC:  Alert and oriented x 3, non-focal PSYCHIATRIC:  Normal affect, good insight  ASSESSMENT:    1. Hypertension, unspecified type   2. Frequent PAC (premature atrial contraction)   3. PAT (paroxysmal atrial tachycardia) ([place])   4. Mixed hyperlipidemia   5. Shortness of breath    PLAN:     1.  Given the fact that she is having significant reaction to the propanolol which has made her fatigued and short of breath like when she had beta-blocker in the past we  can stop this medication.  Put her back on her verapamil hopefully we can titrate up to control her PACs.  2.  Observe her blood pressure she has had some diastolic hypertension so we are going to keep her on her irbesartan and hopefully watch what her blood pressure does with the verapamil.  I am being very cautious as the patient does have times where she has systolic blood pressure that is close to low 100 and she feels dizzy.  3.  I encouraged the patient she needs to see her primary provider for her GI issues does not have any fever suspected may be colitis may be playing a role here.  She plans to call for an appointment.  4.  Blood work will be done today to recheck her CBC BMP and mag.  The patient is in agreement with the above plan. The patient left the office in stable condition.  The patient will follow up in   Medication Adjustments/Labs and Tests Ordered: Current medicines are reviewed at length with the patient today.  Concerns regarding medicines are outlined above.  Orders Placed This Encounter  Procedures  . Basic metabolic panel  . Magnesium  . CBC with Differential/Platelet   Meds ordered this encounter  Medications  . verapamil (CALAN-SR) 120 MG CR tablet    Sig: Take 1 tablet (120 mg total) by mouth  at bedtime.    Dispense:  90 tablet    Refill:  3    Patient Instructions  Medication Instructions:  Your physician has recommended you make the following change in your medication: STOP: Propranolol  START: Verapamil 120 mg once daily  *If you need a refill on your cardiac medications before your next appointment, please call your pharmacy*   Lab Work: Your physician recommends that you return for lab work: TODAY: BMET, Mag, CBC If you have labs (blood work) drawn today and your tests are completely normal, you will receive your results only by: Marland Kitchen MyChart Message (if you have MyChart) OR . A paper copy in the mail If you have any lab test that is abnormal or we need to change your treatment, we will call you to review the results.   Testing/Procedures: None   Follow-Up: At [facility], you and your health needs are our priority.  As part of our continuing mission to provide you with exceptional heart care, we have created designated Provider Care Teams.  These Care Teams include your primary Cardiologist (physician) and Advanced Practice Providers (APPs -  Physician Assistants and Nurse Practitioners) who all work together to provide you with the care you need, when you need it.  We recommend signing up for the patient portal called "MyChart".  Sign up information is provided on this After Visit Summary.  MyChart is used to connect with patients for Virtual Visits (Telemedicine).  Patients are able to view lab/test results, encounter notes, upcoming appointments, etc.  Non-urgent messages can be sent to your provider as well.   To learn more about what you can do with MyChart, go to [email or web address].    Your next appointment:   4 week(s)  The format for your next appointment:   In Person  Provider:   Berniece Salines, DO   Other Instructions      Adopting a Healthy Lifestyle.  Know what a healthy weight is for you (roughly BMI <25) and aim to maintain this    Aim for 7+ servings of fruits and vegetables daily   65-80+ fluid  ounces of water or unsweet tea for healthy kidneys   Limit to max 1 drink of alcohol per day; avoid smoking/tobacco   Limit animal fats in diet for cholesterol and heart health - choose grass fed whenever available   Avoid highly processed foods, and foods high in saturated/trans fats   Aim for low stress - take time to unwind and care for your mental health   Aim for 150 min of moderate intensity exercise weekly for heart health, and weights twice weekly for bone health   Aim for 7-9 hours of sleep daily   When it comes to diets, agreement about the perfect plan isnt easy to find, even among the experts. Experts at the [employer] developed an idea known as the Healthy Eating Plate. Just imagine a plate divided into logical, healthy portions.   The emphasis is on diet quality:   Load up on vegetables and fruits - one-half of your plate: Aim for color and variety, and remember that potatoes dont count.   Go for whole grains - one-quarter of your plate: Whole wheat, barley, wheat berries, quinoa, oats, brown rice, and foods made with them. If you want pasta, go with whole wheat pasta.   Protein power - one-quarter of your plate: Fish, chicken, beans, and nuts are all healthy, versatile protein sources. Limit red meat.   The diet, however, does go beyond the plate, offering a few other suggestions.   Use healthy plant oils, such as olive, canola, soy, corn, sunflower and peanut. Check the labels, and avoid partially hydrogenated oil, which have unhealthy trans fats.   If youre thirsty, drink water. Coffee and tea are good in moderation, but skip sugary drinks and limit milk and dairy products to one or two daily servings.   The type of carbohydrate in the diet is more important than the amount. Some sources of carbohydrates, such as vegetables, fruits, whole grains, and beans-are healthier than  others.   Finally, stay active  Signed, Carla Salines, DO  02/13/2021 12:19 PM    [facility]

## 2021-02-13 NOTE — Patient Instructions (Addendum)
Medication Instructions:  Your physician has recommended you make the following change in your medication: STOP: Propranolol  START: Verapamil 120 mg once daily  *If you need a refill on your cardiac medications before your next appointment, please call your pharmacy*   Lab Work: Your physician recommends that you return for lab work: TODAY: BMET, Mag, CBC If you have labs (blood work) drawn today and your tests are completely normal, you will receive your results only by: Marland Kitchen MyChart Message (if you have MyChart) OR . A paper copy in the mail If you have any lab test that is abnormal or we need to change your treatment, we will call you to review the results.   Testing/Procedures: None   Follow-Up: At [facility], you and your health needs are our priority.  As part of our continuing mission to provide you with exceptional heart care, we have created designated Provider Care Teams.  These Care Teams include your primary Cardiologist (physician) and Advanced Practice Providers (APPs -  Physician Assistants and Nurse Practitioners) who all work together to provide you with the care you need, when you need it.  We recommend signing up for the patient portal called "MyChart".  Sign up information is provided on this After Visit Summary.  MyChart is used to connect with patients for Virtual Visits (Telemedicine).  Patients are able to view lab/test results, encounter notes, upcoming appointments, etc.  Non-urgent messages can be sent to your provider as well.   To learn more about what you can do with MyChart, go to [email or web address].    Your next appointment:   4 week(s)  The format for your next appointment:   In Person  Provider:   Berniece Salines, DO   Other Instructions

## 2021-02-14 LAB — CBC WITH DIFFERENTIAL/PLATELET
Basophils Absolute: 0 10*3/uL (ref 0.0–0.2)
Basos: 1 %
EOS (ABSOLUTE): 0.1 10*3/uL (ref 0.0–0.4)
Eos: 2 %
Hematocrit: 39.4 % (ref 34.0–46.6)
Hemoglobin: 13.7 g/dL (ref 11.1–15.9)
Immature Grans (Abs): 0 10*3/uL (ref 0.0–0.1)
Immature Granulocytes: 0 %
Lymphocytes Absolute: 0.8 10*3/uL (ref 0.7–3.1)
Lymphs: 17 %
MCH: 34.6 pg — ABNORMAL HIGH (ref 26.6–33.0)
MCHC: 34.8 g/dL (ref 31.5–35.7)
MCV: 100 fL — ABNORMAL HIGH (ref 79–97)
Monocytes Absolute: 0.5 10*3/uL (ref 0.1–0.9)
Monocytes: 10 %
Neutrophils Absolute: 3.5 10*3/uL (ref 1.4–7.0)
Neutrophils: 70 %
Platelets: 137 10*3/uL — ABNORMAL LOW (ref 150–450)
RBC: 3.96 x10E6/uL (ref 3.77–5.28)
RDW: 12.5 % (ref 11.7–15.4)
WBC: 4.9 10*3/uL (ref 3.4–10.8)

## 2021-02-14 LAB — BASIC METABOLIC PANEL
BUN/Creatinine Ratio: 51 — ABNORMAL HIGH (ref 12–28)
BUN: 28 mg/dL (ref 10–36)
CO2: 23 mmol/L (ref 20–29)
Calcium: 9.6 mg/dL (ref 8.7–10.3)
Chloride: 102 mmol/L (ref 96–106)
Creatinine, Ser: 0.55 mg/dL — ABNORMAL LOW (ref 0.57–1.00)
Glucose: 114 mg/dL — ABNORMAL HIGH (ref 65–99)
Potassium: 4.5 mmol/L (ref 3.5–5.2)
Sodium: 140 mmol/L (ref 134–144)
eGFR: 85 mL/min/{1.73_m2} (ref >59)

## 2021-02-14 LAB — MAGNESIUM: Magnesium: 2.5 mg/dL — ABNORMAL HIGH (ref 1.6–2.3)

## 2021-02-16 ENCOUNTER — Encounter: Payer: Self-pay | Admitting: Family

## 2021-02-16 ENCOUNTER — Telehealth (INDEPENDENT_AMBULATORY_CARE_PROVIDER_SITE_OTHER): Payer: PPO | Admitting: Family

## 2021-02-16 ENCOUNTER — Other Ambulatory Visit: Payer: Self-pay

## 2021-02-16 ENCOUNTER — Other Ambulatory Visit: Payer: Self-pay | Admitting: Family

## 2021-02-16 VITALS — BP 134/100 | HR 100

## 2021-02-16 DIAGNOSIS — F419 Anxiety disorder, unspecified: Secondary | ICD-10-CM | POA: Diagnosis not present

## 2021-02-16 DIAGNOSIS — I480 Paroxysmal atrial fibrillation: Secondary | ICD-10-CM | POA: Diagnosis not present

## 2021-02-16 DIAGNOSIS — H409 Unspecified glaucoma: Secondary | ICD-10-CM | POA: Diagnosis not present

## 2021-02-16 DIAGNOSIS — I10 Essential (primary) hypertension: Secondary | ICD-10-CM | POA: Diagnosis not present

## 2021-02-16 DIAGNOSIS — R1013 Epigastric pain: Secondary | ICD-10-CM | POA: Diagnosis not present

## 2021-02-16 MED ORDER — FAMOTIDINE 20 MG PO TABS: 20.0000 mg | ORAL_TABLET | Freq: Two times a day (BID) | ORAL | 2 refills | Status: DC

## 2021-02-16 NOTE — Progress Notes (Signed)
Virtual Visit via Telephone Note  I connected with Carla Chandler on 02/16/21 at  7:40 AM EDT by telephone and verified that I am speaking with the correct person using two identifiers.  Location: Patient: home Provider: work   I discussed the limitations, risks, security and privacy concerns of performing an evaluation and management service by telephone and the availability of in person appointments. I also discussed with the patient that there may be a patient responsible charge related to this service. The patient expressed understanding and agreed to proceed.   History of Present Illness:  Patient is a 87 yr old female who presents today for a telephone visit.   HTN- maintained on avapro 75mg , verabapin 120mg .  Reports that AM BP is usually elevated. She is scheduled for follow up with cardiology. BP Readings from Last 3 Encounters:  02/16/21 (!) 134/100  02/13/21 112/62  01/09/21 92/60   B-cell Lymphoma- followed by Dr. Verner Chol.  Anxiety- citalopram 20mg . Son has brain cancer. She uses lorazepam at night.   Glaucoma- followed by Dr. Antionette Fairy.  PAF- She was given propanolol but it made her fatigued and SOB. She was placed back on verapamil by cardiology.   Reports that she has constipation alternating with diarrhea. She reports that she has some nausea.  Reports some chronic abdominal pain.  Pain is in the epigastric region.    Observations/Objective:   Gen: Awake, alert, no acute distress Resp: Breathing sounds even and non-labored Psych: calm/pleasant demeanor Neuro: Alert and Oriented x 3, speech sounds clear.  Assessment and Plan:  Anxiety- worsened recently by her son's terminal brain cancer diagnosis.  Continue citalopram 20mg  and as lorazepam 1mg  HS PRN.  Glaucoma- managed by ophthalmology.  HTN- bp is elevated today. Defer med management to cardiology.  PAF- currently on diltiazem/asa 81mg . Management per cardiology.  Epigastric abdominal  pain- she was previously intolerant to omeprazole (caused worsening diarrhea).  I suspect gerd is contributing. Will give trial of pepcid 20mg  twice daily.  In regards to her constipation alternating with diarrhea, this sounds like IBS.  She uses miralax as needed for constipation.  In addition, I suggested referral to GI. She declined at this time due to transportation issues.   B-cell lymphoma- management per oncology.  Follow Up Instructions:    I discussed the assessment and treatment plan with the patient. The patient was provided an opportunity to ask questions and all were answered. The patient agreed with the plan and demonstrated an understanding of the instructions.   The patient was advised to call back or seek an in-person evaluation if the symptoms worsen or if the condition fails to improve as anticipated.  I provided 15 minutes of non-face-to-face time during this encounter.   Nance Pear, NP

## 2021-03-05 DIAGNOSIS — L219 Seborrheic dermatitis, unspecified: Secondary | ICD-10-CM | POA: Diagnosis not present

## 2021-03-05 DIAGNOSIS — L821 Other seborrheic keratosis: Secondary | ICD-10-CM | POA: Diagnosis not present

## 2021-03-09 ENCOUNTER — Other Ambulatory Visit: Payer: Self-pay

## 2021-03-10 DIAGNOSIS — H6123 Impacted cerumen, bilateral: Secondary | ICD-10-CM | POA: Diagnosis not present

## 2021-03-10 DIAGNOSIS — H903 Sensorineural hearing loss, bilateral: Secondary | ICD-10-CM | POA: Diagnosis not present

## 2021-03-12 ENCOUNTER — Other Ambulatory Visit: Payer: Self-pay

## 2021-03-12 ENCOUNTER — Ambulatory Visit: Payer: PPO | Admitting: Cardiology

## 2021-03-12 ENCOUNTER — Encounter: Payer: Self-pay | Admitting: Cardiology

## 2021-03-12 VITALS — BP 120/62 | HR 72 | Ht 65.0 in | Wt 166.0 lb

## 2021-03-12 DIAGNOSIS — I471 Supraventricular tachycardia: Secondary | ICD-10-CM | POA: Diagnosis not present

## 2021-03-12 DIAGNOSIS — I491 Atrial premature depolarization: Secondary | ICD-10-CM | POA: Diagnosis not present

## 2021-03-12 DIAGNOSIS — I4719 Other supraventricular tachycardia: Secondary | ICD-10-CM

## 2021-03-12 DIAGNOSIS — I480 Paroxysmal atrial fibrillation: Secondary | ICD-10-CM | POA: Diagnosis not present

## 2021-03-12 DIAGNOSIS — I10 Essential (primary) hypertension: Secondary | ICD-10-CM | POA: Diagnosis not present

## 2021-03-12 MED ORDER — CLONIDINE HCL 0.1 MG PO TABS: 0.1000 mg | ORAL_TABLET | Freq: Every evening | ORAL | 11 refills | Status: DC

## 2021-03-12 NOTE — Patient Instructions (Signed)
Medication Instructions:  Your physician has recommended you make the following change in your medication:  START: Clonidine 0.1 mg at bedtime *If you need a refill on your cardiac medications before your next appointment, please call your pharmacy*   Lab Work: None If you have labs (blood work) drawn today and your tests are completely normal, you will receive your results only by: Marland Kitchen MyChart Message (if you have MyChart) OR . A paper copy in the mail If you have any lab test that is abnormal or we need to change your treatment, we will call you to review the results.   Testing/Procedures: None   Follow-Up: At [facility], you and your health needs are our priority.  As part of our continuing mission to provide you with exceptional heart care, we have created designated Provider Care Teams.  These Care Teams include your primary Cardiologist (physician) and Advanced Practice Providers (APPs -  Physician Assistants and Nurse Practitioners) who all work together to provide you with the care you need, when you need it.  We recommend signing up for the patient portal called "MyChart".  Sign up information is provided on this After Visit Summary.  MyChart is used to connect with patients for Virtual Visits (Telemedicine).  Patients are able to view lab/test results, encounter notes, upcoming appointments, etc.  Non-urgent messages can be sent to your provider as well.   To learn more about what you can do with MyChart, go to [email or web address].    Your next appointment:   3 month(s)  The format for your next appointment:   In Person  Provider:   Berniece Salines, DO   Other Instructions

## 2021-03-12 NOTE — Progress Notes (Signed)
Cardiology Office Note:    Date:  03/12/2021   ID:  KASSADEE CARAWAN, DOB [birth year], MRN [id number]  PCP:  [name], NP  Cardiologist:  Berniece Salines, DO  Electrophysiologist:  None   Referring MD: [name], NP     History of Present Illness:    Carla Chandler is a 87 y.o. female with a hx of asthma, paroxysmal atrial fibrillation which was diagnosed over 20 years ago has not had any recurrent episodes and has been back in sinus rhythm, hyperlipidemia,hypertension. Patient is here today for follow-up visit. She is here today with her daughter.  I last saw the patient on 10/21/2020 and at that time she was experiencing some shortness of breath and dizziness. Based on her clinical exam, I suspected that her shortness breath may have been pulmonary in nature. We got a chest x-ray and blood work. The chest x-ray was normal. Her blood work showed hyperkalemia.  Her last visit was November 12, 2020 at that time she had reported that she was experiencing significant dizziness I stop her hydrochlorothiazide kept the patient on her irbesartan and verapamil.   I saw the patient on January 09, 2021 at that time we discussed her monitor which show frequent PACs while she was on verapamil.  Because of this was stopped verapamil and started the patient on propanolol 10 mg twice a day.  She also was hypotensive so we held her irbesartan.  I saw the patient on 02/13/2021 at that time she had had significant A. fib was unable to do any of her ADLs.  Stopped the propanolol for the patient and we started.  She is not having significant palpitations.  But her blood pressure.  Also experiencing some GI issues and recommended that she see her PCP.  Patient is here for follow-up visit.  She is asking patient experiencing elevated blood pressure at home.  She lost her son and she is going to the funeral tomorrow. I express my sympathy to the patient for the loss of her  son.  Past Medical History:  Diagnosis Date  . Accelerated hypertension 12/04/2015  . AF (paroxysmal atrial fibrillation) ([facility]) 06/12/2020  . Anxiety   . Bilateral impacted cerumen 02/12/2018  . Breast cancer ([facility])    right  . 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 Cyst of joint of shoulder 12/20/2017  . Dizziness 11/12/2020  . DLBCL (diffuse large B cell lymphoma) ([facility]) 02/29/2016  . [place] of macula of both eyes 02/29/2016  . Dry eyes, bilateral 02/29/2016  . Dyslipidemia, goal LDL below 130 10/26/2016  . Dysphagia 05/25/2015  . Dyspnea 05/25/2015  . Essential hypertension 05/27/2015  . Excessive cerumen in ear canal, bilateral 04/26/2017  . Frequent PAC (premature atrial contraction) 01/09/2021  . Gastroesophageal reflux disease 06/12/2020  . Glaucoma 06/12/2020  . History of B-cell lymphoma 10/26/2016  . History of breast cancer 06/12/2020  . 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 2013 rt radiation with balloon  . Hyperlipidemia   . Hypertension   . Hyponatremia 05/25/2015   Formatting of this note might be different from the original. Mild and likely 2ry to diuretic use  . Idiopathic peripheral neuropathy 10/26/2016  . Insomnia 06/12/2020  . Knee effusion, right 01/22/2016  . Laryngeal edema 05/25/2015  . Lymphoma ([facility]) 05/05/2015  . Microcalcification of left breast on mammogram 09/27/2017  . Murmur, cardiac 07/11/2020  . Osteopenia  of left thigh 10/26/2016  . Other visual disturbances 12/04/2015  . PAF (paroxysmal atrial fibrillation) ([facility]) 07/11/2020  . PAT (paroxysmal atrial tachycardia) ([facility]) 01/09/2021  . Poor balance 06/12/2020  . Primary hypertension 11/12/2020  . Primary open angle glaucoma of left eye, moderate stage 02/29/2016  . Primary osteoarthritis of both knees 01/22/2016  . Primary osteoarthritis of both shoulders 04/12/2017  . Primary osteoarthritis of left knee 01/11/2017  . Pseudophakia of  both eyes 02/29/2016  . PVD (posterior vitreous detachment), both eyes 02/29/2016  . Radiation injury 05/27/2015  . Right rotator cuff tear arthropathy 01/03/2018  . Sensorineural hearing loss (SNHL) of both ears 04/26/2017  . Shortness of breath 02/13/2021  . Varicose veins of left lower extremity 06/11/2020    Past Surgical History:  Procedure Laterality Date  . BLADDER SUSPENSION    . BREAST SURGERY Right 2008   lumpectomy  . LAPAROSCOPIC HYSTERECTOMY    . LYMPHADENECTOMY Right 2016    Current Medications: Current Meds  Medication Sig  . b complex vitamins capsule Take 1 capsule by mouth daily.  . citalopram (CELEXA) 20 MG tablet Take 1 tablet by mouth once daily  . cloNIDine (CATAPRES) 0.1 MG tablet Take 1 tablet (0.1 mg total) by mouth at bedtime.  Marland Kitchen erythromycin ophthalmic ointment Place 1 application into both eyes at bedtime.  . famotidine (PEPCID) 20 MG tablet Take 1 tablet (20 mg total) by mouth 2 (two) times daily.  . fluconazole (DIFLUCAN) 200 MG tablet Take 200 mg by mouth every other day.  . irbesartan (AVAPRO) 150 MG tablet Take 75 mg by mouth daily. Take 1/2 tablet daily  . LORazepam (ATIVAN) 1 MG tablet TAKE 1 TABLET BY MOUTH AT BEDTIME  . Tafluprost, PF, (ZIOPTAN) 0.0015 % SOLN Place 1 drop into both eyes at bedtime.  . verapamil (CALAN-SR) 120 MG CR tablet Take 1 tablet (120 mg total) by mouth at bedtime.     Allergies:   Bimatoprost, Ibuprofen, Levofloxacin, Penicillins, Shellfish allergy, Statins, Atorvastatin, Brimonidine, Dorzolamide hcl-timolol mal, Lovastatin, and Perindopril   Social History   Socioeconomic History  . Marital status: Widowed    Spouse name: Not on file  . Number of children: Not on file  . Years of education: Not on file  . Highest education level: Not on file  Occupational History  . Occupation: Retired  Tobacco Use  . Smoking status: Former Research scientist (life sciences)  . Smokeless tobacco: Never Used  Vaping Use  . Vaping Use: Never used  Substance and  Sexual Activity  . Alcohol use: Yes    Alcohol/week: 3.0 standard drinks    Types: 3 Glasses of wine per week    Comment: wine 3 x per week  . Drug use: Never  . Sexual activity: Not Currently  Other Topics Concern  . 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  ROS:   Review of Systems  Constitution: Negative for decreased appetite, fever and weight gain.  HENT: Negative for congestion, ear discharge, hoarse voice and sore throat.   Eyes: Negative for discharge, redness, vision loss in right eye and visual halos.  Cardiovascular: Negative for chest pain, dyspnea on exertion, leg swelling, orthopnea and palpitations.  Respiratory: Negative for cough, hemoptysis, shortness of breath and snoring.   Endocrine: Negative for heat intolerance and polyphagia.  Hematologic/Lymphatic: Negative for bleeding problem. Does not bruise/bleed easily.  Skin: Negative for flushing, nail changes, rash and suspicious lesions.  Musculoskeletal: Negative for arthritis, joint pain, muscle cramps, myalgias, neck pain and stiffness.  Gastrointestinal: Negative for abdominal pain, bowel incontinence, diarrhea and excessive appetite.  Genitourinary: Negative for decreased libido, genital sores and incomplete  emptying.  Neurological: Negative for brief paralysis, focal weakness, headaches and loss of balance.  Psychiatric/Behavioral: Negative for altered mental status, depression and suicidal ideas.  Allergic/Immunologic: Negative for HIV exposure and persistent infections.    EKGs/Labs/Other Studies Reviewed:    The following studies were reviewed today:   EKG:  The ekg ordered today demonstrates   Recent Labs: 06/11/2020: ALT 12 01/09/2021: TSH 4.130 02/13/2021: BUN 28; Creatinine, Ser 0.55; Hemoglobin 13.7; Magnesium 2.5; Platelets 137; Potassium 4.5; Sodium 140  Recent Lipid Panel No results found for: CHOL, TRIG, HDL, CHOLHDL, VLDL, LDLCALC, LDLDIRECT  Physical Exam:    VS:  BP 120/62   Pulse 72   Ht 5\' 5"  (1.651 m)   Wt 166 lb (75.3 kg)   SpO2 96%   BMI 27.62 kg/m     Wt Readings from Last 3 Encounters:  03/12/21 166 lb (75.3 kg)  02/13/21 165 lb 1.3 oz (74.9 kg)  01/09/21 167 lb (75.8 kg)     GEN: Well nourished, well developed in no acute distress HEENT: Normal NECK: No JVD; No carotid bruits LYMPHATICS: No lymphadenopathy CARDIAC: S1S2 noted,RRR, no murmurs, rubs, gallops RESPIRATORY:  Clear to auscultation without rales, wheezing or rhonchi  ABDOMEN: Soft, non-tender, non-distended, +bowel sounds, no guarding. EXTREMITIES: No edema, No cyanosis, no clubbing MUSCULOSKELETAL:  No deformity  SKIN: Warm and dry NEUROLOGIC:  Alert and oriented x 3, non-focal PSYCHIATRIC:  Normal affect, good insight  ASSESSMENT:    1. Essential hypertension   2. Frequent PAC (premature atrial contraction)   3. PAF (paroxysmal atrial fibrillation) ([facility])   4. PAT (paroxysmal atrial tachycardia) ([facility])    PLAN:     I manually took the patient's blood pressure in the office 130/72 mmHg, blood pressure cuff was 129/70 millimeters. She showed me blood pressure reading from home and she is mostly in the 160s sbp and high 90s. I will give the patient clonidine 0.1 mg at bedtime. I hope  this can cover her blood pressure for better control at night.   We have talked about the use in [facility] in the past but patient deferred.   Continue with verapamil.   The patient is in agreement with the above plan. The patient left the office in stable condition.  The patient will follow up in   Medication Adjustments/Labs and Tests Ordered: Current medicines are reviewed at length with the patient today.  Concerns regarding medicines are outlined above.  No orders of the defined types were placed in this encounter.  Meds ordered this encounter  Medications  . cloNIDine (CATAPRES) 0.1 MG tablet    Sig: Take 1 tablet (0.1 mg total) by mouth at bedtime.    Dispense:  60 tablet    Refill:  11    Patient Instructions  Medication Instructions:  Your physician has recommended you make the following change in your medication:  START: Clonidine 0.1 mg at bedtime *If you need a refill on your cardiac medications before your next appointment, please call your pharmacy*   Lab Work: None If you have labs (blood work) drawn today and your tests are completely normal, you will receive your results only by: Marland Kitchen MyChart Message (if you have MyChart) OR . A paper copy in the mail If you have any lab test that is abnormal or we need to change your treatment, we will call you to review the results.   Testing/Procedures: None   Follow-Up: At [facility], you and your health needs are our priority.  As part of our continuing mission to provide you with exceptional heart care, we have created designated Provider Care Teams.  These Care Teams include your primary Cardiologist (physician) and Advanced Practice Providers (APPs -  Physician Assistants and Nurse Practitioners) who all work together to provide you with the care you need, when you need it.  We recommend signing up for the patient portal called "MyChart".  Sign up information is provided on this After Visit Summary.  MyChart is used to  connect with patients for Virtual Visits (Telemedicine).  Patients are able to view lab/test results, encounter notes, upcoming appointments, etc.  Non-urgent messages can be sent to your provider as well.   To learn more about what you can do with MyChart, go to [email or web address].    Your next appointment:   3 month(s)  The format for your next appointment:   In Person  Provider:   Berniece Salines, DO   Other Instructions      Adopting a Healthy Lifestyle.  Know what a healthy weight is for you (roughly BMI <25) and aim to maintain this   Aim for 7+ servings of fruits and vegetables daily   65-80+ fluid ounces of water or unsweet tea for healthy kidneys   Limit to max 1 drink of alcohol per day; avoid smoking/tobacco   Limit animal fats in diet for cholesterol and heart health - choose grass fed whenever available   Avoid highly processed foods, and foods high in saturated/trans fats   Aim for low stress - take time to unwind and care for your mental health   Aim for 150 min of moderate intensity exercise weekly for heart health, and weights twice weekly for bone health   Aim for 7-9 hours of sleep daily   When it comes to diets, agreement about the perfect plan isnt easy to find, even among the experts. Experts at the [employer] [place] developed an idea known as the Healthy Eating Plate. Just imagine a plate divided into logical, healthy portions.   The emphasis is on diet quality:   Load up on vegetables and fruits - one-half of your plate: Aim for color and variety, and remember that potatoes dont count.   Go for whole grains - one-quarter of your plate: Whole wheat, barley, wheat berries, quinoa, oats, brown rice, and foods made with them. If you want pasta, go with whole wheat pasta.   Protein power - one-quarter of your plate: Fish, chicken, beans, and nuts are all  healthy, versatile protein sources. Limit red meat.   The diet, however, does  go beyond the plate, offering a few other suggestions.   Use healthy plant oils, such as olive, canola, soy, corn, sunflower and peanut. Check the labels, and avoid partially hydrogenated oil, which have unhealthy trans fats.   If youre thirsty, drink water. Coffee and tea are good in moderation, but skip sugary drinks and limit milk and dairy products to one or two daily servings.   The type of carbohydrate in the diet is more important than the amount. Some sources of carbohydrates, such as vegetables, fruits, whole grains, and beans-are healthier than others.   Finally, stay active  Signed, Berniece Salines, DO  03/12/2021 9:41 AM    [place]

## 2021-03-16 ENCOUNTER — Other Ambulatory Visit: Payer: Self-pay | Admitting: Family

## 2021-04-06 MED ORDER — IRBESARTAN 75 MG PO TABS: 75.0000 mg | ORAL_TABLET | Freq: Every day | ORAL | 3 refills | Status: DC

## 2021-04-10 ENCOUNTER — Ambulatory Visit (INDEPENDENT_AMBULATORY_CARE_PROVIDER_SITE_OTHER): Payer: PPO | Admitting: Family

## 2021-04-10 ENCOUNTER — Other Ambulatory Visit: Payer: Self-pay

## 2021-04-10 ENCOUNTER — Encounter: Payer: Self-pay | Admitting: Family

## 2021-04-10 VITALS — BP 115/65 | HR 75 | Temp 98.8°F | Resp 16 | Ht 65.0 in | Wt 165.8 lb

## 2021-04-10 DIAGNOSIS — L989 Disorder of the skin and subcutaneous tissue, unspecified: Secondary | ICD-10-CM

## 2021-04-10 DIAGNOSIS — M1711 Unilateral primary osteoarthritis, right knee: Secondary | ICD-10-CM | POA: Diagnosis not present

## 2021-04-10 DIAGNOSIS — I10 Essential (primary) hypertension: Secondary | ICD-10-CM | POA: Diagnosis not present

## 2021-04-10 DIAGNOSIS — J309 Allergic rhinitis, unspecified: Secondary | ICD-10-CM

## 2021-04-10 DIAGNOSIS — K219 Gastro-esophageal reflux disease without esophagitis: Secondary | ICD-10-CM | POA: Diagnosis not present

## 2021-04-10 NOTE — Assessment & Plan Note (Signed)
Recommended that she add tylenol 1000mg  po daily in the am for pain.

## 2021-04-10 NOTE — Assessment & Plan Note (Signed)
Stable/improved on famotidine 20mg  bid.

## 2021-04-10 NOTE — Assessment & Plan Note (Signed)
Uncontrolled. Suspect cough is secondary to drainage. Will add claritin 10mg  once daily.

## 2021-04-10 NOTE — Assessment & Plan Note (Signed)
BP stable/improved. Continue catapres 0.1 mg at bedtime, avapro 75mg , verapamil 120mg .

## 2021-04-10 NOTE — Assessment & Plan Note (Signed)
New.  Refer to dermatology. 

## 2021-04-10 NOTE — Patient Instructions (Addendum)
Please add claritin 10 mg once daily for drainage. You can try adding tylenol 1000mg  once daily in the AM for arthritis pain.

## 2021-04-10 NOTE — Progress Notes (Signed)
Subjective:   By signing my name below, I, Carla Chandler, attest that this documentation has been prepared under the direction and in the presence of Carla Alar NP. 04/10/2021      Patient ID: Carla Chandler, female    DOB: 08/06/1926, 87 y.o.   MRN: [id number]  No chief complaint on file.   HPI Patient is in today for a office visit. She complains of dizziness recently. She has tried physical therapy to manage her symptoms but found no relief. When she has an episode of dizziness she walks to manage it. She uses a walker to help assist her walking. She also has diplopia when looking to the side.  Immunization- She has gotten her 3rd Covid-19 vaccine.  Allergy- She complains of nasal drainage and dry coughing. She does not take any allergy medication at this time. Headaches- Her headaches have improved after her other physician increased her dosage for clonidine.  Abdominal pain- She reports her stomach pain has improved while on 20 mg Pepcid 2x daily PO. Knee- She complains of right knee pain. She also complains of feeling like her knee gives out while walking. She takes tylenol to manage the pain and finds mild relief. She has a history of swelling in that knee. Skin-She complains of multiple new red skin lesions throughout her body. She has stopped taking fluconazole every other day PO.   Past Medical History:  Diagnosis Date  . Accelerated hypertension 12/04/2015  . AF (paroxysmal atrial fibrillation) ([place]) 06/12/2020  . Anxiety   . Bilateral impacted cerumen 02/12/2018  . Breast cancer ([place])    right  . 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 Cyst of joint of shoulder 12/20/2017  . Dizziness 11/12/2020  . DLBCL (diffuse large B cell lymphoma) ([place]) 02/29/2016  . [place] of macula of both eyes 02/29/2016  . Dry eyes, bilateral 02/29/2016  . Dyslipidemia, goal LDL below 130 10/26/2016  . Dysphagia 05/25/2015  . Dyspnea 05/25/2015  . Essential hypertension 05/27/2015  . Excessive cerumen  in ear canal, bilateral 04/26/2017  . Frequent PAC (premature atrial contraction) 01/09/2021  . Gastroesophageal reflux disease 06/12/2020  . Glaucoma 06/12/2020  . History of B-cell lymphoma 10/26/2016  . History of breast cancer 06/12/2020  . 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) 08/17/2017   right Formatting of this note might be different from the original. 2013 rt radiation with balloon  . Hyperlipidemia   . Hypertension   . Hyponatremia 05/25/2015   Formatting of this note might be different from the original. Mild and likely 2ry to diuretic use  . Idiopathic peripheral neuropathy 10/26/2016  . Insomnia 06/12/2020  . Knee effusion, right 01/22/2016  . Laryngeal edema 05/25/2015  . Lymphoma ([place]) 05/05/2015  . Microcalcification of left breast on mammogram 09/27/2017  . Murmur, cardiac 07/11/2020  . Osteopenia of left thigh 10/26/2016  . Other visual disturbances 12/04/2015  . PAF (paroxysmal atrial fibrillation) ([place]) 07/11/2020  . PAT (paroxysmal atrial tachycardia) ([place]) 01/09/2021  . Poor balance 06/12/2020  . Primary hypertension 11/12/2020  . Primary open angle glaucoma of left eye, moderate stage 02/29/2016  . Primary osteoarthritis of both knees 01/22/2016  . Primary osteoarthritis of both shoulders 04/12/2017  . Primary osteoarthritis of left knee 01/11/2017  . Pseudophakia of both eyes 02/29/2016  . PVD (posterior vitreous detachment), both eyes 02/29/2016  . Radiation injury 05/27/2015  . Right rotator cuff tear arthropathy 01/03/2018  . Sensorineural hearing loss (SNHL) of both ears 04/26/2017  .  Shortness of breath 02/13/2021  . Varicose veins of left lower extremity 06/11/2020    Past Surgical History:  Procedure Laterality Date  . BLADDER SUSPENSION    . BREAST SURGERY Right 2008   lumpectomy  . LAPAROSCOPIC HYSTERECTOMY    . LYMPHADENECTOMY Right 2016    Family History  Problem Relation Age of Onset  . Heart attack Father   . Lymphoma Sister   . Aneurysm  Brother   . Cancer Sister   . Stroke Sister     Social History   Socioeconomic History  . Marital status: Widowed    Spouse name: Not on file  . Number of children: Not on file  . Years of education: Not on file  . Highest education level: Not on file  Occupational History  . Occupation: Retired  Tobacco Use  . Smoking status: Former Research scientist (life sciences)  . Smokeless tobacco: Never Used  Vaping Use  . Vaping Use: Never used  Substance and Sexual Activity  . Alcohol use: Yes    Alcohol/week: 3.0 standard drinks    Types: 3 Glasses of wine per week    Comment: wine 3 x per week  . Drug use: Never  . Sexual activity: Not Currently  Other Topics Concern  . 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 b complex vitamins capsule Take 1 capsule by mouth daily.    . citalopram (CELEXA) 20 MG tablet Take 1 tablet by mouth once daily 90 tablet 0  . cloNIDine (CATAPRES) 0.1 MG tablet Take 1 tablet (0.1 mg total) by mouth at bedtime. 60 tablet 11  . erythromycin ophthalmic ointment Place 1 application into both eyes at bedtime.    . famotidine (PEPCID) 20 MG tablet Take 1 tablet (20 mg total) by mouth 2 (two) times daily. 60 tablet 2  . fluconazole (DIFLUCAN) 200 MG tablet Take 200 mg by mouth every other day.    . irbesartan (AVAPRO) 75 MG tablet Take 1 tablet  (75 mg total) by mouth daily. 90 tablet 3  . LORazepam (ATIVAN) 1 MG tablet TAKE 1 TABLET BY MOUTH AT BEDTIME 90 tablet 0  . Tafluprost, PF, (ZIOPTAN) 0.0015 % SOLN Place 1 drop into both eyes at bedtime.    . verapamil (CALAN-SR) 120 MG CR tablet Take 1 tablet (120 mg total) by mouth at bedtime. 90 tablet 3   No facility-administered medications prior to visit.    Allergies  Allergen Reactions  . Bimatoprost Other (See Comments)    toxicity  . Ibuprofen Hypertension    Other reaction(s): Other (See Comments) hypertension hypertension   . Levofloxacin Diarrhea  . Penicillins Itching  . Shellfish Allergy Other (See Comments)    Myalgias - "all  statins" Per pt Myalgias - "all statins" Per pt  Myalgias - "all statins" Per pt Myalgias - "all statins" Per pt Myalgias - "all statins" Per pt  . Statins Other (See Comments)    Myalgias - "all statins"  Per pt Myalgias - "all statins"  Per pt  Myalgias - "all statins" Per pt Myalgias - "all statins" Per pt Myalgias - "all statins" Per pt  . Atorvastatin Other (See Comments)    unknown unknown   . Brimonidine Other (See Comments)    Unknown Unknown Unknown   . Dorzolamide Hcl-Timolol Mal Other (See Comments)    unknown unknown   . Lovastatin Other (See Comments)    unknown unknown   . Perindopril Other (See Comments)    unknown unknown     Review of Systems  Eyes: Positive for double vision.  Gastrointestinal: Positive for abdominal pain.  Musculoskeletal: Positive for joint pain (Knee pain).  Skin: Positive for rash.  Neurological: Positive for dizziness and headaches.       Objective:    Physical Exam Constitutional:      Appearance: Normal appearance.  HENT:     Head: Normocephalic and atraumatic.     Right Ear: External ear normal.     Left Ear: External ear normal.  Eyes:     Extraocular Movements: Extraocular movements intact.     Pupils: Pupils are equal, round, and reactive to light.   Cardiovascular:     Rate and Rhythm: Normal rate and regular rhythm.     Pulses: Normal pulses.     Heart sounds: Normal heart sounds. No murmur heard. No gallop.   Pulmonary:     Effort: Pulmonary effort is normal. No respiratory distress.     Breath sounds: Normal breath sounds. No wheezing, rhonchi or rales.  Musculoskeletal:     Comments: Swelling of right knee no erythema  Skin:    General: Skin is warm and dry.     Comments: Several pink ulcerated lesion on upper extremities and one on inner thigh.   Neurological:     Mental Status: She is alert and oriented to person, place, and time.  Psychiatric:        Behavior: Behavior normal.     There were no vitals taken for this visit. Wt Readings from Last 3 Encounters:  03/12/21 166 lb (75.3 kg)  02/13/21 165 lb 1.3 oz (74.9 kg)  01/09/21 167 lb (75.8 kg)    Diabetic Foot Exam - Simple   No data filed    Lab Results  Component Value Date   WBC 4.9 02/13/2021   HGB 13.7 02/13/2021   HCT 39.4 02/13/2021   PLT 137 (L) 02/13/2021   GLUCOSE 114 (H) 02/13/2021   ALT 12 06/11/2020   AST 11 06/11/2020   NA 140 02/13/2021   K 4.5 02/13/2021   CL 102 02/13/2021   CREATININE 0.55 (L) 02/13/2021   BUN 28 02/13/2021   CO2 23 02/13/2021   TSH 4.130 01/09/2021    Lab Results  Component Value Date   TSH 4.130 01/09/2021   Lab Results  Component Value Date   WBC 4.9 02/13/2021   HGB 13.7 02/13/2021   HCT 39.4 02/13/2021   MCV 100 (H) 02/13/2021   PLT 137 (L) 02/13/2021   Lab Results  Component Value Date   NA 140 02/13/2021   K 4.5 02/13/2021   CO2 23 02/13/2021   GLUCOSE 114 (H) 02/13/2021   BUN 28 02/13/2021   CREATININE  0.55 (L) 02/13/2021   BILITOT 0.5 06/11/2020   ALKPHOS 51 06/11/2020   AST 11 06/11/2020   ALT 12 06/11/2020   PROT 7.0 06/11/2020   ALBUMIN 4.3 06/11/2020   CALCIUM 9.6 02/13/2021   EGFR 85 02/13/2021   GFR 105.47 06/11/2020   No results found for: CHOL No results found for:  HDL No results found for: LDLCALC No results found for: TRIG No results found for: CHOLHDL No results found for: HGBA1C     Assessment & Plan:   Problem List Items Addressed This Visit   None      No orders of the defined types were placed in this encounter.   I, Carla Alar NP, personally preformed the services described in this documentation.  All medical record entries made by the scribe were at my direction and in my presence.  I have reviewed the chart and discharge instructions (if applicable) and agree that the record reflects my personal performance and is accurate and complete. 04/10/2021   I,Carla Chandler,acting as a Education administrator for Nance Pear, NP.,have documented all relevant documentation on the behalf of Nance Pear, NP,as directed by  Nance Pear, NP while in the presence of Nance Pear, NP.   Carla [employer]

## 2021-05-11 ENCOUNTER — Other Ambulatory Visit: Payer: Self-pay | Admitting: Family

## 2021-05-19 ENCOUNTER — Other Ambulatory Visit: Payer: Self-pay | Admitting: Family

## 2021-05-21 DIAGNOSIS — H401111 Primary open-angle glaucoma, right eye, mild stage: Secondary | ICD-10-CM | POA: Diagnosis not present

## 2021-05-21 DIAGNOSIS — H35363 Drusen (degenerative) of macula, bilateral: Secondary | ICD-10-CM | POA: Diagnosis not present

## 2021-05-21 DIAGNOSIS — H4912 Fourth [trochlear] nerve palsy, left eye: Secondary | ICD-10-CM | POA: Diagnosis not present

## 2021-05-21 DIAGNOSIS — Z961 Presence of intraocular lens: Secondary | ICD-10-CM | POA: Diagnosis not present

## 2021-05-21 DIAGNOSIS — H16223 Keratoconjunctivitis sicca, not specified as Sjogren's, bilateral: Secondary | ICD-10-CM | POA: Diagnosis not present

## 2021-05-21 DIAGNOSIS — H43813 Vitreous degeneration, bilateral: Secondary | ICD-10-CM | POA: Diagnosis not present

## 2021-05-21 DIAGNOSIS — H401122 Primary open-angle glaucoma, left eye, moderate stage: Secondary | ICD-10-CM | POA: Diagnosis not present

## 2021-06-21 ENCOUNTER — Other Ambulatory Visit: Payer: Self-pay | Admitting: Family

## 2021-06-22 ENCOUNTER — Encounter: Payer: Self-pay | Admitting: Cardiology

## 2021-06-22 ENCOUNTER — Other Ambulatory Visit: Payer: Self-pay | Admitting: Cardiology

## 2021-06-22 ENCOUNTER — Other Ambulatory Visit: Payer: Self-pay

## 2021-06-22 ENCOUNTER — Ambulatory Visit: Payer: PPO | Admitting: Cardiology

## 2021-06-22 VITALS — BP 126/70 | HR 98 | Ht 65.0 in | Wt 166.0 lb

## 2021-06-22 DIAGNOSIS — I471 Supraventricular tachycardia: Secondary | ICD-10-CM | POA: Insufficient documentation

## 2021-06-22 DIAGNOSIS — I10 Essential (primary) hypertension: Secondary | ICD-10-CM

## 2021-06-22 DIAGNOSIS — R5383 Other fatigue: Secondary | ICD-10-CM | POA: Insufficient documentation

## 2021-06-22 DIAGNOSIS — R0602 Shortness of breath: Secondary | ICD-10-CM

## 2021-06-22 DIAGNOSIS — I491 Atrial premature depolarization: Secondary | ICD-10-CM | POA: Diagnosis not present

## 2021-06-22 NOTE — Progress Notes (Signed)
Cardiology Office Note:    Date:  06/22/2021   ID:  Oneita Chandler, DOB 21-Dec-1925, MRN [id number]  PCP:  Carla Alar, NP  Cardiologist:  Carla Salines, DO  Electrophysiologist:  None   Referring MD: Carla Alar, NP   " I am experiencing shortness of breath"   History of Present Illness:    Carla Chandler is a 87 y.o. female with a hx of asthma, paroxysmal atrial fibrillation which was diagnosed over 20 years ago has not had any recurrent episodes and has been back in sinus rhythm, hyperlipidemia, hypertension. Patient is here today for follow-up visit.  She is here today with her daughter.   I last saw the patient on 10/21/2020 and at that time she was experiencing some shortness of breath and dizziness. Based on her clinical exam, I suspected that her shortness breath may have been pulmonary in nature. We got a chest x-ray and blood work. The chest x-ray was normal. Her blood work showed hyperkalemia.    Her last visit was November 12, 2020 at that time she had reported that she was experiencing significant dizziness I stop her hydrochlorothiazide kept the patient on her irbesartan and verapamil.     I saw the patient on January 09, 2021 at that time we discussed her monitor which show frequent PACs while she was on verapamil.  Because of this was stopped verapamil and started the patient on propanolol 10 mg twice a day.  She also was hypotensive so we held her irbesartan.   I saw the patient on 02/13/2021 at that time she had had significant A. fib was unable to do any of her ADLs.  Stopped the propanolol for the patient and we started.  She is not having significant palpitations.  But her blood pressure.  Also experiencing some GI issues and recommended that she see her PCP.   At her visit on March 12, 2021 she still was experiencing elevated blood pressure.  She also was experiencing a lot of stress as she had lost her son and was going to the funeral the next  day.   She is here today for follow-up visit.  Her blood pressure has improved per minute's.  But she tells me she experiencing worsening shortness of breath and significant fatigue.   Past Medical History:  Diagnosis Date   Accelerated hypertension 12/04/2015   AF (paroxysmal atrial fibrillation) ([place]) 06/12/2020   Anxiety    Bilateral impacted cerumen 02/12/2018   Breast cancer ([place])    right   CN VI palsy, left 03/13/2019   Cyst of joint of shoulder 12/20/2017   Dizziness 11/12/2020   DLBCL (diffuse large B cell lymphoma) ([place]) 02/29/2016   [place] of macula of both eyes 02/29/2016   Dry eyes, bilateral 02/29/2016   Dyslipidemia, goal LDL below 130 10/26/2016   Dysphagia 05/25/2015   Dyspnea 05/25/2015   Essential hypertension 05/27/2015   Excessive cerumen in ear canal, bilateral 04/26/2017   Frequent PAC (premature atrial contraction) 01/09/2021   Gastroesophageal reflux disease 06/12/2020   Glaucoma 06/12/2020   History of B-cell lymphoma 10/26/2016   History of breast cancer 06/12/2020   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 2013 rt radiation with balloon   Hyperlipidemia    Hypertension    Hyponatremia 05/25/2015   Formatting of this note might be different from the original. Mild and likely 2ry to diuretic use  Idiopathic peripheral neuropathy 10/26/2016   Insomnia 06/12/2020   Knee effusion, right 01/22/2016   Laryngeal edema 05/25/2015   Lymphoma ([place]) 05/05/2015   Microcalcification of left breast on mammogram 09/27/2017   Murmur, cardiac 07/11/2020   Osteopenia of left thigh 10/26/2016   Other visual disturbances 12/04/2015   PAF (paroxysmal atrial fibrillation) ([place]) 07/11/2020   PAT (paroxysmal atrial tachycardia) ([place]) 01/09/2021   Poor balance 06/12/2020   Primary hypertension 11/12/2020   Primary open angle glaucoma of left eye, moderate stage 02/29/2016   Primary osteoarthritis of  both knees 01/22/2016   Primary osteoarthritis of both shoulders 04/12/2017   Primary osteoarthritis of left knee 01/11/2017   Pseudophakia of both eyes 02/29/2016   PVD (posterior vitreous detachment), both eyes 02/29/2016   Radiation injury 05/27/2015   Right rotator cuff tear arthropathy 01/03/2018   Sensorineural hearing loss (SNHL) of both ears 04/26/2017   Shortness of breath 02/13/2021   Varicose veins of left lower extremity 06/11/2020    Past Surgical History:  Procedure Laterality Date   BLADDER SUSPENSION     BREAST SURGERY Right 2008   lumpectomy   LAPAROSCOPIC HYSTERECTOMY     LYMPHADENECTOMY Right 2016    Current Medications: Current Meds  Medication Sig   b complex vitamins capsule Take 1 capsule by mouth daily.   citalopram (CELEXA) 20 MG tablet Take 1 tablet by mouth once daily   cloNIDine (CATAPRES) 0.1 MG tablet Take 1 tablet (0.1 mg total) by mouth at bedtime.   erythromycin ophthalmic ointment Place 1 application into both eyes at bedtime.   famotidine (PEPCID) 20 MG tablet Take 1 tablet by mouth twice daily   irbesartan (AVAPRO) 75 MG tablet Take 1 tablet (75 mg total) by mouth daily.   LORazepam (ATIVAN) 1 MG tablet TAKE 1 TABLET BY MOUTH AT BEDTIME   Tafluprost, PF, (ZIOPTAN) 0.0015 % SOLN Place 1 drop into both eyes at bedtime.   verapamil (CALAN-SR) 120 MG CR tablet Take 1 tablet (120 mg total) by mouth at bedtime.     Allergies:   Bimatoprost, Levofloxacin, Penicillins, Shellfish allergy, Statins, Atorvastatin, Brimonidine, Dorzolamide hcl-timolol mal, Lovastatin, and Perindopril   Social History   Socioeconomic History   Marital status: Widowed    Spouse name: Not on file   Number of children: Not on file   Years of education: Not on file   Highest education level: Not on file  Occupational History   Occupation: Retired  Tobacco Use   Smoking status: Former   Smokeless tobacco: Never  Scientific laboratory technician Use: Never used  Substance and Sexual Activity    Alcohol use: Yes    Alcohol/week: 3.0 standard drinks    Types: 3 Glasses of wine per week    Comment: wine 3 x per week   Drug use: Never   Sexual activity: Not Currently  Other Topics Concern   Not on file  Social History Narrative   Grew up in [place], moved to [place] in her 20's   Retired Editor, 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  ROS:   Review of Systems  Constitution: Negative for decreased appetite, fever and weight gain.  HENT: Negative for congestion, ear discharge, hoarse voice and sore throat.   Eyes: Negative for discharge, redness, vision loss in right eye and visual halos.  Cardiovascular: Negative for chest pain, dyspnea on exertion, leg swelling, orthopnea and palpitations.  Respiratory: Negative for cough, hemoptysis, shortness of breath and snoring.   Endocrine: Negative for heat intolerance and polyphagia.  Hematologic/Lymphatic: Negative for bleeding problem. Does not bruise/bleed easily.  Skin: Negative for flushing, nail changes, rash and suspicious lesions.  Musculoskeletal: Negative for arthritis, joint pain, muscle cramps, myalgias, neck pain and stiffness.  Gastrointestinal: Negative for abdominal pain, bowel incontinence, diarrhea and excessive appetite.  Genitourinary: Negative for decreased libido, genital sores and incomplete emptying.  Neurological:  Negative for brief paralysis, focal weakness, headaches and loss of balance.  Psychiatric/Behavioral: Negative for altered mental status, depression and suicidal ideas.  Allergic/Immunologic: Negative for HIV exposure and persistent infections.    EKGs/Labs/Other Studies Reviewed:    The following studies were reviewed today:   EKG:  The ekg ordered today demonstrates sinus rhythm, heart rate 90 bpm with changes suggesting old inferior and lateral wall infarction of age-indeterminate-compared to prior EKG no significant change.  Recent Labs: 01/09/2021: TSH 4.130 02/13/2021: BUN 28; Creatinine, Ser 0.55; Hemoglobin 13.7; Magnesium 2.5; Platelets 137; Potassium 4.5; Sodium 140  Recent Lipid Panel No results found for: CHOL, TRIG, HDL, CHOLHDL, VLDL, LDLCALC, LDLDIRECT  Physical Exam:    VS:  BP 126/70 (BP Location: Right Arm, Patient Position: Sitting)   Pulse 98   Ht '5\' 5"'$  (1.651 m)   Wt 166 lb 0.6 oz (75.3 kg)   SpO2 96%   BMI 27.63 kg/m     Wt Readings from Last 3 Encounters:  06/22/21 166 lb 0.6 oz (75.3 kg)  04/10/21 165 lb 12.8 oz (75.2 kg)  03/12/21 166 lb (75.3 kg)     GEN: Well nourished, well developed in no acute distress HEENT: Normal NECK: No JVD; No carotid bruits LYMPHATICS: No lymphadenopathy CARDIAC: S1S2 noted,RRR, no murmurs, rubs, gallops RESPIRATORY:  Clear to auscultation without rales, wheezing or rhonchi  ABDOMEN: Soft, non-tender, non-distended, +bowel sounds, no guarding. EXTREMITIES: No edema, No cyanosis, no clubbing MUSCULOSKELETAL:  No deformity  SKIN: Warm and dry NEUROLOGIC:  Alert and oriented x 3, non-focal PSYCHIATRIC:  Normal affect, good insight  ASSESSMENT:    1. Hypertension, unspecified type   2. PAC (premature atrial contraction)   3. Atrial tachycardia ([place])   4. Shortness of breath   5. Fatigue, unspecified type   6. Other fatigue    PLAN:    Thankfully her blood pressure has improved significantly since the start of  her clonidine at night.  But unfortunately she is experiencing worsening shortness of breath as well as significant fatigue.  I reviewed her prior echo which was done in October 2021.  We will get a repeat echocardiogram to make sure that there is no significant cardiomyopathy.  In addition TSH as well as a vitamin D level will also be done.  We also discussed various possibilities for her fatigue which may include sleep apnea as well as deconditioning  giving her the fact that she is not active may be playing a role.  For now she does not want to pursue any other significant testing including sleep study and any ischemic evaluation.  The patient is in agreement with the above plan. The patient left the office in stable condition.  The patient will follow up in 6 months or sooner if needed.   Medication Adjustments/Labs and Tests Ordered: Current medicines are reviewed at length with the patient today.  Concerns regarding medicines are outlined above.  Orders Placed This Encounter  Procedures   Basic metabolic panel   Magnesium   CBC   TSH   VITAMIN D 25 Hydroxy (Vit-D Deficiency, Fractures)   EKG 12-Lead   ECHOCARDIOGRAM COMPLETE    No orders of the defined types were placed in this encounter.   Patient Instructions  Medication Instructions:  Your physician recommends that you continue on your current medications as directed. Please refer to the Current Medication list given to you today.  *If you need a refill on your cardiac medications before your next appointment, please call your pharmacy*   Lab Work: Your physician recommends that you return for lab work in: Today for Basic Metabolic Panel, Magnesium, CBC, TSH, Vit D  If you have labs (blood work) drawn today and your tests are completely normal, you will receive your results only by: MyChart Message (if you have MyChart) OR A paper copy in the mail If you have any lab test that is abnormal or we need to change your  treatment, we will call you to review the results.   Testing/Procedures: Your physician has requested that you have an echocardiogram. Echocardiography is a painless test that uses sound waves to create images of your heart. It provides your doctor with information about the size and shape of your heart and how well your heart's chambers and valves are working. This procedure takes approximately one hour. There are no restrictions for this procedure.    Follow-Up: At [facility], you and your health needs are our priority.  As part of our continuing mission to provide you with exceptional heart care, we have created designated Provider Care Teams.  These Care Teams include your primary Cardiologist (physician) and Advanced Practice Providers (APPs -  Physician Assistants and Nurse Practitioners) who all work together to provide you with the care you need, when you need it.  We recommend signing up for the patient portal called "MyChart".  Sign up information is provided on this After Visit Summary.  MyChart is used to connect with patients for Virtual Visits (Telemedicine).  Patients are able to view lab/test results, encounter notes, upcoming appointments, etc.  Non-urgent messages can be sent to your provider as well.   To learn more about what you can do with MyChart, go to [email or web address].    Your next appointment:   6 month(s)  The format for your next appointment:   In Person  Provider:   Dr. Debbe Mounts Masen Luallen @ [facility]   Other Instructions     Adopting a Healthy Lifestyle.  Know what a healthy weight is for you (roughly BMI <25) and aim to maintain this   Aim for 7+ servings of fruits and vegetables daily   65-80+ fluid ounces of water or unsweet tea for healthy kidneys   Limit to max 1 drink of alcohol per day; avoid smoking/tobacco   Limit animal fats in diet for cholesterol and heart health - choose grass fed whenever available  Avoid highly processed foods,  and foods high in saturated/trans fats   Aim for low stress - take time to unwind and care for your mental health   Aim for 150 min of moderate intensity exercise weekly for heart health, and weights twice weekly for bone health   Aim for 7-9 hours of sleep daily   When it comes to diets, agreement about the perfect plan isnt easy to find, even among the experts. Experts at the [employer] developed an idea known as the Healthy Eating Plate. Just imagine a plate divided into logical, healthy portions.   The emphasis is on diet quality:   Load up on vegetables and fruits - one-half of your plate: Aim for color and variety, and remember that potatoes dont count.   Go for whole grains - one-quarter of your plate: Whole wheat, barley, wheat berries, quinoa, oats, brown rice, and foods made with them. If you want pasta, go with whole wheat pasta.   Protein power - one-quarter of your plate: Fish, chicken, beans, and nuts are all healthy, versatile protein sources. Limit red meat.   The diet, however, does go beyond the plate, offering a few other suggestions.   Use healthy plant oils, such as olive, canola, soy, corn, sunflower and peanut. Check the labels, and avoid partially hydrogenated oil, which have unhealthy trans fats.   If youre thirsty, drink water. Coffee and tea are good in moderation, but skip sugary drinks and limit milk and dairy products to one or two daily servings.   The type of carbohydrate in the diet is more important than the amount. Some sources of carbohydrates, such as vegetables, fruits, whole grains, and beans-are healthier than others.   Finally, stay active  Signed, Carla Salines, DO  06/22/2021 11:48 AM    [place]

## 2021-06-22 NOTE — Patient Instructions (Signed)
Medication Instructions:  Your physician recommends that you continue on your current medications as directed. Please refer to the Current Medication list given to you today.  *If you need a refill on your cardiac medications before your next appointment, please call your pharmacy*   Lab Work: Your physician recommends that you return for lab work in: Today for Basic Metabolic Panel, Magnesium, CBC, TSH, Vit D  If you have labs (blood work) drawn today and your tests are completely normal, you will receive your results only by: MyChart Message (if you have MyChart) OR A paper copy in the mail If you have any lab test that is abnormal or we need to change your treatment, we will call you to review the results.   Testing/Procedures: Your physician has requested that you have an echocardiogram. Echocardiography is a painless test that uses sound waves to create images of your heart. It provides your doctor with information about the size and shape of your heart and how well your heart's chambers and valves are working. This procedure takes approximately one hour. There are no restrictions for this procedure.    Follow-Up: At [facility], you and your health needs are our priority.  As part of our continuing mission to provide you with exceptional heart care, we have created designated Provider Care Teams.  These Care Teams include your primary Cardiologist (physician) and Advanced Practice Providers (APPs -  Physician Assistants and Nurse Practitioners) who all work together to provide you with the care you need, when you need it.  We recommend signing up for the patient portal called "MyChart".  Sign up information is provided on this After Visit Summary.  MyChart is used to connect with patients for Virtual Visits (Telemedicine).  Patients are able to view lab/test results, encounter notes, upcoming appointments, etc.  Non-urgent messages can be sent to your provider as well.   To learn  more about what you can do with MyChart, go to [email or web address].    Your next appointment:   6 month(s)  The format for your next appointment:   In Person  Provider:   Dr. Debbe Mounts Tobb @ [facility]   Other Instructions

## 2021-06-23 LAB — BASIC METABOLIC PANEL
BUN/Creatinine Ratio: 44 — ABNORMAL HIGH (ref 12–28)
BUN: 24 mg/dL (ref 10–36)
CO2: 24 mmol/L (ref 20–29)
Calcium: 9.8 mg/dL (ref 8.7–10.3)
Chloride: 100 mmol/L (ref 96–106)
Creatinine, Ser: 0.54 mg/dL — ABNORMAL LOW (ref 0.57–1.00)
Glucose: 86 mg/dL (ref 65–99)
Potassium: 4.6 mmol/L (ref 3.5–5.2)
Sodium: 136 mmol/L (ref 134–144)
eGFR: 86 mL/min/{1.73_m2} (ref >59)

## 2021-06-23 LAB — CBC
Hematocrit: 39.2 % (ref 34.0–46.6)
Hemoglobin: 13.4 g/dL (ref 11.1–15.9)
MCH: 33.9 pg — ABNORMAL HIGH (ref 26.6–33.0)
MCHC: 34.2 g/dL (ref 31.5–35.7)
MCV: 99 fL — ABNORMAL HIGH (ref 79–97)
Platelets: 151 10*3/uL (ref 150–450)
RBC: 3.95 x10E6/uL (ref 3.77–5.28)
RDW: 12.4 % (ref 11.7–15.4)
WBC: 4.9 10*3/uL (ref 3.4–10.8)

## 2021-06-23 LAB — VITAMIN D 25 HYDROXY (VIT D DEFICIENCY, FRACTURES): Vit D, 25-Hydroxy: 29.9 ng/mL — ABNORMAL LOW (ref 30.0–100.0)

## 2021-06-23 LAB — TSH: TSH: 3.95 u[IU]/mL (ref 0.450–4.500)

## 2021-06-23 LAB — MAGNESIUM: Magnesium: 2.3 mg/dL (ref 1.6–2.3)

## 2021-06-24 ENCOUNTER — Telehealth: Payer: Self-pay

## 2021-06-24 MED ORDER — VITAMIN D (ERGOCALCIFEROL) 1.25 MG (50000 UNIT) PO CAPS: 50000.0000 [IU] | ORAL_CAPSULE | ORAL | 2 refills | Status: DC

## 2021-06-24 NOTE — Telephone Encounter (Signed)
Patient notified of results and recommendations and agrees with plan. Rx sent

## 2021-06-24 NOTE — Telephone Encounter (Signed)
-----   Message from Berniece Salines, DO sent at 06/24/2021  2:20 PM EDT ----- Vitamin D deficiency I would like to replete that with 50,000 unit once a week.  All the labs stable.

## 2021-07-10 ENCOUNTER — Ambulatory Visit (HOSPITAL_COMMUNITY): Payer: PPO | Attending: Internal Medicine

## 2021-07-10 ENCOUNTER — Other Ambulatory Visit: Payer: Self-pay

## 2021-07-10 DIAGNOSIS — R0602 Shortness of breath: Secondary | ICD-10-CM | POA: Insufficient documentation

## 2021-07-10 LAB — ECHOCARDIOGRAM COMPLETE
Area-P 1/2: 2.74 cm2
S' Lateral: 2 cm

## 2021-08-28 DIAGNOSIS — H5211 Myopia, right eye: Secondary | ICD-10-CM | POA: Diagnosis not present

## 2021-08-28 DIAGNOSIS — H5202 Hypermetropia, left eye: Secondary | ICD-10-CM | POA: Diagnosis not present

## 2021-08-28 DIAGNOSIS — H52223 Regular astigmatism, bilateral: Secondary | ICD-10-CM | POA: Diagnosis not present

## 2021-08-28 DIAGNOSIS — H16223 Keratoconjunctivitis sicca, not specified as Sjogren's, bilateral: Secondary | ICD-10-CM | POA: Diagnosis not present

## 2021-08-28 DIAGNOSIS — H04123 Dry eye syndrome of bilateral lacrimal glands: Secondary | ICD-10-CM | POA: Diagnosis not present

## 2021-08-28 DIAGNOSIS — Z961 Presence of intraocular lens: Secondary | ICD-10-CM | POA: Diagnosis not present

## 2021-08-28 DIAGNOSIS — H401111 Primary open-angle glaucoma, right eye, mild stage: Secondary | ICD-10-CM | POA: Diagnosis not present

## 2021-08-28 DIAGNOSIS — H4912 Fourth [trochlear] nerve palsy, left eye: Secondary | ICD-10-CM | POA: Diagnosis not present

## 2021-08-28 DIAGNOSIS — H5203 Hypermetropia, bilateral: Secondary | ICD-10-CM | POA: Diagnosis not present

## 2021-08-28 DIAGNOSIS — H401122 Primary open-angle glaucoma, left eye, moderate stage: Secondary | ICD-10-CM | POA: Diagnosis not present

## 2021-09-02 DIAGNOSIS — Z111 Encounter for screening for respiratory tuberculosis: Secondary | ICD-10-CM | POA: Diagnosis not present

## 2021-09-02 DIAGNOSIS — Z79899 Other long term (current) drug therapy: Secondary | ICD-10-CM | POA: Diagnosis not present

## 2021-09-02 DIAGNOSIS — L400 Psoriasis vulgaris: Secondary | ICD-10-CM | POA: Diagnosis not present

## 2021-09-02 DIAGNOSIS — R531 Weakness: Secondary | ICD-10-CM | POA: Diagnosis not present

## 2021-09-11 DIAGNOSIS — R7611 Nonspecific reaction to tuberculin skin test without active tuberculosis: Secondary | ICD-10-CM | POA: Diagnosis not present

## 2021-09-11 DIAGNOSIS — R7612 Nonspecific reaction to cell mediated immunity measurement of gamma interferon antigen response without active tuberculosis: Secondary | ICD-10-CM | POA: Diagnosis not present

## 2021-09-11 DIAGNOSIS — R918 Other nonspecific abnormal finding of lung field: Secondary | ICD-10-CM | POA: Diagnosis not present

## 2021-09-11 DIAGNOSIS — J439 Emphysema, unspecified: Secondary | ICD-10-CM | POA: Diagnosis not present

## 2021-09-16 ENCOUNTER — Other Ambulatory Visit: Payer: Self-pay | Admitting: Family

## 2021-09-17 NOTE — Telephone Encounter (Signed)
Requesting: lorazepam 1mg   Contract: 06/11/2020 UDS: 06/11/20 Last Visit: 04/10/2021 Next Visit: 10/30/2021 Last Refill: 06/22/2021 #90 and 0RF  Please Advise

## 2021-09-19 ENCOUNTER — Other Ambulatory Visit: Payer: Self-pay | Admitting: Cardiology

## 2021-10-07 ENCOUNTER — Telehealth: Payer: Self-pay | Admitting: Cardiology

## 2021-10-07 NOTE — Telephone Encounter (Signed)
Pt c/o medication issue:  1. Name of Medication: Vitamin D, Ergocalciferol, (DRISDOL) 1.25 MG (50000 UNIT) CAPS capsule  2. How are you currently taking this medication (dosage and times per day)? As directed   3. Are you having a reaction (difficulty breathing--STAT)?   4. What is your medication issue? Patient is almost out of her last refill. She wanted to know if Dr. Harriet Masson still wants her to take the medicine or not

## 2021-10-07 NOTE — Telephone Encounter (Signed)
Tobb, Kardie, DO  You; Newby, Jasmine M, RN 47 minutes ago (1:10 PM)   She completed her 12-week course of this medicine we do not need to send any refills.      Relayed message to patient. She voiced understanding

## 2021-10-07 NOTE — Telephone Encounter (Signed)
Vitamin D 50,000 IU once weekly was prescribed based on 06/22/21 labs Lab work has not been repeated since  Will send to Dr. Harriet Masson to review and advise on refill/if labs are needed

## 2021-10-23 ENCOUNTER — Ambulatory Visit: Payer: PPO | Admitting: Family

## 2021-10-23 DIAGNOSIS — H35363 Drusen (degenerative) of macula, bilateral: Secondary | ICD-10-CM | POA: Diagnosis not present

## 2021-10-23 DIAGNOSIS — H401111 Primary open-angle glaucoma, right eye, mild stage: Secondary | ICD-10-CM | POA: Diagnosis not present

## 2021-10-23 DIAGNOSIS — H43813 Vitreous degeneration, bilateral: Secondary | ICD-10-CM | POA: Diagnosis not present

## 2021-10-23 DIAGNOSIS — Z961 Presence of intraocular lens: Secondary | ICD-10-CM | POA: Diagnosis not present

## 2021-10-23 DIAGNOSIS — H04123 Dry eye syndrome of bilateral lacrimal glands: Secondary | ICD-10-CM | POA: Diagnosis not present

## 2021-10-23 DIAGNOSIS — H401122 Primary open-angle glaucoma, left eye, moderate stage: Secondary | ICD-10-CM | POA: Diagnosis not present

## 2021-10-30 ENCOUNTER — Ambulatory Visit: Payer: PPO | Admitting: Family

## 2021-11-06 ENCOUNTER — Ambulatory Visit (INDEPENDENT_AMBULATORY_CARE_PROVIDER_SITE_OTHER): Payer: PPO | Admitting: Family

## 2021-11-06 ENCOUNTER — Ambulatory Visit: Payer: PPO | Attending: Internal Medicine

## 2021-11-06 ENCOUNTER — Other Ambulatory Visit (HOSPITAL_BASED_OUTPATIENT_CLINIC_OR_DEPARTMENT_OTHER): Payer: Self-pay

## 2021-11-06 VITALS — BP 126/68 | HR 97 | Temp 98.6°F | Resp 16 | Ht 65.0 in | Wt 168.0 lb

## 2021-11-06 DIAGNOSIS — Y842 Radiological procedure and radiotherapy as the cause of abnormal reaction of the patient, or of later complication, without mention of misadventure at the time of the procedure: Secondary | ICD-10-CM | POA: Diagnosis not present

## 2021-11-06 DIAGNOSIS — G620 Drug-induced polyneuropathy: Secondary | ICD-10-CM | POA: Diagnosis not present

## 2021-11-06 DIAGNOSIS — Z79899 Other long term (current) drug therapy: Secondary | ICD-10-CM | POA: Diagnosis not present

## 2021-11-06 DIAGNOSIS — I10 Essential (primary) hypertension: Secondary | ICD-10-CM | POA: Diagnosis not present

## 2021-11-06 DIAGNOSIS — E348 Other specified endocrine disorders: Secondary | ICD-10-CM

## 2021-11-06 DIAGNOSIS — Z23 Encounter for immunization: Secondary | ICD-10-CM

## 2021-11-06 DIAGNOSIS — F419 Anxiety disorder, unspecified: Secondary | ICD-10-CM | POA: Diagnosis not present

## 2021-11-06 DIAGNOSIS — E559 Vitamin D deficiency, unspecified: Secondary | ICD-10-CM

## 2021-11-06 DIAGNOSIS — K219 Gastro-esophageal reflux disease without esophagitis: Secondary | ICD-10-CM

## 2021-11-06 DIAGNOSIS — G4700 Insomnia, unspecified: Secondary | ICD-10-CM | POA: Diagnosis not present

## 2021-11-06 LAB — BASIC METABOLIC PANEL
BUN: 31 mg/dL — ABNORMAL HIGH (ref 6–23)
CO2: 31 mEq/L (ref 19–32)
Calcium: 9.9 mg/dL (ref 8.4–10.5)
Chloride: 100 mEq/L (ref 96–112)
Creatinine, Ser: 0.63 mg/dL (ref 0.40–1.20)
GFR: 76.21 mL/min (ref >60.00)
Glucose, Bld: 84 mg/dL (ref 70–99)
Potassium: 4.8 mEq/L (ref 3.5–5.1)
Sodium: 137 mEq/L (ref 135–145)

## 2021-11-06 LAB — VITAMIN D 25 HYDROXY (VIT D DEFICIENCY, FRACTURES): VITD: 32.66 ng/mL (ref 30.00–100.00)

## 2021-11-06 MED ORDER — PFIZER COVID-19 VAC BIVALENT 30 MCG/0.3ML IM SUSP
INTRAMUSCULAR | 0 refills | Status: DC
  Filled 2021-11-06: qty 0.3, 1d supply, fill #0

## 2021-11-06 MED ORDER — OMEPRAZOLE 40 MG PO CPDR: 40.0000 mg | DELAYED_RELEASE_CAPSULE | Freq: Every day | ORAL | 1 refills | Status: DC

## 2021-11-06 NOTE — Assessment & Plan Note (Signed)
BP Readings from Last 3 Encounters:  11/06/21 126/68  06/22/21 126/70  04/10/21 115/65   Stable on current meds. Continue HS clonidine, iresartan and verapamil.

## 2021-11-06 NOTE — Progress Notes (Signed)
° °  Covid-19 Vaccination Clinic  Name:  Carla Chandler    MRN: [id number] DOB: 09-21-1926  11/06/2021  Carla Chandler was observed post Covid-19 immunization for 15 minutes without incident. She was provided with Vaccine Information Sheet and instruction to access the V-Safe system.   Carla Chandler was instructed to call 911 with any severe reactions post vaccine: Difficulty breathing  Swelling of face and throat  A fast heartbeat  A bad rash all over body  Dizziness and weakness   Immunizations Administered     Name Date Dose VIS Date Route   Pfizer Covid-19 Vaccine Bivalent Booster 11/06/2021 10:13 AM 0.3 mL 07/22/2021 Intramuscular   Manufacturer: [place]   Lot: [id number]   [place]: [phone]

## 2021-11-06 NOTE — Assessment & Plan Note (Signed)
Stable on citalopram and prn lorazepam.

## 2021-11-06 NOTE — Patient Instructions (Addendum)
Stop taking 20 mg Pepcid.  Begin taking Prilosec for acid reflux.  Begin taking 1000 mg tylenol in the morning and 1000 mg tylenol at night to help with knee pain.  Continue taking other medications as directed.  Schedule a bone density exam at your earliest convenience.

## 2021-11-06 NOTE — Progress Notes (Addendum)
Subjective:   By signing my name below, I, Carla Chandler, attest that this documentation has been prepared under the direction and in the presence of Carla Alar, NP, 11/06/2021   Patient ID: Carla Chandler, female    DOB: 1926-06-26, 87 y.o.   MRN: [id number]  Chief Complaint  Patient presents with   Hypertension    Here for 6 Months Follow Up    HPI Patient is in today for an office visit. Her daughter is present in the office with her today.   Reflux: She reports that she is struggling with acid reflux and heartburn at this time. She notes that when she had chemotherapy for her cancer they damaged her upper digestive tract which has caused he issues with eating ever since. She mentions she gets choked up every now and then and struggles to eat certain foods such as bread and [place] due to the dryness of the food item. She is currently compliant with taking 20 mg Pepcid to help with this but denies any improvement in her symptoms with its use. Psoriasis: She is experiencing psoriasis at this time. She is working with another provider to manage this.   Anxiety: She notes that her anxiety is improved with the use of 1 mg lorazepam. She is needing to update her controlled substance contract in the office today.  Blood pressure: Her blood pressure is within good range today in the office. She is compliant in taking 120 mg verapamil and 75 mg irbesartan.  Dexa: She reports that she had a dexa scan performed in the past and that she had no concerns with her bones. An updated scan is recommended at this time. She denies any fractures or falls. Immunizations: She has had three Covid-19 vaccines at this time. She needs an updated tetanus shot at this time but is not interested in getting it.    Health Maintenance Due  Topic Date Due   TETANUS/TDAP  Never done   Zoster Vaccines- Shingrix (1 of 2) Never done   DEXA SCAN  Never done   COVID-19 Vaccine (4 - Booster for Pfizer  series) 11/21/2020    Past Medical History:  Diagnosis Date   Accelerated hypertension 12/04/2015   AF (paroxysmal atrial fibrillation) ([place]) 06/12/2020   Anxiety    Bilateral impacted cerumen 02/12/2018   Breast cancer ([place])    right   CN VI palsy, left 03/13/2019   Cyst of joint of shoulder 12/20/2017   Dizziness 11/12/2020   DLBCL (diffuse large B cell lymphoma) ([place]) 02/29/2016   [place] of macula of both eyes 02/29/2016   Dry eyes, bilateral 02/29/2016   Dyslipidemia, goal LDL below 130 10/26/2016   Dysphagia 05/25/2015   Dyspnea 05/25/2015   Essential hypertension 05/27/2015   Excessive cerumen in ear canal, bilateral 04/26/2017   Frequent PAC (premature atrial contraction) 01/09/2021   Gastroesophageal reflux disease 06/12/2020   Glaucoma 06/12/2020   History of B-cell lymphoma 10/26/2016   History of breast cancer 06/12/2020   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 2013 rt radiation with balloon   Hyperlipidemia    Hypertension    Hyponatremia 05/25/2015   Formatting of this note might be different from the original. Mild and likely 2ry to diuretic use   Idiopathic peripheral neuropathy 10/26/2016   Insomnia 06/12/2020   Knee effusion, right 01/22/2016   Laryngeal edema 05/25/2015   Lymphoma ([place]) 05/05/2015  Microcalcification of left breast on mammogram 09/27/2017   Murmur, cardiac 07/11/2020   Osteopenia of left thigh 10/26/2016   Other visual disturbances 12/04/2015   PAF (paroxysmal atrial fibrillation) ([facility]) 07/11/2020   PAT (paroxysmal atrial tachycardia) ([place]) 01/09/2021   Poor balance 06/12/2020   Primary hypertension 11/12/2020   Primary open angle glaucoma of left eye, moderate stage 02/29/2016   Primary osteoarthritis of both knees 01/22/2016   Primary osteoarthritis of both shoulders 04/12/2017   Primary osteoarthritis of left knee 01/11/2017   Pseudophakia of both eyes 02/29/2016   PVD  (posterior vitreous detachment), both eyes 02/29/2016   Radiation injury 05/27/2015   Right rotator cuff tear arthropathy 01/03/2018   Sensorineural hearing loss (SNHL) of both ears 04/26/2017   Shortness of breath 02/13/2021   Varicose veins of left lower extremity 06/11/2020    Past Surgical History:  Procedure Laterality Date   BLADDER SUSPENSION     BREAST SURGERY Right 2008   lumpectomy   LAPAROSCOPIC HYSTERECTOMY     LYMPHADENECTOMY Right 2016    Family History  Problem Relation Age of Onset   Heart attack Father    Lymphoma Sister    Aneurysm Brother    Cancer Sister    Stroke Sister     Social History   Socioeconomic History   Marital status: Widowed    Spouse name: Not on file   Number of children: Not on file   Years of education: Not on file   Highest education level: Not on file  Occupational History   Occupation: Retired  Tobacco Use   Smoking status: Former   Smokeless tobacco: Never  Scientific laboratory technician Use: Never used  Substance and Sexual Activity   Alcohol use: Yes    Alcohol/week: 3.0 standard drinks    Types: 3 Glasses of wine per week    Comment: wine 3 x per week   Drug use: Never   Sexual activity: Not Currently  Other Topics Concern   Not on file  Social History Narrative   Grew up in [place], moved to [place] in her 20's   Retired Editor, 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     citalopram (CELEXA) 20 MG tablet Take 1 tablet by mouth once daily 90 tablet 1   cloNIDine (CATAPRES) 0.1 MG tablet Take 1 tablet (0.1 mg total) by mouth at bedtime. 60 tablet 11   erythromycin ophthalmic ointment Place 1 application into both eyes at bedtime.     famotidine (PEPCID) 20 MG tablet Take 1 tablet by mouth twice daily 180 tablet 0   irbesartan (AVAPRO) 75 MG tablet Take 1 tablet (75 mg total) by mouth daily. 90 tablet 3   LORazepam (ATIVAN) 1 MG tablet TAKE 1 TABLET BY MOUTH AT  BEDTIME 90 tablet 0   Tafluprost, PF, (ZIOPTAN) 0.0015 % SOLN Place 1 drop into both eyes at bedtime.     verapamil (CALAN-SR) 120 MG CR tablet Take 1 tablet by mouth once daily 90 tablet 0   Vitamin D, Ergocalciferol, (DRISDOL) 1.25 MG (50000 UNIT) CAPS capsule Take 1 capsule (50,000 Units total) by mouth every 7 (seven) days. (Patient not taking: Reported on 11/06/2021) 5 capsule 2   No facility-administered medications prior to visit.    Allergies  Allergen Reactions   Bimatoprost Other (See Comments)    toxicity   Levofloxacin Diarrhea   Penicillins Itching   Shellfish Allergy Other (See Comments)   Statins Other (See Comments)    Myalgias - "all statins"  Per pt   Atorvastatin Other (See Comments)   Brimonidine Other (See Comments)   Dorzolamide Hcl-Timolol Mal Other (See Comments)   Lovastatin Other (See Comments)   Perindopril Other (See Comments)    Review of Systems  Gastrointestinal:  Positive for heartburn.  Musculoskeletal:  Positive for joint pain (in knees). Negative for falls.  Skin:        (+) psoriasis   Psychiatric/Behavioral:  Positive for substance abuse.       Objective:    Physical Exam Constitutional:      General: She is not in acute distress.    Appearance: Normal appearance. She is not ill-appearing.  HENT:     Head: Normocephalic and atraumatic.     Right Ear: External ear normal.     Left Ear: External ear normal.  Eyes:      Extraocular Movements: Extraocular movements intact.     Pupils: Pupils are equal, round, and reactive to light.  Cardiovascular:     Rate and Rhythm: Normal rate and regular rhythm.     Heart sounds: Normal heart sounds. No murmur heard.   No gallop.  Pulmonary:     Effort: Pulmonary effort is normal. No respiratory distress.     Breath sounds: Normal breath sounds. No wheezing or rales.  Skin:    General: Skin is warm and dry.  Neurological:     Mental Status: She is alert and oriented to person, place, and time.  Psychiatric:        Behavior: Behavior normal.        Judgment: Judgment normal.    BP 126/68 (BP Location: Right Arm, Patient Position: Sitting, Cuff Size: Small)    Pulse 97    Temp 98.6 F (37 C) (Oral)    Resp 16    Ht 5\' 5"  (1.651 m)    Wt 168 lb (76.2 kg)    SpO2 95%    BMI 27.96 kg/m  Wt Readings from Last 3 Encounters:  11/06/21 168 lb (76.2 kg)  06/22/21 166 lb 0.6 oz (75.3 kg)  04/10/21 165 lb 12.8 oz (75.2 kg)       Assessment & Plan:   Problem List Items Addressed This Visit       Unprioritized   Gastroesophageal reflux disease    Uncontrolled. Dc pepcid. Trial of omeprazole. Discussed gerd diet.       Relevant Medications   omeprazole (PRILOSEC) 40 MG capsule   Essential hypertension    BP Readings from Last 3 Encounters:  11/06/21 126/68  06/22/21 126/70  04/10/21 115/65  Stable on current meds. Continue HS clonidine, iresartan and verapamil.       Relevant Orders   Basic metabolic panel   Anxiety    Stable on  citalopram and prn lorazepam.       Relevant Orders   DRUG MONITORING, PANEL 8 WITH CONFIRMATION, URINE   Other Visit Diagnoses     Vitamin D deficiency    -  Primary   Relevant Orders   Vitamin D (25 hydroxy)   Estradiol deficiency       Relevant Orders   DG Bone Density      Meds ordered this encounter  Medications   omeprazole (PRILOSEC) 40 MG capsule    Sig: Take 1 capsule (40 mg total) by mouth daily.     Dispense:  90 capsule    Refill:  1    Order Specific Question:   Supervising Provider    Answer:   Penni Homans A [[id number]]    I, Carla Alar, NP, personally preformed the services described in this documentation.  All medical record entries made by the scribe were at my direction and in my presence.  I have reviewed the chart and discharge instructions (if applicable) and agree that the record reflects my personal performance and is accurate and complete. 11/06/2021  I,Carla Chandler,acting as a Education administrator for Nance Pear, NP.,have documented all relevant documentation on the behalf of Nance Pear, NP,as directed by  Nance Pear, NP while in the presence of Nance Pear, NP.  Nance Pear, NP

## 2021-11-06 NOTE — Assessment & Plan Note (Signed)
Uncontrolled. Dc pepcid. Trial of omeprazole. Discussed gerd diet.

## 2021-11-08 ENCOUNTER — Other Ambulatory Visit: Payer: Self-pay | Admitting: Family

## 2021-11-09 LAB — DRUG MONITORING, PANEL 8 WITH CONFIRMATION, URINE
6 Acetylmorphine: NEGATIVE ng/mL (ref <10)
Alcohol Metabolites: POSITIVE ng/mL — AB (ref <500)
Alphahydroxyalprazolam: NEGATIVE ng/mL (ref <25)
Alphahydroxymidazolam: NEGATIVE ng/mL (ref <50)
Alphahydroxytriazolam: NEGATIVE ng/mL (ref <50)
Aminoclonazepam: NEGATIVE ng/mL (ref <25)
Amphetamines: NEGATIVE ng/mL (ref <500)
Benzodiazepines: POSITIVE ng/mL — AB (ref <100)
Buprenorphine, Urine: NEGATIVE ng/mL (ref <5)
Cocaine Metabolite: NEGATIVE ng/mL (ref <150)
Creatinine: 127.1 mg/dL (ref >=20.0)
Ethyl Glucuronide (ETG): 5827 ng/mL — ABNORMAL HIGH (ref <500)
Ethyl Sulfate (ETS): 829 ng/mL — ABNORMAL HIGH (ref <100)
Hydroxyethylflurazepam: NEGATIVE ng/mL (ref <50)
Lorazepam: 1492 ng/mL — ABNORMAL HIGH (ref <50)
MDMA: NEGATIVE ng/mL (ref <500)
Marijuana Metabolite: NEGATIVE ng/mL (ref <20)
Nordiazepam: NEGATIVE ng/mL (ref <50)
Opiates: NEGATIVE ng/mL (ref <100)
Oxazepam: NEGATIVE ng/mL (ref <50)
Oxidant: NEGATIVE ug/mL (ref <200)
Oxycodone: NEGATIVE ng/mL (ref <100)
Temazepam: NEGATIVE ng/mL (ref <50)
pH: 5.4 (ref 4.5–9.0)

## 2021-11-09 LAB — DM TEMPLATE

## 2021-11-11 ENCOUNTER — Telehealth (HOSPITAL_BASED_OUTPATIENT_CLINIC_OR_DEPARTMENT_OTHER): Payer: Self-pay

## 2021-12-14 ENCOUNTER — Other Ambulatory Visit: Payer: Self-pay | Admitting: Family

## 2021-12-14 NOTE — Telephone Encounter (Signed)
Requesting: lorazepam Contract: 11/06/21 UDS: 11/06/21 Last Visit: 11/06/21 Next Visit: 05/07/22 Last Refill: 09/17/21  Please Advise

## 2021-12-23 ENCOUNTER — Other Ambulatory Visit: Payer: Self-pay | Admitting: Cardiology

## 2021-12-31 ENCOUNTER — Other Ambulatory Visit: Payer: Self-pay

## 2021-12-31 ENCOUNTER — Ambulatory Visit: Payer: PPO | Admitting: Cardiology

## 2021-12-31 ENCOUNTER — Encounter: Payer: Self-pay | Admitting: Cardiology

## 2021-12-31 VITALS — BP 146/88 | HR 104 | Ht 65.0 in | Wt 166.8 lb

## 2021-12-31 DIAGNOSIS — R5383 Other fatigue: Secondary | ICD-10-CM | POA: Diagnosis not present

## 2021-12-31 DIAGNOSIS — I471 Supraventricular tachycardia: Secondary | ICD-10-CM | POA: Diagnosis not present

## 2021-12-31 DIAGNOSIS — E785 Hyperlipidemia, unspecified: Secondary | ICD-10-CM

## 2021-12-31 DIAGNOSIS — I10 Essential (primary) hypertension: Secondary | ICD-10-CM

## 2021-12-31 DIAGNOSIS — I491 Atrial premature depolarization: Secondary | ICD-10-CM

## 2021-12-31 DIAGNOSIS — I4719 Other supraventricular tachycardia: Secondary | ICD-10-CM

## 2021-12-31 MED ORDER — VERAPAMIL HCL ER 180 MG PO TBCR: 180.0000 mg | EXTENDED_RELEASE_TABLET | Freq: Every day | ORAL | 3 refills | Status: DC

## 2021-12-31 NOTE — Patient Instructions (Signed)
Medication Instructions:  Your physician has recommended you make the following change in your medication: \ INCREASE: Verapamil 180 mg once daily *If you need a refill on your cardiac medications before your next appointment, please call your pharmacy*   Lab Work: None If you have labs (blood work) drawn today and your tests are completely normal, you will receive your results only by: [place] (if you have MyChart) OR A paper copy in the mail If you have any lab test that is abnormal or we need to change your treatment, we will call you to review the results.   Testing/Procedures: None   Follow-Up: At [facility], you and your health needs are our priority.  As part of our continuing mission to provide you with exceptional heart care, we have created designated Provider Care Teams.  These Care Teams include your primary Cardiologist (physician) and Advanced Practice Providers (APPs -  Physician Assistants and Nurse Practitioners) who all work together to provide you with the care you need, when you need it.  We recommend signing up for the patient portal called "MyChart".  Sign up information is provided on this After Visit Summary.  MyChart is used to connect with patients for Virtual Visits (Telemedicine).  Patients are able to view lab/test results, encounter notes, upcoming appointments, etc.  Non-urgent messages can be sent to your provider as well.   To learn more about what you can do with MyChart, go to [email or web address].    Your next appointment:   6 month(s)  The format for your next appointment:   In Person  Provider:   Berniece Salines, DO     Other Instructions

## 2021-12-31 NOTE — Progress Notes (Signed)
Cardiology Office Note:    Date:  12/31/2021   ID:  Carla Chandler, DOB 11-Aug-1926, MRN [id number]  PCP:  [name], NP  Cardiologist:  Berniece Salines, DO  Electrophysiologist:  None   Referring MD: [name], NP   " I am having increasing palpitations   History of Present Illness:    Carla Chandler is a 88 y.o. female with a hx of  asthma, paroxysmal atrial fibrillation which was diagnosed over 20 years ago has not had any recurrent episodes and has been back in sinus rhythm, hyperlipidemia, hypertension. Patient is here today for follow-up visit.  She is here today with her daughter.   I last saw the patient on 10/21/2020 and at that time she was experiencing some shortness of breath and dizziness. Based on her clinical exam, I suspected that her shortness breath may have been pulmonary in nature. We got a chest x-ray and blood work. The chest x-ray was normal. Her blood work showed hyperkalemia.    I saw the patient in  November 12, 2020 at that time she had reported that she was experiencing significant dizziness I stop her hydrochlorothiazide kept the patient on her irbesartan and verapamil.     I saw the patient on January 09, 2021 at that time we discussed her monitor which show frequent PACs while she was on verapamil.  Because of this was stopped verapamil and started the patient on propanolol 10 mg twice a day.  She also was hypotensive so we held her irbesartan.   I saw the patient on 02/13/2021 at that time she had had significant A. fib was unable to do any of her ADLs.  Stopped the propanolol for the patient and we started.  She is not having significant palpitations.  But her blood pressure.  Also experiencing some GI issues and recommended that she see her PCP.   At her visit on March 12, 2021 she still was experiencing elevated blood pressure.  She also was experiencing a lot of stress as she had lost her son and was going to the funeral the next  day.   At her last visit in August 2022 at that time she was doing well since we started the clonidine at nighttime.  We reviewed her echocardiogram. During that visit she has some fatigue but had declined any sleep study as well as ischemic evaluation  Today she tells me that she has been experiencing increasing beat.  Past Medical History:  Diagnosis Date   Accelerated hypertension 12/04/2015   AF (paroxysmal atrial fibrillation) ([place]) 06/12/2020   Anxiety    Bilateral impacted cerumen 02/12/2018   Breast cancer ([place])    right   CN VI palsy, left 03/13/2019   Cyst of joint of shoulder 12/20/2017   Dizziness 11/12/2020   DLBCL (diffuse large B cell lymphoma) ([place]) 02/29/2016   [place] of macula of both eyes 02/29/2016   Dry eyes, bilateral 02/29/2016   Dyslipidemia, goal LDL below 130 10/26/2016   Dysphagia 05/25/2015   Dyspnea 05/25/2015   Essential hypertension 05/27/2015   Excessive cerumen in ear canal, bilateral 04/26/2017   Frequent PAC (premature atrial contraction) 01/09/2021   Gastroesophageal reflux disease 06/12/2020   Glaucoma 06/12/2020   History of B-cell lymphoma 10/26/2016   History of breast cancer 06/12/2020   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 2013 rt radiation with balloon   Hyperlipidemia  Hypertension    Hyponatremia 05/25/2015   Formatting of this note might be different from the original. Mild and likely 2ry to diuretic use   Idiopathic peripheral neuropathy 10/26/2016   Insomnia 06/12/2020   Knee effusion, right 01/22/2016   Laryngeal edema 05/25/2015   Lymphoma ([place]) 05/05/2015   Microcalcification of left breast on mammogram 09/27/2017   Murmur, cardiac 07/11/2020   Osteopenia of left thigh 10/26/2016   Other visual disturbances 12/04/2015   PAF (paroxysmal atrial fibrillation) ([place]) 07/11/2020   PAT (paroxysmal atrial tachycardia) ([place]) 01/09/2021   Poor balance  06/12/2020   Primary hypertension 11/12/2020   Primary open angle glaucoma of left eye, moderate stage 02/29/2016   Primary osteoarthritis of both knees 01/22/2016   Primary osteoarthritis of both shoulders 04/12/2017   Primary osteoarthritis of left knee 01/11/2017   Pseudophakia of both eyes 02/29/2016   PVD (posterior vitreous detachment), both eyes 02/29/2016   Radiation injury 05/27/2015   Right rotator cuff tear arthropathy 01/03/2018   Sensorineural hearing loss (SNHL) of both ears 04/26/2017   Shortness of breath 02/13/2021   Varicose veins of left lower extremity 06/11/2020    Past Surgical History:  Procedure Laterality Date   BLADDER SUSPENSION     BREAST SURGERY Right 2008   lumpectomy   LAPAROSCOPIC HYSTERECTOMY     LYMPHADENECTOMY Right 2016    Current Medications: Current Meds  Medication Sig   b complex vitamins capsule Take 1 capsule by mouth daily.   citalopram (CELEXA) 20 MG tablet Take 1 tablet by mouth once daily   clobetasol cream (TEMOVATE) 0.05 %    cloNIDine (CATAPRES) 0.1 MG tablet Take 1 tablet (0.1 mg total) by mouth at bedtime.   COVID-19 mRNA bivalent vaccine, Pfizer, (PFIZER COVID-19 VAC BIVALENT) injection Inject into the muscle.   erythromycin ophthalmic ointment Place 1 application into both eyes at bedtime.   irbesartan (AVAPRO) 75 MG tablet Take 1 tablet (75 mg total) by mouth daily.   LORazepam (ATIVAN) 1 MG tablet TAKE 1 TABLET BY MOUTH AT BEDTIME   omeprazole (PRILOSEC) 40 MG capsule Take 1 capsule (40 mg total) by mouth daily.   OVER THE COUNTER MEDICATION Once PRN. Preservative Free Moisture Eye drops.   ROCKLATAN 0.02-0.005 % SOLN Apply to eye.   verapamil (CALAN-SR) 180 MG CR tablet Take 1 tablet (180 mg total) by mouth at bedtime.   [DISCONTINUED] verapamil (CALAN-SR) 120 MG CR tablet Take 1 tablet by mouth once daily     Allergies:   Bimatoprost, Levofloxacin, Penicillins, Shellfish allergy, Statins, Atorvastatin, Brimonidine, Dorzolamide  hcl-timolol mal, Lovastatin, and Perindopril   Social History   Socioeconomic History   Marital status: Widowed    Spouse name: Not on file   Number of children: Not on file   Years of education: Not on file   Highest education level: Not on file  Occupational History   Occupation: Retired  Tobacco Use   Smoking status: Former   Smokeless tobacco: Never  Scientific laboratory technician Use: Never used  Substance and Sexual Activity   Alcohol use: Yes    Alcohol/week: 3.0 standard drinks    Types: 3 Glasses of wine per week    Comment: wine 3 x per week   Drug use: Never   Sexual activity: Not Currently  Other Topics Concern   Not on file  Social History Narrative   Grew up in [place], moved to [place] in her 20's   Retired Editor, 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  ROS:   Review of Systems  Constitution: Negative for decreased appetite, fever and weight gain.  HENT: Negative for congestion, ear discharge, hoarse voice and sore throat.   Eyes: Negative for discharge, redness, vision loss in right eye and visual halos.  Cardiovascular: Report palpitations.  Negative for chest pain, dyspnea on exertion, leg swelling, orthopnea. Respiratory: Negative for cough, hemoptysis, shortness of breath and snoring.   Endocrine: Negative for heat intolerance and polyphagia.  Hematologic/Lymphatic:  Negative for bleeding problem. Does not bruise/bleed easily.  Skin: Negative for flushing, nail changes, rash and suspicious lesions.  Musculoskeletal: Negative for arthritis, joint pain, muscle cramps, myalgias, neck pain and stiffness.  Gastrointestinal: Negative for abdominal pain, bowel incontinence, diarrhea and excessive appetite.  Genitourinary: Negative for decreased libido, genital sores and incomplete emptying.  Neurological: Negative for brief paralysis, focal weakness, headaches and loss of balance.  Psychiatric/Behavioral: Negative for altered mental status, depression and suicidal ideas.  Allergic/Immunologic: Negative for HIV exposure and persistent infections.    EKGs/Labs/Other Studies Reviewed:    The following studies were reviewed today:   EKG:  None today   TTE 07/10/2021 IMPRESSIONS    1. Left ventricular ejection fraction, by estimation, is 65 to 70%. The  left ventricle has normal function. The left ventricle has no regional  wall motion abnormalities. There is moderate left ventricular hypertrophy.  Left ventricular diastolic  parameters are consistent with Grade I diastolic dysfunction (impaired  relaxation). Elevated left ventricular end-diastolic pressure. The E/e' is  15.   2. Right ventricular systolic function is normal. The right ventricular  size is normal.   3. The mitral valve is abnormal. Trivial mitral valve regurgitation.   4. The aortic valve is tricuspid. Aortic valve regurgitation is not  visualized. Mild aortic valve sclerosis is present, with no evidence of  aortic valve stenosis.   Comparison(s): Changes from prior study are noted. 08/08/2020: LVEF 60-65%,  aortic valve sclerosis, grade 1 DD.   FINDINGS   Left Ventricle: Left ventricular ejection fraction, by estimation, is 65  to 70%. The left ventricle has normal function. The left ventricle has no  regional wall motion abnormalities. The left ventricular internal cavity  size was  normal in size. There is   moderate left ventricular hypertrophy. Left ventricular diastolic  parameters are consistent with Grade I diastolic dysfunction (impaired  relaxation). Elevated left ventricular end-diastolic pressure. The E/e' is  16.   Right Ventricle: The right ventricular size is normal. No increase in  right ventricular wall thickness. Right ventricular systolic function is  normal.   Left Atrium: Left atrial size was normal in size.   Right Atrium: Right atrial size was normal in size.   Pericardium: There is no evidence of pericardial effusion.   Mitral Valve:  The mitral valve is abnormal. There is mild thickening of  the mitral valve leaflet(s). There is mild calcification of the mitral  valve leaflet(s). Trivial mitral valve regurgitation.   Tricuspid Valve: The tricuspid valve is grossly normal. Tricuspid valve  regurgitation is trivial.   Aortic Valve: The aortic valve is tricuspid. Aortic valve regurgitation is  not visualized. Mild aortic valve sclerosis is present, with no evidence  of aortic valve stenosis.   Pulmonic Valve: The pulmonic valve was normal in structure. Pulmonic valve  regurgitation is not visualized.   Aorta: The aortic root and ascending aorta are structurally normal, with  no evidence of dilitation.   Venous: The inferior vena cava was not well visualized.   IAS/Shunts: No atrial level shunt detected by color flow Doppler.   Recent Labs: 06/22/2021: Hemoglobin 13.4; Magnesium 2.3; Platelets 151; TSH 3.950 11/06/2021: BUN 31; Creatinine, Ser 0.63; Potassium 4.8; Sodium 137  Recent Lipid Panel No results found for: CHOL, TRIG, HDL, CHOLHDL, VLDL, LDLCALC, LDLDIRECT  Physical Exam:    VS:  BP (!) 146/88    Pulse (!) 104    Ht 5\' 5"  (1.651 m)    Wt 166 lb 12.8 oz (75.7 kg)    SpO2 95%    BMI 27.76 kg/m     Wt Readings from Last 3 Encounters:  12/31/21 166 lb 12.8 oz (75.7 kg)  11/06/21 168 lb (76.2 kg)  06/22/21 166 lb 0.6 oz  (75.3 kg)     GEN: Well nourished, well developed in no acute distress HEENT: Normal NECK: No JVD; No carotid bruits LYMPHATICS: No lymphadenopathy CARDIAC: S1S2 noted,RRR, no murmurs, rubs, gallops RESPIRATORY:  Clear to auscultation without rales, wheezing or rhonchi  ABDOMEN: Soft, non-tender, non-distended, +bowel sounds, no guarding. EXTREMITIES: No edema, No cyanosis, no clubbing MUSCULOSKELETAL:  No deformity  SKIN: Warm and dry NEUROLOGIC:  Alert and oriented x 3, non-focal PSYCHIATRIC:  Normal affect, good insight  ASSESSMENT:    1. Essential hypertension   2. Frequent PAC (premature atrial contraction)   3. PAT (paroxysmal atrial tachycardia) ([place])   4. Dyslipidemia, goal LDL below 130   5. Other fatigue    PLAN:    She is experiencing significant heart rate causing palpitations therefore I will increase her up to 180 mg daily.  Her blood pressure is also slightly elevated this will help.   The patient is in agreement with the above plan. The patient left the office in stable condition.  The patient will follow up in   Medication Adjustments/Labs and Tests Ordered: Current medicines are reviewed at length with the patient today.  Concerns regarding medicines are outlined above.  No orders of the defined types were placed in this encounter.  Meds ordered this encounter  Medications   verapamil (CALAN-SR) 180 MG CR tablet    Sig: Take 1 tablet (180 mg total) by mouth at bedtime.    Dispense:  90 tablet    Refill:  3    Patient Instructions  Medication Instructions:  Your physician has recommended you make the following change in your medication: \ INCREASE: Verapamil 180 mg once daily *If you need a refill on your cardiac medications before your next appointment, please call your pharmacy*   Lab Work: None If you have labs (blood work) drawn today and your tests are completely normal, you will receive your results only by: [place] (if you have  MyChart) OR A paper copy in the mail If you have any lab test that  is abnormal or we need to change your treatment, we will call you to review the results.   Testing/Procedures: None   Follow-Up: At [facility], you and your health needs are our priority.  As part of our continuing mission to provide you with exceptional heart care, we have created designated Provider Care Teams.  These Care Teams include your primary Cardiologist (physician) and Advanced Practice Providers (APPs -  Physician Assistants and Nurse Practitioners) who all work together to provide you with the care you need, when you need it.  We recommend signing up for the patient portal called "MyChart".  Sign up information is provided on this After Visit Summary.  MyChart is used to connect with patients for Virtual Visits (Telemedicine).  Patients are able to view lab/test results, encounter notes, upcoming appointments, etc.  Non-urgent messages can be sent to your provider as well.   To learn more about what you can do with MyChart, go to [email or web address].    Your next appointment:   6 month(s)  The format for your next appointment:   In Person  Provider:   Berniece Salines, DO     Other Instructions     Adopting a Healthy Lifestyle.  Know what a healthy weight is for you (roughly BMI <25) and aim to maintain this   Aim for 7+ servings of fruits and vegetables daily   65-80+ fluid ounces of water or unsweet tea for healthy kidneys   Limit to max 1 drink of alcohol per day; avoid smoking/tobacco   Limit animal fats in diet for cholesterol and heart health - choose grass fed whenever available   Avoid highly processed foods, and foods high in saturated/trans fats   Aim for low stress - take time to unwind and care for your mental health   Aim for 150 min of moderate intensity exercise weekly for heart health, and weights twice weekly for bone health   Aim for 7-9 hours of sleep daily   When it  comes to diets, agreement about the perfect plan isnt easy to find, even among the experts. Experts at the [employer] developed an idea known as the Healthy Eating Plate. Just imagine a plate divided into logical, healthy portions.   The emphasis is on diet quality:   Load up on vegetables and fruits - one-half of your plate: Aim for color and variety, and remember that potatoes dont count.   Go for whole grains - one-quarter of your plate: Whole wheat, barley, wheat berries, quinoa, oats, brown rice, and foods made with them. If you want pasta, go with whole wheat pasta.   Protein power - one-quarter of your plate: Fish, chicken, beans, and nuts are all healthy, versatile protein sources. Limit red meat.   The diet, however, does go beyond the plate, offering a few other suggestions.   Use healthy plant oils, such as olive, canola, soy, corn, sunflower and peanut. Check the labels, and avoid partially hydrogenated oil, which have unhealthy trans fats.   If youre thirsty, drink water. Coffee and tea are good in moderation, but skip sugary drinks and limit milk and dairy products to one or two daily servings.   The type of carbohydrate in the diet is more important than the amount. Some sources of carbohydrates, such as vegetables, fruits, whole grains, and beans-are healthier than others.   Finally, stay active  Signed, Berniece Salines, DO  12/31/2021 5:26 PM    [facility]

## 2022-01-01 ENCOUNTER — Telehealth: Payer: Self-pay | Admitting: Family

## 2022-01-01 NOTE — Telephone Encounter (Signed)
Left message for patient to call back and schedule Medicare Annual Wellness Visit (AWV) either virtually or in office.  AWVI DUE 11/22/2009 PER [place] please schedule at anytime with health coach

## 2022-01-01 NOTE — Telephone Encounter (Signed)
Patient called back and states she does need this appointment and is good without it.

## 2022-01-05 ENCOUNTER — Telehealth (HOSPITAL_BASED_OUTPATIENT_CLINIC_OR_DEPARTMENT_OTHER): Payer: Self-pay

## 2022-01-29 ENCOUNTER — Ambulatory Visit (INDEPENDENT_AMBULATORY_CARE_PROVIDER_SITE_OTHER): Payer: PPO

## 2022-01-29 DIAGNOSIS — Z0000 Encounter for general adult medical examination without abnormal findings: Secondary | ICD-10-CM | POA: Diagnosis not present

## 2022-01-29 NOTE — Patient Instructions (Signed)
Carla Chandler , Thank you for taking time to come for your Medicare Wellness Visit. I appreciate your ongoing commitment to your health goals. Please review the following plan we discussed and let me know if I can assist you in the future.   Screening recommendations/referrals: Colonoscopy: no longer needed Mammogram: no longer needed Bone Density: ordered 11/06/21 Recommended yearly ophthalmology/optometry visit for glaucoma screening and checkup Recommended yearly dental visit for hygiene and checkup  Vaccinations: Influenza vaccine: Due-May obtain vaccine at your local pharmacy.  Pneumococcal vaccine: up to date Tdap vaccine: Due-May obtain vaccine at your local pharmacy.  Shingles vaccine: Due-May obtain vaccine at your local pharmacy.    Covid-19:completed  Advanced directives: Yes, will provide copy  Conditions/risks identified: see problem list   Next appointment: Follow up in one year for your annual wellness visit    Preventive Care 65 Years and Older, Female Preventive care refers to lifestyle choices and visits with your health care provider that can promote health and wellness. What does preventive care include? A yearly physical exam. This is also called an annual well check. Dental exams once or twice a year. Routine eye exams. Ask your health care provider how often you should have your eyes checked. Personal lifestyle choices, including: Daily care of your teeth and gums. Regular physical activity. Eating a healthy diet. Avoiding tobacco and drug use. Limiting alcohol use. Practicing safe sex. Taking low-dose aspirin every day. Taking vitamin and mineral supplements as recommended by your health care provider. What happens during an annual well check? The services and screenings done by your health care provider during your annual well check will depend on your age, overall health, lifestyle risk factors, and family history of disease. Counseling  Your  health care provider may ask you questions about your: Alcohol use. Tobacco use. Drug use. Emotional well-being. Home and relationship well-being. Sexual activity. Eating habits. History of falls. Memory and ability to understand (cognition). Work and work Statistician. Reproductive health. Screening  You may have the following tests or measurements: Height, weight, and BMI. Blood pressure. Lipid and cholesterol levels. These may be checked every 5 years, or more frequently if you are over 9 years old. Skin check. Lung cancer screening. You may have this screening every year starting at age 66 if you have a 30-pack-year history of smoking and currently smoke or have quit within the past 15 years. Fecal occult blood test (FOBT) of the stool. You may have this test every year starting at age 43. Flexible sigmoidoscopy or colonoscopy. You may have a sigmoidoscopy every 5 years or a colonoscopy every 10 years starting at age 78. Hepatitis C blood test. Hepatitis B blood test. Sexually transmitted disease (STD) testing. Diabetes screening. This is done by checking your blood sugar (glucose) after you have not eaten for a while (fasting). You may have this done every 1-3 years. Bone density scan. This is done to screen for osteoporosis. You may have this done starting at age 71. Mammogram. This may be done every 1-2 years. Talk to your health care provider about how often you should have regular mammograms. Talk with your health care provider about your test results, treatment options, and if necessary, the need for more tests. Vaccines  Your health care provider may recommend certain vaccines, such as: Influenza vaccine. This is recommended every year. Tetanus, diphtheria, and acellular pertussis (Tdap, Td) vaccine. You may need a Td booster every 10 years. Zoster vaccine. You may need this after age 20. Pneumococcal  13-valent conjugate (PCV13) vaccine. One dose is recommended after age  72. Pneumococcal polysaccharide (PPSV23) vaccine. One dose is recommended after age 76. Talk to your health care provider about which screenings and vaccines you need and how often you need them. This information is not intended to replace advice given to you by your health care provider. Make sure you discuss any questions you have with your health care provider. Document Released: 12/05/2015 Document Revised: 07/28/2016 Document Reviewed: 09/09/2015 Elsevier Interactive Patient Education  2017 [employer] Prevention in the Home Falls can cause injuries. They can happen to people of all ages. There are many things you can do to make your home safe and to help prevent falls. What can I do on the outside of my home? Regularly fix the edges of walkways and driveways and fix any cracks. Remove anything that might make you trip as you walk through a door, such as a raised step or threshold. Trim any bushes or trees on the path to your home. Use bright outdoor lighting. Clear any walking paths of anything that might make someone trip, such as rocks or tools. Regularly check to see if handrails are loose or broken. Make sure that both sides of any steps have handrails. Any raised decks and porches should have guardrails on the edges. Have any leaves, snow, or ice cleared regularly. Use sand or salt on walking paths during winter. Clean up any spills in your garage right away. This includes oil or grease spills. What can I do in the bathroom? Use night lights. Install grab bars by the toilet and in the tub and shower. Do not use towel bars as grab bars. Use non-skid mats or decals in the tub or shower. If you need to sit down in the shower, use a plastic, non-slip stool. Keep the floor dry. Clean up any water that spills on the floor as soon as it happens. Remove soap buildup in the tub or shower regularly. Attach bath mats securely with double-sided non-slip rug tape. Do not have throw  rugs and other things on the floor that can make you trip. What can I do in the bedroom? Use night lights. Make sure that you have a light by your bed that is easy to reach. Do not use any sheets or blankets that are too big for your bed. They should not hang down onto the floor. Have a firm chair that has side arms. You can use this for support while you get dressed. Do not have throw rugs and other things on the floor that can make you trip. What can I do in the kitchen? Clean up any spills right away. Avoid walking on wet floors. Keep items that you use a lot in easy-to-reach places. If you need to reach something above you, use a strong step stool that has a grab bar. Keep electrical cords out of the way. Do not use floor polish or wax that makes floors slippery. If you must use wax, use non-skid floor wax. Do not have throw rugs and other things on the floor that can make you trip. What can I do with my stairs? Do not leave any items on the stairs. Make sure that there are handrails on both sides of the stairs and use them. Fix handrails that are broken or loose. Make sure that handrails are as long as the stairways. Check any carpeting to make sure that it is firmly attached to the stairs. Fix any carpet  that is loose or worn. Avoid having throw rugs at the top or bottom of the stairs. If you do have throw rugs, attach them to the floor with carpet tape. Make sure that you have a light switch at the top of the stairs and the bottom of the stairs. If you do not have them, ask someone to add them for you. What else can I do to help prevent falls? Wear shoes that: Do not have high heels. Have rubber bottoms. Are comfortable and fit you well. Are closed at the toe. Do not wear sandals. If you use a stepladder: Make sure that it is fully opened. Do not climb a closed stepladder. Make sure that both sides of the stepladder are locked into place. Ask someone to hold it for you, if  possible. Clearly mark and make sure that you can see: Any grab bars or handrails. First and last steps. Where the edge of each step is. Use tools that help you move around (mobility aids) if they are needed. These include: Canes. Walkers. Scooters. Crutches. Turn on the lights when you go into a dark area. Replace any light bulbs as soon as they burn out. Set up your furniture so you have a clear path. Avoid moving your furniture around. If any of your floors are uneven, fix them. If there are any pets around you, be aware of where they are. Review your medicines with your doctor. Some medicines can make you feel dizzy. This can increase your chance of falling. Ask your doctor what other things that you can do to help prevent falls. This information is not intended to replace advice given to you by your health care provider. Make sure you discuss any questions you have with your health care provider. Document Released: 09/04/2009 Document Revised: 04/15/2016 Document Reviewed: 12/13/2014 Elsevier Interactive Patient Education  2017 [employer].

## 2022-01-29 NOTE — Progress Notes (Signed)
Subjective:   Carla Chandler is a 88 y.o. female who presents for an Initial Medicare Annual Wellness Visit.  I connected with  Carla Chandler on 01/29/22 by a audio enabled telemedicine application and verified that I am speaking with the correct person using two identifiers.  Patient Location: Home  Provider Location: Office/Clinic  I discussed the limitations of evaluation and management by telemedicine. The patient expressed understanding and agreed to proceed.   Review of Systems     Cardiac Risk Factors include: advanced age (>23mn, >>10women);dyslipidemia;hypertension     Objective:    Today's Vitals   01/29/22 1321  PainSc: 6    There is no height or weight on file to calculate BMI.  Advanced Directives 01/29/2022  Does Patient Have a Medical Advance Directive? Yes  Type of AParamedicof APeaseLiving will;Out of facility DNR (pink MOST or yellow form)  Copy of [place]? No - copy requested    Current Medications (verified) Outpatient Encounter Medications as of 01/29/2022  Medication Sig   b complex vitamins capsule Take 1 capsule by mouth daily.   citalopram (CELEXA) 20 MG tablet Take 1 tablet by mouth once daily   clobetasol cream (TEMOVATE) 0.05 %    cloNIDine (CATAPRES) 0.1 MG tablet Take 1 tablet (0.1 mg total) by mouth at bedtime.   erythromycin ophthalmic ointment Place 1 application into both eyes at bedtime.   Guselkumab (TREMFYA [place]) Inject into the skin.   irbesartan (AVAPRO) 75 MG tablet Take 1 tablet (75 mg total) by mouth daily.   LORazepam (ATIVAN) 1 MG tablet TAKE 1 TABLET BY MOUTH AT BEDTIME   OVER THE COUNTER MEDICATION Once PRN. Preservative Free Moisture Eye drops.   ROCKLATAN 0.02-0.005 % SOLN Apply to eye.   verapamil (CALAN-SR) 180 MG CR tablet Take 1 tablet (180 mg total) by mouth at bedtime.   [DISCONTINUED] omeprazole (PRILOSEC) 40 MG capsule Take 1 capsule (40 mg total)  by mouth daily.   [DISCONTINUED] COVID-19 mRNA bivalent vaccine, Pfizer, (PFIZER COVID-19 VAC BIVALENT) injection Inject into the muscle.   [DISCONTINUED] famotidine (PEPCID) 20 MG tablet Take 1 tablet by mouth twice daily (Patient not taking: Reported on 12/31/2021)   No facility-administered encounter medications on file as of 01/29/2022.    Allergies (verified) Bimatoprost, Levofloxacin, Penicillins, Shellfish allergy, Statins, Atorvastatin, Brimonidine, Dorzolamide hcl-timolol mal, Lovastatin, and Perindopril   History: Past Medical History:  Diagnosis Date   Accelerated hypertension 12/04/2015   AF ([place]oxysmal atrial fibrillation) ([facility] 06/12/2020   Anxiety    Bilateral impacted cerumen 02/12/2018   Breast cancer ([facility]    right   CN VI palsy, left 03/13/2019   Cyst of joint of shoulder 12/20/2017   Dizziness 11/12/2020   DLBCL (diffuse large B cell lymphoma) ([facility] 02/29/2016   [place] of macula of both eyes 02/29/2016   Dry eyes, bilateral 02/29/2016   Dyslipidemia, goal LDL below 130 10/26/2016   Dysphagia 05/25/2015   Dyspnea 05/25/2015   Essential hypertension 05/27/2015   Excessive cerumen in ear canal, bilateral 04/26/2017   Frequent PAC (premature atrial contraction) 01/09/2021   Gastroesophageal reflux disease 06/12/2020   Glaucoma 06/12/2020   History of B-cell lymphoma 10/26/2016   History of breast cancer 06/12/2020   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 2013 rt radiation with balloon   Hyperlipidemia    Hypertension  Hyponatremia 05/25/2015   Formatting of this note might be different from the original. Mild and likely 2ry to diuretic use   Idiopathic peripheral neuropathy 10/26/2016   Insomnia 06/12/2020   Knee effusion, right 01/22/2016   Laryngeal edema 05/25/2015   Lymphoma ([place]) 05/05/2015   Microcalcification of left breast on mammogram 09/27/2017   Murmur, cardiac 07/11/2020    Osteopenia of left thigh 10/26/2016   Other visual disturbances 12/04/2015   PAF (paroxysmal atrial fibrillation) ([place]) 07/11/2020   PAT (paroxysmal atrial tachycardia) ([place]) 01/09/2021   Poor balance 06/12/2020   Primary hypertension 11/12/2020   Primary open angle glaucoma of left eye, moderate stage 02/29/2016   Primary osteoarthritis of both knees 01/22/2016   Primary osteoarthritis of both shoulders 04/12/2017   Primary osteoarthritis of left knee 01/11/2017   Pseudophakia of both eyes 02/29/2016   PVD (posterior vitreous detachment), both eyes 02/29/2016   Radiation injury 05/27/2015   Right rotator cuff tear arthropathy 01/03/2018   Sensorineural hearing loss (SNHL) of both ears 04/26/2017   Shortness of breath 02/13/2021   Varicose veins of left lower extremity 06/11/2020   Past Surgical History:  Procedure Laterality Date   BLADDER SUSPENSION     BREAST SURGERY Right 2008   lumpectomy   LAPAROSCOPIC HYSTERECTOMY     LYMPHADENECTOMY Right 2016   Family History  Problem Relation Age of Onset   Heart attack Father    Lymphoma Sister    Aneurysm Brother    Cancer Sister    Stroke Sister    Social History   Socioeconomic History   Marital status: Widowed    Spouse name: Not on file   Number of children: Not on file   Years of education: Not on file   Highest education level: Not on file  Occupational History   Occupation: Retired  Tobacco Use   Smoking status: Former   Smokeless tobacco: Never  Scientific laboratory technician Use: Never used  Substance and Sexual Activity   Alcohol use: Yes    Alcohol/week: 3.0 standard drinks    Types: 3 Glasses of wine per week    Comment: wine 3 x per week   Drug use: Never   Sexual activity: Not Currently  Other Topics Concern   Not on file  Social History Narrative   Grew up in [place], moved to [place] in her 20's   Retired Editor, 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   Husband died when she was in her 52's   Son and 2 daughters- all  local   Has 5 grandchildren and 3 great grandchildren   Lives by herself   No pets   Used to enjoy golf   Enjoys golf   Social Determinants of Radio broadcast assistant Strain: Not on Art therapist Insecurity: Not on file  Transportation Needs: Not on file  Physical Activity: Not on file  Stress: Not on file  Social Connections: Not on file    Tobacco Counseling Counseling given: Not Answered   Clinical Intake:  Pre-visit preparation completed: Yes  Pain : 0-10 Pain Score: 6  Pain Type: Chronic pain Pain Location: Other (Comment) Pain Descriptors / Indicators: Aching, Throbbing Pain Onset: More than a month ago Pain Frequency: Constant     Nutritional Risks: None Diabetes: No  How often do you need to have someone help you when you read instructions, pamphlets, or other written materials from your doctor or pharmacy?: 1 - Never  Diabetic?No  Interpreter Needed?: No  Information entered by :: [place] of Daily Living In your present state of health, do you have any difficulty performing the following activities: 01/29/2022  Hearing? N  Vision? Y  Difficulty concentrating or making decisions? N  Walking or climbing stairs? Y  Dressing or bathing? N  Doing errands, shopping? Y  Preparing Food and eating ? N  Using the Toilet? N  In the past six months, have you accidently leaked urine? N  Do you have problems with loss of bowel control? Y  Managing your Medications? N  Managing your Finances? N  Housekeeping or managing your Housekeeping? N  Some recent data might be hidden    Patient Care Team: [name], NP as PCP - General (Internal Medicine) Berniece Salines, DO as PCP - Cardiology (Cardiology)  Indicate any recent Medical Services you may have received from other than [facility] providers in the past year (date may be approximate).     Assessment:   This is a routine wellness examination for [place].  Hearing/Vision  screen No results found.  Dietary issues and exercise activities discussed: Current Exercise Habits: Home exercise routine, Type of exercise: walking, Time (Minutes): 60, Frequency (Times/Week): 7, Weekly Exercise (Minutes/Week): 420, Intensity: Mild, Exercise limited by: None identified   Goals Addressed   None    Depression Screen PHQ 2/9 Scores 01/29/2022 04/10/2021 06/11/2020  PHQ - 2 Score 0 2 0    Fall Risk Fall Risk  01/29/2022 04/10/2021  Falls in the past year? 0 1  Number falls in past yr: 0 1  Injury with Fall? 0 0  Risk for fall due to : No Fall Risks -  Follow up Falls evaluation completed -    FALL RISK PREVENTION PERTAINING TO THE HOME:  Any stairs in or around the home? Yes  If so, are there any without handrails? Yes  Home free of loose throw rugs in walkways, pet beds, electrical cords, etc? Yes  Adequate lighting in your home to reduce risk of falls? Yes   ASSISTIVE DEVICES UTILIZED TO PREVENT FALLS:  Life alert? No  Use of a cane, walker or w/c? Yes  Grab bars in the bathroom? Yes  Shower chair or bench in shower? Yes  Elevated toilet seat or a handicapped toilet? Yes   TIMED UP AND GO:  Was the test performed? No .   Cognitive Function:     6CIT Screen 01/29/2022  What Year? 0 points  What month? 0 points  What time? 0 points  Count back from 20 0 points  Months in reverse 0 points  Repeat phrase 4 points  Total Score 4    Immunizations Immunization History  Administered Date(s) Administered   PFIZER(Purple Top)SARS-COV-2 Vaccination 01/05/2020, 01/30/2020, 09/26/2020   Pfizer Covid-19 Vaccine Bivalent Booster 75yr & up 11/06/2021   Pneumococcal Conjugate-13 12/24/2004   Pneumococcal Polysaccharide-23 11/23/2004    TDAP status: Due, Education has been provided regarding the importance of this vaccine. Advised may receive this vaccine at local pharmacy or Health Dept. Aware to provide a copy of the vaccination record if obtained from  local pharmacy or Health Dept. Verbalized acceptance and understanding.  Flu Vaccine status: Due, Education has been provided regarding the importance of this vaccine. Advised may receive this vaccine at local pharmacy or Health Dept. Aware to provide a copy of the vaccination record if obtained from local pharmacy or Health Dept. Verbalized acceptance and understanding.  Pneumococcal vaccine status: Up to  date  Covid-19 vaccine status: Completed vaccines  Qualifies for Shingles Vaccine? Yes .  Zostavax completed No   Shingrix Completed?: No.    Education has been provided regarding the importance of this vaccine. Patient has been advised to call insurance company to determine out of pocket expense if they have not yet received this vaccine. Advised may also receive vaccine at local pharmacy or Health Dept. Verbalized acceptance and understanding.  Screening Tests Health Maintenance  Topic Date Due   TETANUS/TDAP  Never done   Zoster Vaccines- Shingrix (1 of 2) Never done   DEXA SCAN  Never done   Pneumonia Vaccine 18+ Years old  Completed   COVID-19 Vaccine  Completed   HPV VACCINES  Aged Out   INFLUENZA VACCINE  Discontinued    Health Maintenance  Health Maintenance Due  Topic Date Due   TETANUS/TDAP  Never done   Zoster Vaccines- Shingrix (1 of 2) Never done   DEXA SCAN  Never done    Colorectal cancer screening: No longer required.   Mammogram status: No longer required due to Aged out.  Bone Density status: Ordered 11/06/21. Pt provided with contact info and advised to call to schedule appt.  Lung Cancer Screening: (Low Dose CT Chest recommended if Age 60-80 years, 30 pack-year currently smoking OR have quit w/in 15years.) does not qualify.   Lung Cancer Screening Referral: N/A  Additional Screening:  Hepatitis C Screening: does not qualify; Completed Aged out  Vision Screening: Recommended annual ophthalmology exams for early detection of glaucoma and other  disorders of the eye. Is the patient up to date with their annual eye exam?  Yes  Who is the provider or what is the name of the office in which the patient attends annual eye exams? Dr. Amanda Cockayne If pt is not established with a provider, would they like to be referred to a provider to establish care? No .   Dental Screening: Recommended annual dental exams for proper oral hygiene  Community Resource Referral / Chronic Care Management: CRR required this visit?  No   CCM required this visit?  No      Plan:     I have personally reviewed and noted the following in the patients chart:   Medical and social history Use of alcohol, tobacco or illicit drugs  Current medications and supplements including opioid prescriptions. Patient is not currently taking opioid prescriptions. Functional ability and status Nutritional status Physical activity Advanced directives List of other physicians Hospitalizations, surgeries, and ER visits in previous 12 months Vitals Screenings to include cognitive, depression, and falls Referrals and appointments  In addition, I have reviewed and discussed with patient certain preventive protocols, quality metrics, and best practice recommendations. A written personalized care plan for preventive services as well as general preventive health recommendations were provided to patient.  Due to this being a telephonic visit, the after visit summary with patients personalized plan was offered to patient via mail or my-chart. Patient declined at this time.     Duard Brady Finley Chevez, CMA   01/29/2022   Nurse Notes: None

## 2022-02-19 DIAGNOSIS — L400 Psoriasis vulgaris: Secondary | ICD-10-CM | POA: Diagnosis not present

## 2022-03-16 ENCOUNTER — Other Ambulatory Visit: Payer: Self-pay | Admitting: Family

## 2022-03-16 NOTE — Telephone Encounter (Signed)
Requesting: lorazepam ?Contract: 11/06/21 ?UDS: 11/06/21 ?Last Visit: 11/06/21 ?Next Visit: 05/07/22 ?Last Refill: 12/15/21 ? ?Please Advise ? ?

## 2022-03-18 ENCOUNTER — Encounter: Payer: Self-pay | Admitting: Family Medicine

## 2022-03-18 ENCOUNTER — Ambulatory Visit (INDEPENDENT_AMBULATORY_CARE_PROVIDER_SITE_OTHER): Payer: PPO | Admitting: Family Medicine

## 2022-03-18 ENCOUNTER — Ambulatory Visit (HOSPITAL_BASED_OUTPATIENT_CLINIC_OR_DEPARTMENT_OTHER)
Admission: RE | Admit: 2022-03-18 | Discharge: 2022-03-18 | Disposition: A | Discharge status: Home / Self Care | Payer: PPO | Source: Ambulatory Visit | Attending: Family Medicine | Admitting: Family Medicine

## 2022-03-18 VITALS — BP 121/71 | HR 106 | Ht 65.0 in | Wt 167.2 lb

## 2022-03-18 DIAGNOSIS — H6123 Impacted cerumen, bilateral: Secondary | ICD-10-CM | POA: Diagnosis not present

## 2022-03-18 DIAGNOSIS — R059 Cough, unspecified: Secondary | ICD-10-CM | POA: Diagnosis not present

## 2022-03-18 DIAGNOSIS — R062 Wheezing: Secondary | ICD-10-CM

## 2022-03-18 DIAGNOSIS — R052 Subacute cough: Secondary | ICD-10-CM | POA: Insufficient documentation

## 2022-03-18 DIAGNOSIS — I700 Atherosclerosis of aorta: Secondary | ICD-10-CM | POA: Diagnosis not present

## 2022-03-18 MED ORDER — FLUTICASONE PROPIONATE HFA 44 MCG/ACT IN AERO: 2.0000 | INHALATION_SPRAY | Freq: Two times a day (BID) | RESPIRATORY_TRACT | 12 refills | Status: DC

## 2022-03-18 MED ORDER — DOXYCYCLINE HYCLATE 100 MG PO TABS: 100.0000 mg | ORAL_TABLET | Freq: Two times a day (BID) | ORAL | 0 refills | Status: AC

## 2022-03-18 NOTE — Patient Instructions (Signed)
Given duration of your symptoms, let's go ahead and try antibiotics. I'm adding an inhaler like we discussed to help with the shortness of breath/wheezing. Please review the attached handouts.  ?Chest xray today. We will update you with results.  ?Continue supportive measures including rest, hydration, humidifier use, steam showers, warm compresses to sinuses, warm liquids with lemon and honey, and over-the-counter cough, cold, and analgesics as needed.   ? ?Please contact office for follow-up if symptoms do not improve or worsen. Seek emergency care if symptoms become severe. ? ?

## 2022-03-18 NOTE — Progress Notes (Signed)
? ?Acute Office Visit ? ?Subjective:  ? ?  ?Patient ID: Carla Chandler, female    DOB: July 13, 1926, 88 y.o.   MRN: [id number] ? ?CC: cough, wheezing ? ? ?HPI ?Patient is in today for cough, wheezing.  ? ?Patient reports that for the past few months she has had a progressively worsening cough.  Reports that it is becoming productive with occasional green sputum.  She is also having some progressively worsening dyspnea and wheezing.  Associated symptoms include nasal congestion and postnasal drip, occasional headaches/dizziness.  She denies any fevers, chills, body aches, allergies, sinus pressure. ? ?She is also reporting bilateral cerumen impaction with muffled hearing.  She would like irrigation today.  No pain ? ? ? ?ROS ?All review of systems negative except what is listed in the HPI ? ? ?   ?Objective:  ?  ?BP 121/71   Pulse (!) 106   Ht '5\' 5"'$  (1.651 m)   Wt 167 lb 3.2 oz (75.8 kg)   SpO2 95%   BMI 27.82 kg/m?  ? ? ?Physical Exam ?Vitals reviewed.  ?Constitutional:   ?   Appearance: Normal appearance.  ?HENT:  ?   Head: Normocephalic and atraumatic.  ?   Right Ear: There is impacted cerumen.  ?   Left Ear: There is impacted cerumen.  ?Cardiovascular:  ?   Rate and Rhythm: Normal rate.  ?Pulmonary:  ?   Effort: Pulmonary effort is normal.  ?   Breath sounds: Wheezing and rhonchi present.  ?Musculoskeletal:  ?   Right lower leg: No edema.  ?   Left lower leg: No edema.  ?Skin: ?   General: Skin is warm and dry.  ?Neurological:  ?   General: No focal deficit present.  ?   Mental Status: She is alert and oriented to person, place, and time. Mental status is at baseline.  ?Psychiatric:     ?   Mood and Affect: Mood normal.     ?   Behavior: Behavior normal.     ?   Thought Content: Thought content normal.     ?   Judgment: Judgment normal.  ? ? ?No results found for any visits on 03/18/22. ? ? ?   ?Assessment & Plan:  ? ? ?1. Subacute cough ?2. Wheezing ?Given duration of your symptoms, let's go ahead and try  antibiotics. I'm adding an inhaler like we discussed to help with the shortness of breath/wheezing. Please review the attached handouts. -Hesitant to add albuterol given history of tachycardia/A-fib.  She is not interested in oral prednisone at this time due to side effects. ?Chest xray today. We will update you with results.  ?Continue supportive measures including rest, hydration, humidifier use, steam showers, warm compresses to sinuses, warm liquids with lemon and honey, and over-the-counter cough, cold, and analgesics as needed.   ?Patient aware of signs/symptoms requiring further/urgent evaluation.  ? ?- DG Chest 2 View; Future ?- doxycycline (VIBRA-TABS) 100 MG tablet; Take 1 tablet (100 mg total) by mouth 2 (two) times daily for 7 days.  Dispense: 14 tablet; Refill: 0 ?- fluticasone (FLOVENT HFA) 44 MCG/ACT inhaler; Inhale 2 puffs into the lungs in the morning and at bedtime.  Dispense: 1 each; Refill: 12 ? ?3. Cerumen impaction, bilateral  ?Indication: Cerumen impaction of the ear(s) - performed by CMA ?Medical necessity statement: On physical examination, cerumen impairs clinically significant portions of the external auditory canal, and tympanic membrane. Noted obstructive, copious cerumen that cannot be removed without magnification  and instrumentations requiring skills ?Consent: Discussed benefits and risks of procedure and verbal consent obtained ?Procedure: Patient was prepped for the procedure. Utilized an otoscope to assess and take note of the ear canal, the tympanic membrane, and the presence, amount, and placement of the cerumen. Gentle water irrigation and soft plastic curette was utilized to remove cerumen.  ?Post procedure examination: shows cerumen was completely removed. Patient tolerated procedure well. The patient is made aware that they may experience temporary vertigo, temporary hearing loss, and temporary discomfort. If these symptom last for more than 24 hours to call the clinic or  proceed to the ED. ? ? ?Return if symptoms worsen or fail to improve. ? ?Terrilyn Saver, NP ? ? ?

## 2022-04-05 ENCOUNTER — Other Ambulatory Visit: Payer: Self-pay | Admitting: Cardiology

## 2022-04-21 DIAGNOSIS — L400 Psoriasis vulgaris: Secondary | ICD-10-CM | POA: Diagnosis not present

## 2022-04-30 DIAGNOSIS — H4912 Fourth [trochlear] nerve palsy, left eye: Secondary | ICD-10-CM | POA: Diagnosis not present

## 2022-04-30 DIAGNOSIS — H401122 Primary open-angle glaucoma, left eye, moderate stage: Secondary | ICD-10-CM | POA: Diagnosis not present

## 2022-04-30 DIAGNOSIS — H401111 Primary open-angle glaucoma, right eye, mild stage: Secondary | ICD-10-CM | POA: Diagnosis not present

## 2022-05-04 ENCOUNTER — Other Ambulatory Visit: Payer: Self-pay | Admitting: Cardiology

## 2022-05-07 ENCOUNTER — Ambulatory Visit (INDEPENDENT_AMBULATORY_CARE_PROVIDER_SITE_OTHER): Payer: PPO | Admitting: Family

## 2022-05-07 VITALS — BP 109/64 | HR 67 | Temp 98.4°F | Resp 16 | Wt 167.0 lb

## 2022-05-07 DIAGNOSIS — K219 Gastro-esophageal reflux disease without esophagitis: Secondary | ICD-10-CM | POA: Diagnosis not present

## 2022-05-07 DIAGNOSIS — R519 Headache, unspecified: Secondary | ICD-10-CM | POA: Insufficient documentation

## 2022-05-07 DIAGNOSIS — G4700 Insomnia, unspecified: Secondary | ICD-10-CM | POA: Diagnosis not present

## 2022-05-07 DIAGNOSIS — R42 Dizziness and giddiness: Secondary | ICD-10-CM | POA: Diagnosis not present

## 2022-05-07 DIAGNOSIS — I10 Essential (primary) hypertension: Secondary | ICD-10-CM

## 2022-05-07 DIAGNOSIS — F419 Anxiety disorder, unspecified: Secondary | ICD-10-CM

## 2022-05-07 LAB — BASIC METABOLIC PANEL
BUN: 23 mg/dL (ref 6–23)
CO2: 30 mEq/L (ref 19–32)
Calcium: 9.5 mg/dL (ref 8.4–10.5)
Chloride: 99 mEq/L (ref 96–112)
Creatinine, Ser: 0.57 mg/dL (ref 0.40–1.20)
GFR: 77.79 mL/min (ref >60.00)
Glucose, Bld: 92 mg/dL (ref 70–99)
Potassium: 4.4 mEq/L (ref 3.5–5.1)
Sodium: 136 mEq/L (ref 135–145)

## 2022-05-07 MED ORDER — FAMOTIDINE 20 MG PO TABS: 20.0000 mg | ORAL_TABLET | Freq: Two times a day (BID) | ORAL | 5 refills | Status: DC

## 2022-05-07 NOTE — Assessment & Plan Note (Addendum)
Reports anxiety is well controlled on citalopram. Takes Ativan HS.

## 2022-05-07 NOTE — Assessment & Plan Note (Signed)
Stable with HS Lorazepam.

## 2022-05-07 NOTE — Assessment & Plan Note (Signed)
Had diarrhea on omeprazole.  Uncontrolled. Will add pepcid.

## 2022-05-07 NOTE — Progress Notes (Signed)
Subjective:   By signing my name below, I, [name], attest that this documentation has been prepared under the direction and in the presence of [name] NP, 05/07/2022   Patient ID: Carla Chandler, female    DOB: [birth year], 88 y.o.   MRN: [id number]  Chief Complaint  Patient presents with   Hypertension   Dizziness    Complains of dizziness    HPI Patient is in today for an office visit. She is accompanied by her daughter.   Dizziness: She complains of dizziness. She states symptoms have been occurring for an extended period of time. She also states that sitting is fine but when she get's up, symptoms appear. She uses her walker frequently but reports that she has trouble getting up the stairs. She states that her dizziness is worse now than it was a couple of months ago. Also when she looks at the sun, she gets wave like movement in her vision. She also experiences an associated symptom of dull headaches. When she closes her eyes for a period, her vision symptoms are alleviated but her headache persists. She reports a history of Glaucoma, peripheral double vision. She also reports that she used to experience migraines. She is not interested in being referred to a neurologist at this time.  Blood Pressure: As of today's visit, her blood pressure is elevating. She takes 0.1 Mg of Clonidine at night. She also takes 75 Mg of Irbesartan, and 180 Mg of Verapamil. She has a blood pressure cuff at home but states that the machine does not give accurate readings. BP Readings from Last 3 Encounters:  05/07/22 109/64  03/18/22 121/71  12/31/21 (!) 146/88   Pulse Readings from Last 3 Encounters:  05/07/22 67  03/18/22 (!) 106  12/31/21 (!) 104  Anxiety: She states that her anxiety is controlled. She is currently taking 20 mg of Citalopram. She also takes 1 Mg of Lorazepam daily and reports that she is sleeping normally.  Reflux: She states that she experiences reflux  symptoms. She states that when she took Omeprazole, she experienced diarrhea. She is interested in being prescribed a different medication for her symptoms.   Health Maintenance Due  Topic Date Due   TETANUS/TDAP  Never done   Zoster Vaccines- Shingrix (1 of 2) Never done   DEXA SCAN  Never done    Past Medical History:  Diagnosis Date   Accelerated hypertension 12/04/2015   AF (paroxysmal atrial fibrillation) (HCC) 06/12/2020   Anxiety    Bilateral impacted cerumen 02/12/2018   Breast cancer ([place])    right   CN VI palsy, left 03/13/2019   Cyst of joint of shoulder 12/20/2017   Dizziness 11/12/2020   DLBCL (diffuse large B cell lymphoma) ([place]) 02/29/2016   [place] of macula of both eyes 02/29/2016   Dry eyes, bilateral 02/29/2016   Dyslipidemia, goal LDL below 130 10/26/2016   Dysphagia 05/25/2015   Dyspnea 05/25/2015   Essential hypertension 05/27/2015   Excessive cerumen in ear canal, bilateral 04/26/2017   Frequent PAC (premature atrial contraction) 01/09/2021   Gastroesophageal reflux disease 06/12/2020   Glaucoma 06/12/2020   History of B-cell lymphoma 10/26/2016   History of breast cancer 06/12/2020   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 2013 rt radiation with balloon   Hyperlipidemia    Hypertension    Hyponatremia 05/25/2015   Formatting of this note might  be different from the original. Mild and likely 2ry to diuretic use   Idiopathic peripheral neuropathy 10/26/2016   Insomnia 06/12/2020   Knee effusion, right 01/22/2016   Laryngeal edema 05/25/2015   Lymphoma ([place]) 05/05/2015   Microcalcification of left breast on mammogram 09/27/2017   Murmur, cardiac 07/11/2020   Osteopenia of left thigh 10/26/2016   Other visual disturbances 12/04/2015   PAF (paroxysmal atrial fibrillation) ([place]) 07/11/2020   PAT (paroxysmal atrial tachycardia) ([place]) 01/09/2021   Poor balance 06/12/2020   Primary  hypertension 11/12/2020   Primary open angle glaucoma of left eye, moderate stage 02/29/2016   Primary osteoarthritis of both knees 01/22/2016   Primary osteoarthritis of both shoulders 04/12/2017   Primary osteoarthritis of left knee 01/11/2017   Pseudophakia of both eyes 02/29/2016   PVD (posterior vitreous detachment), both eyes 02/29/2016   Radiation injury 05/27/2015   Right rotator cuff tear arthropathy 01/03/2018   Sensorineural hearing loss (SNHL) of both ears 04/26/2017   Shortness of breath 02/13/2021   Varicose veins of left lower extremity 06/11/2020    Past Surgical History:  Procedure Laterality Date   BLADDER SUSPENSION     BREAST SURGERY Right 2008   lumpectomy   LAPAROSCOPIC HYSTERECTOMY     LYMPHADENECTOMY Right 2016    Family History  Problem Relation Age of Onset   Heart attack Father    Lymphoma Sister    Aneurysm Brother    Cancer Sister    Stroke Sister     Social History   Socioeconomic History   Marital status: Widowed    Spouse name: Not on file   Number of children: Not on file   Years of education: Not on file   Highest education level: Not on file  Occupational History   Occupation: Retired  Tobacco Use   Smoking status: Former   Smokeless tobacco: Never  Scientific laboratory technician Use: Never used  Substance and Sexual Activity   Alcohol use: Yes    Alcohol/week: 3.0 standard drinks of alcohol    Types: 3 Glasses of wine per week    Comment: wine 3 x per week   Drug use: Never   Sexual activity: Not Currently  Other Topics Concern   Not on file  Social History Narrative   Grew up in [place], moved to [place] in her 20's   Retired Editor, 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   Husband died when she was in her 22's   Son and 2 daughters- all local   Has 5 grandchildren and 3 great grandchildren   Lives by herself   No pets   Used to enjoy golf   Enjoys golf   Social Determinants of Health   Financial Resource Strain: Low Risk   (01/29/2022)   Overall Financial Resource Strain (CARDIA)    Difficulty of Paying Living Expenses: Not hard at all  Food Insecurity: No Food Insecurity (01/29/2022)   Hunger Vital Sign    Worried About Running Out of Food in the Last Year: Never true    Bell in the Last Year: Never true  Transportation Needs: No Transportation Needs (01/29/2022)   PRAPARE - Hydrologist (Medical): No    Lack of Transportation (Non-Medical): No  Physical Activity: Sufficiently Active (01/29/2022)   Exercise Vital Sign    Days of Exercise per Week: 7 days    Minutes of Exercise per Session: 60 min  Stress: No  Stress Concern Present (01/29/2022)   [place]    Feeling of Stress : Not at all  Social Connections: Moderately Isolated (01/29/2022)   Social Connection and Isolation Panel [NHANES]    Frequency of Communication with Friends and Family: More than three times a week    Frequency of Social Gatherings with Friends and Family: Three times a week    Attends Religious Services: More than 4 times per year    Active Member of Clubs or Organizations: No    Attends Archivist Meetings: Never    Marital Status: Widowed  Intimate Partner Violence: Not At Risk (01/29/2022)   Humiliation, Afraid, Rape, and Kick questionnaire    Fear of Current or Ex-Partner: No    Emotionally Abused: No    Physically Abused: No    Sexually Abused: No    Outpatient Medications Prior to Visit  Medication Sig Dispense Refill   b complex vitamins capsule Take 1 capsule by mouth daily.     citalopram (CELEXA) 20 MG tablet Take 1 tablet by mouth once daily 90 tablet 1   clobetasol cream (TEMOVATE) 0.05 %      cloNIDine (CATAPRES) 0.1 MG tablet TAKE 1 TABLET BY MOUTH AT BEDTIME 90 tablet 3   Guselkumab (TREMFYA [place]) Inject into the skin.     LORazepam (ATIVAN) 1 MG tablet TAKE 1 TABLET BY MOUTH AT BEDTIME 90 tablet 0    OVER THE COUNTER MEDICATION Once PRN. Preservative Free Moisture Eye drops.     ROCKLATAN 0.02-0.005 % SOLN Apply to eye.     verapamil (CALAN-SR) 180 MG CR tablet Take 1 tablet (180 mg total) by mouth at bedtime. 90 tablet 3   irbesartan (AVAPRO) 75 MG tablet Take 1 tablet by mouth once daily 90 tablet 0   erythromycin ophthalmic ointment Place 1 application into both eyes at bedtime.     fluticasone (FLOVENT HFA) 44 MCG/ACT inhaler Inhale 2 puffs into the lungs in the morning and at bedtime. 1 each 12   No facility-administered medications prior to visit.    Allergies  Allergen Reactions   Bimatoprost Other (See Comments)    toxicity   Levofloxacin Diarrhea   Penicillins Itching   Shellfish Allergy Other (See Comments)   Statins Other (See Comments)    Myalgias - "all statins"  Per pt   Atorvastatin Other (See Comments)   Brimonidine Other (See Comments)   Dorzolamide Hcl-Timolol Mal Other (See Comments)   Lovastatin Other (See Comments)   Perindopril Other (See Comments)    Review of Systems  Eyes:        (+) Wave-Like Vision  Neurological:  Positive for dizziness and headaches.       Objective:    Physical Exam Constitutional:      General: She is not in acute distress.    Appearance: Normal appearance. She is not ill-appearing.  HENT:     Head: Normocephalic and atraumatic.     Right Ear: External ear normal.     Left Ear: External ear normal.  Eyes:     Extraocular Movements: Extraocular movements intact.     Pupils: Pupils are equal, round, and reactive to light.  Cardiovascular:     Rate and Rhythm: Normal rate and regular rhythm.     Heart sounds: Normal heart sounds. No murmur heard.    No gallop.  Pulmonary:     Effort: Pulmonary effort is normal. No respiratory distress.  Breath sounds: Normal breath sounds. No wheezing or rales.  Musculoskeletal:     Comments: 5/5 strength in both upper and lower extremities  Skin:    General: Skin is warm  and dry.  Neurological:     Mental Status: She is alert and oriented to person, place, and time.     Cranial Nerves: No cranial nerve deficit.  Psychiatric:        Mood and Affect: Mood normal.        Behavior: Behavior normal.        Judgment: Judgment normal.     BP 109/64 (BP Location: Left Arm, Patient Position: Sitting, Cuff Size: Small)   Pulse 67   Temp 98.4 F (36.9 C) (Oral)   Resp 16   Wt 167 lb (75.8 kg)   SpO2 96%   BMI 27.79 kg/m  Wt Readings from Last 3 Encounters:  05/07/22 167 lb (75.8 kg)  03/18/22 167 lb 3.2 oz (75.8 kg)  12/31/21 166 lb 12.8 oz (75.7 kg)       Assessment & Plan:   Problem List Items Addressed This Visit       Unprioritized   Nonintractable headache    Sounds like she may be having some associated visual auras with these HA's so they may represent migraines. She used to have severe migraines when she was younger. Discussed possibility of referral to neurology but she declines at this time. We discussed use of PRN tylenol for HA.       Insomnia    Stable with HS Lorazepam.       Hypertension - Primary   Relevant Orders   Basic metabolic panel   Gastroesophageal reflux disease    Had diarrhea on omeprazole.  Uncontrolled. Will add pepcid.        Relevant Medications   famotidine (PEPCID) 20 MG tablet   Essential hypertension    Overtreated.  Suspect this is contributing to her dizziness. Will stop iresartan.       Dizziness    Suspect due to overtreatment of BP.        Anxiety    Reports anxiety is well controlled on citalopram. Takes Ativan HS.          Meds ordered this encounter  Medications   famotidine (PEPCID) 20 MG tablet    Sig: Take 1 tablet (20 mg total) by mouth 2 (two) times daily.    Dispense:  60 tablet    Refill:  5    Order Specific Question:   Supervising Provider    Answer:   Penni Homans A [[id number]]    I, Nance Pear, NP, personally preformed the services described in this  documentation.  All medical record entries made by the scribe were at my direction and in my presence.  I have reviewed the chart and discharge instructions (if applicable) and agree that the record reflects my personal performance and is accurate and complete. 05/07/2022   I,Amber Collins,acting as a Education administrator for Nance Pear, NP.,have documented all relevant documentation on the behalf of Nance Pear, NP,as directed by  Nance Pear, NP while in the presence of Nance Pear, NP.  Nance Pear, NP

## 2022-05-07 NOTE — Assessment & Plan Note (Signed)
Suspect due to overtreatment of BP.

## 2022-05-07 NOTE — Patient Instructions (Addendum)
Stop Avapro. Please schedule a follow up visit with Dr. Harriet Masson.

## 2022-05-07 NOTE — Assessment & Plan Note (Signed)
Sounds like she may be having some associated visual auras with these HA's so they may represent migraines. She used to have severe migraines when she was younger. Discussed possibility of referral to neurology but she declines at this time. We discussed use of PRN tylenol for HA.

## 2022-05-07 NOTE — Assessment & Plan Note (Signed)
Overtreated.  Suspect this is contributing to her dizziness. Will stop iresartan.

## 2022-05-11 ENCOUNTER — Other Ambulatory Visit: Payer: Self-pay | Admitting: Family

## 2022-05-21 ENCOUNTER — Ambulatory Visit (INDEPENDENT_AMBULATORY_CARE_PROVIDER_SITE_OTHER): Payer: PPO | Admitting: Family

## 2022-05-21 VITALS — BP 124/64 | HR 97 | Temp 98.4°F | Resp 16 | Wt 166.0 lb

## 2022-05-21 DIAGNOSIS — R053 Chronic cough: Secondary | ICD-10-CM | POA: Diagnosis not present

## 2022-05-21 DIAGNOSIS — R059 Cough, unspecified: Secondary | ICD-10-CM | POA: Insufficient documentation

## 2022-05-21 DIAGNOSIS — G8929 Other chronic pain: Secondary | ICD-10-CM | POA: Diagnosis not present

## 2022-05-21 DIAGNOSIS — R519 Headache, unspecified: Secondary | ICD-10-CM

## 2022-05-21 DIAGNOSIS — I10 Essential (primary) hypertension: Secondary | ICD-10-CM | POA: Diagnosis not present

## 2022-05-21 NOTE — Assessment & Plan Note (Signed)
Feels that this is associated with post nasal drip. Recommended trial of claritin.

## 2022-05-21 NOTE — Progress Notes (Signed)
Subjective:   By signing my name below, I, Carla Chandler, attest that this documentation has been prepared under the direction and in the presence of Karie Chimera, NP 05/21/2022    Patient ID: Carla Chandler, female    DOB: 1926-06-05, 88 y.o.   MRN: [id number]  Chief Complaint  Patient presents with   Hypertension    Here for follow up    HPI Patient is in today for an office visit. She is accompanied by a family member.  Dizziness: Since holding her Irbesartan she states some of her dizziness has improved. However, she is still feeling "wobbly." Also she notes that her imbalance is attributable to her eyesight as well. She endorses glaucoma. She also reports having one remote fall. Headaches: She complains of frequent headaches that she describes as feeling "like a toothache". In 2020 she had a brain MRI at [facility].  Cough: She also complains of frequent coughing, ongoing for years. Occasionally she produces phlegm. She believes her cough is related to drainage. Blood Pressure:  At home she has seen readings as low as 884 systolic; however in the past 2 weeks her blood pressure has been gradually increasing. In clinic today her blood pressure is elevated at 144/69, but improved to the 166'A systolic on recheck. BP Readings from Last 3 Encounters:  05/21/22 124/64  05/07/22 109/64  03/18/22 121/71   Pulse Readings from Last 3 Encounters:  05/21/22 97  05/07/22 67  03/18/22 (!) 106    Exercise: For exercise she routinely walks around her house using her walker for assistance. It is sometimes difficult for her to walk due to her instability and knee issues. They use a travel wheelchair for her when going on trips.     Health Maintenance Due  Topic Date Due   TETANUS/TDAP  Never done   Zoster Vaccines- Shingrix (1 of 2) Never done   DEXA SCAN  Never done    Past Medical History:  Diagnosis Date   Accelerated hypertension 12/04/2015   AF (paroxysmal  atrial fibrillation) (HCC) 06/12/2020   Anxiety    Bilateral impacted cerumen 02/12/2018   Breast cancer ([place])    right   CN VI palsy, left 03/13/2019   Cyst of joint of shoulder 12/20/2017   Dizziness 11/12/2020   DLBCL (diffuse large B cell lymphoma) ([place]) 02/29/2016   Drusen of macula of both eyes 02/29/2016   Dry eyes, bilateral 02/29/2016   Dyslipidemia, goal LDL below 130 10/26/2016   Dysphagia 05/25/2015   Dyspnea 05/25/2015   Essential hypertension 05/27/2015   Excessive cerumen in ear canal, bilateral 04/26/2017   Frequent PAC (premature atrial contraction) 01/09/2021   Gastroesophageal reflux disease 06/12/2020   Glaucoma 06/12/2020   History of B-cell lymphoma 10/26/2016   History of breast cancer 06/12/2020   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 2013 rt radiation with balloon   Hyperlipidemia    Hypertension    Hyponatremia 05/25/2015   Formatting of this note might be different from the original. Mild and likely 2ry to diuretic use   Idiopathic peripheral neuropathy 10/26/2016   Insomnia 06/12/2020   Knee effusion, right 01/22/2016   Laryngeal edema 05/25/2015   Lymphoma ([place]) 05/05/2015   Microcalcification of left breast on mammogram 09/27/2017   Murmur, cardiac 07/11/2020   Osteopenia of left thigh 10/26/2016   Other visual disturbances 12/04/2015   PAF (paroxysmal atrial  fibrillation) ([place]) 07/11/2020   PAT (paroxysmal atrial tachycardia) ([place]) 01/09/2021   Poor balance 06/12/2020   Primary hypertension 11/12/2020   Primary open angle glaucoma of left eye, moderate stage 02/29/2016   Primary osteoarthritis of both knees 01/22/2016   Primary osteoarthritis of both shoulders 04/12/2017   Primary osteoarthritis of left knee 01/11/2017   Pseudophakia of both eyes 02/29/2016   PVD (posterior vitreous detachment), both eyes 02/29/2016   Radiation injury 05/27/2015   Right rotator cuff tear arthropathy  01/03/2018   Sensorineural hearing loss (SNHL) of both ears 04/26/2017   Shortness of breath 02/13/2021   Varicose veins of left lower extremity 06/11/2020    Past Surgical History:  Procedure Laterality Date   BLADDER SUSPENSION     BREAST SURGERY Right 2008   lumpectomy   LAPAROSCOPIC HYSTERECTOMY     LYMPHADENECTOMY Right 2016    Family History  Problem Relation Age of Onset   Heart attack Father    Lymphoma Sister    Aneurysm Brother    Cancer Sister    Stroke Sister     Social History   Socioeconomic History   Marital status: Widowed    Spouse name: Not on file   Number of children: Not on file   Years of education: Not on file   Highest education level: Not on file  Occupational History   Occupation: Retired  Tobacco Use   Smoking status: Former   Smokeless tobacco: Never  Scientific laboratory technician Use: Never used  Substance and Sexual Activity   Alcohol use: Yes    Alcohol/week: 3.0 standard drinks of alcohol    Types: 3 Glasses of wine per week    Comment: wine 3 x per week   Drug use: Never   Sexual activity: Not Currently  Other Topics Concern   Not on file  Social History Narrative   Grew up in [place], moved to [place] in her 20's   Retired Editor, 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   Husband died when she was in her 48's   Son and 2 daughters- all local   Has 5 grandchildren and 3 great grandchildren   Lives by herself   No pets   Used to enjoy golf   Enjoys golf   Social Determinants of Health   Financial Resource Strain: Low Risk  (01/29/2022)   Overall Financial Resource Strain (CARDIA)    Difficulty of Paying Living Expenses: Not hard at all  Food Insecurity: No Food Insecurity (01/29/2022)   Hunger Vital Sign    Worried About Running Out of Food in the Last Year: Never true    [place] in the Last Year: Never true  Transportation Needs: No Transportation Needs (01/29/2022)   PRAPARE - Hydrologist  (Medical): No    Lack of Transportation (Non-Medical): No  Physical Activity: Sufficiently Active (01/29/2022)   Exercise Vital Sign    Days of Exercise per Week: 7 days    Minutes of Exercise per Session: 60 min  Stress: No Stress Concern Present (01/29/2022)   [place]    Feeling of Stress : Not at all  Social Connections: Moderately Isolated (01/29/2022)   Social Connection and Isolation Panel [NHANES]    Frequency of Communication with Friends and Family: More than three times a week    Frequency of Social Gatherings with Friends and Family: Three times a week  Attends Religious Services: More than 4 times per year    Active Member of Clubs or Organizations: No    Attends Archivist Meetings: Never    Marital Status: Widowed  Intimate Partner Violence: Not At Risk (01/29/2022)   Humiliation, Afraid, Rape, and Kick questionnaire    Fear of Current or Ex-Partner: No    Emotionally Abused: No    Physically Abused: No    Sexually Abused: No    Outpatient Medications Prior to Visit  Medication Sig Dispense Refill   b complex vitamins capsule Take 1 capsule by mouth daily.     citalopram (CELEXA) 20 MG tablet Take 1 tablet (20 mg total) by mouth daily. 90 tablet 0   clobetasol cream (TEMOVATE) 0.05 %      cloNIDine (CATAPRES) 0.1 MG tablet TAKE 1 TABLET BY MOUTH AT BEDTIME 90 tablet 3   famotidine (PEPCID) 20 MG tablet Take 1 tablet (20 mg total) by mouth 2 (two) times daily. 60 tablet 5   Guselkumab (TREMFYA [place]) Inject into the skin.     LORazepam (ATIVAN) 1 MG tablet TAKE 1 TABLET BY MOUTH AT BEDTIME 90 tablet 0   OVER THE COUNTER MEDICATION Once PRN. Preservative Free Moisture Eye drops.     ROCKLATAN 0.02-0.005 % SOLN Apply to eye.     verapamil (CALAN-SR) 180 MG CR tablet Take 1 tablet (180 mg total) by mouth at bedtime. 90 tablet 3   No facility-administered medications prior to visit.     Allergies  Allergen Reactions   Bimatoprost Other (See Comments)    toxicity   Levofloxacin Diarrhea   Penicillins Itching   Shellfish Allergy Other (See Comments)   Statins Other (See Comments)    Myalgias - "all statins"  Per pt   Atorvastatin Other (See Comments)   Brimonidine Other (See Comments)   Dorzolamide Hcl-Timolol Mal Other (See Comments)   Lovastatin Other (See Comments)   Perindopril Other (See Comments)    Review of Systems  Respiratory:  Positive for cough.   Neurological:  Positive for dizziness and headaches.       Objective:    Physical Exam Constitutional:      General: She is not in acute distress.    Appearance: Normal appearance. She is not ill-appearing.  HENT:     Head: Normocephalic and atraumatic.     Right Ear: External ear normal.     Left Ear: External ear normal.  Eyes:     Extraocular Movements: Extraocular movements intact.     Pupils: Pupils are equal, round, and reactive to light.  Cardiovascular:     Rate and Rhythm: Normal rate and regular rhythm.     Heart sounds: Normal heart sounds. No murmur heard.    No gallop.  Pulmonary:     Effort: Pulmonary effort is normal. No respiratory distress.     Breath sounds: Normal breath sounds. No wheezing or rales.  Skin:    General: Skin is warm and dry.  Neurological:     Mental Status: She is alert and oriented to person, place, and time.  Psychiatric:        Mood and Affect: Mood normal.        Behavior: Behavior normal.        Judgment: Judgment normal.     BP 124/64 (BP Location: Left Arm, Patient Position: Sitting, Cuff Size: Small)   Pulse 97   Temp 98.4 F (36.9 C) (Oral)   Resp 16   Wt  166 lb (75.3 kg)   SpO2 96%   BMI 27.62 kg/m  Wt Readings from Last 3 Encounters:  05/21/22 166 lb (75.3 kg)  05/07/22 167 lb (75.8 kg)  03/18/22 167 lb 3.2 oz (75.8 kg)       Assessment & Plan:   Problem List Items Addressed This Visit       Unprioritized    Nonintractable headache - Primary    Some improvement. We discussed possibility of brain imaging to rule out previous CVA/brain mass. She declines as she would not plan to pursue any treatment for these issues if discovered.       Essential hypertension    BP Readings from Last 3 Encounters:  05/21/22 124/64  05/07/22 109/64  03/18/22 121/71  Last visit bp was overtreated and pt was symptomatic with dizziness. Dizziness has improved some.  Advised pt to remain off of irbesartan.       Chronic cough    Feels that this is associated with post nasal drip. Recommended trial of claritin.        30 minutes spent on today's visit. Time was spent interviewing patient/counseling her on medical options, reviewing medical record.   No orders of the defined types were placed in this encounter.   I, Nance Pear, NP, personally preformed the services described in this documentation.  All medical record entries made by the scribe were at my direction and in my presence.  I have reviewed the chart and discharge instructions (if applicable) and agree that the record reflects my personal performance and is accurate and complete. 05/21/2022   I,Amber Collins,acting as a scribe for Nance Pear, NP.,have documented all relevant documentation on the behalf of Nance Pear, NP,as directed by  Nance Pear, NP while in the presence of Nance Pear, NP.   Nance Pear, NP

## 2022-05-21 NOTE — Assessment & Plan Note (Signed)
Some improvement. We discussed possibility of brain imaging to rule out previous CVA/brain mass. She declines as she would not plan to pursue any treatment for these issues if discovered.

## 2022-05-21 NOTE — Assessment & Plan Note (Signed)
BP Readings from Last 3 Encounters:  05/21/22 124/64  05/07/22 109/64  03/18/22 121/71   Last visit bp was overtreated and pt was symptomatic with dizziness. Dizziness has improved some.  Advised pt to remain off of irbesartan.

## 2022-06-13 ENCOUNTER — Other Ambulatory Visit: Payer: Self-pay | Admitting: Family

## 2022-06-14 NOTE — Telephone Encounter (Signed)
Requesting: lorazepam '1mg'$   Contract: 06/11/20 UDS: 11/06/21 Last Visit: 05/21/22 Next Visit: 08/20/22 Last Refill: 03/16/22 #90 and 0RF  Please Advise

## 2022-07-15 ENCOUNTER — Telehealth: Payer: Self-pay

## 2022-07-15 NOTE — Telephone Encounter (Signed)
Patient's daughter call to advised patient is having some cold symptoms. She reports patient is "well and not having too many symptoms. There is possible she may have covid since patient's son in law tested positive".  She was advised to get patient tested asap and to call back for a virtual visit.

## 2022-07-16 ENCOUNTER — Encounter: Payer: Self-pay | Admitting: Family Medicine

## 2022-07-16 ENCOUNTER — Telehealth (INDEPENDENT_AMBULATORY_CARE_PROVIDER_SITE_OTHER): Payer: PPO | Admitting: Family Medicine

## 2022-07-16 VITALS — BP 130/60 | HR 102 | Temp 98.3°F | Wt 160.0 lb

## 2022-07-16 DIAGNOSIS — U071 COVID-19: Secondary | ICD-10-CM | POA: Diagnosis not present

## 2022-07-16 MED ORDER — NIRMATRELVIR/RITONAVIR (PAXLOVID)TABLET: 3.0000 | ORAL_TABLET | Freq: Two times a day (BID) | ORAL | 0 refills | Status: AC

## 2022-07-16 NOTE — Progress Notes (Signed)
[facility] [address] [place] [address] Dept: [phone] Dept Fax: [phone]  Virtual Video Visit  I connected with [name] on 07/16/22 at  1:20 PM EDT by a video enabled telemedicine application and verified that I am speaking with the correct person using two identifiers.  Location patient: Home Location provider: Clinic Persons participating in the virtual visit: Patient, her daughter, and Provider  I discussed the limitations of evaluation and management by telemedicine and the availability of in person appointments. The patient expressed understanding and agreed to proceed.  Chief Complaint  Patient presents with   Acute Visit    C/o having sneezing, fatigue, body aches x 3-4 days.  Positive covid test on 07/13/22.  Has taken musinex Severe Congestion/cough.     SUBJECTIVE:  HPI: Carla Chandler is a 88 y.o. female who presents with a 3-day history of lightheadedness, malaise, sneezing, and cough. She has had some low grade fever. She notes the last times she was out of her home was on Sunday. She did do a home COVID test yesterday, which was positive. She is currently taking Mucinex and feels this is helping to break up her cough somewhat. She has been prone to respiratory issues in the past.  Patient Active Problem List   Diagnosis Date Noted   Chronic cough 05/21/2022   Nonintractable headache 05/07/2022   Atrial tachycardia ([place]) 06/22/2021   Fatigue 06/22/2021   Other fatigue 06/22/2021   Allergic rhinitis 04/10/2021   Skin lesion 04/10/2021   Osteoarthritis of right knee 04/10/2021   Shortness of breath 02/13/2021   Frequent PAC (premature atrial contraction) 01/09/2021   PAT (paroxysmal atrial tachycardia) ([place]) 01/09/2021   Dizziness 11/12/2020   Primary hypertension 11/12/2020   Hyperlipidemia    Breast cancer ([place])    Anxiety    Hypertension    PAF (paroxysmal atrial  fibrillation) ([place]) 07/11/2020   Murmur, cardiac 07/11/2020   AF (paroxysmal atrial fibrillation) ([place]) 06/12/2020   Insomnia 06/12/2020   Poor balance 06/12/2020   Gastroesophageal reflux disease 06/12/2020   Glaucoma 06/12/2020   History of breast cancer 06/12/2020   Varicose veins of left lower extremity 06/11/2020   CN VI palsy, left 03/13/2019   Right rotator cuff tear arthropathy 01/03/2018   Cyst of joint of shoulder 12/20/2017   Microcalcification of left breast on mammogram 09/27/2017   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 ([place]) 08/17/2017   Excessive cerumen in ear canal, bilateral 04/26/2017   Sensorineural hearing loss (SNHL) of both ears 04/26/2017   Primary osteoarthritis of both shoulders 04/12/2017   Primary osteoarthritis of left knee 01/11/2017   History of B-cell lymphoma 10/26/2016   Idiopathic peripheral neuropathy 10/26/2016   Osteopenia of left thigh 10/26/2016   Dyslipidemia, goal LDL below 130 10/26/2016   DLBCL (diffuse large B cell lymphoma) ([place]) 02/29/2016   Dry eyes, bilateral 02/29/2016   Primary open angle glaucoma of left eye, moderate stage 02/29/2016   Pseudophakia of both eyes 02/29/2016   PVD (posterior vitreous detachment), both eyes 02/29/2016   Drusen of macula of both eyes 02/29/2016   Knee effusion, right 01/22/2016   Primary osteoarthritis of both knees 01/22/2016   Accelerated hypertension 12/04/2015   Other visual disturbances 12/04/2015   Essential hypertension 05/27/2015   Radiation injury 05/27/2015   Dysphagia 05/25/2015   Dyspnea 05/25/2015   Hyponatremia 05/25/2015   Laryngeal edema 05/25/2015   Lymphoma ([place]) 05/05/2015   Past Surgical History:  Procedure Laterality Date   BLADDER SUSPENSION     BREAST SURGERY Right 2008   lumpectomy   LAPAROSCOPIC HYSTERECTOMY     LYMPHADENECTOMY Right 2016   Family History  Problem Relation Age of Onset   Heart attack Father    Lymphoma Sister    Aneurysm  Brother    Cancer Sister    Stroke Sister    Social History   Tobacco Use   Smoking status: Former   Smokeless tobacco: Never  Scientific laboratory technician Use: Never used  Substance Use Topics   Alcohol use: Yes    Alcohol/week: 3.0 standard drinks of alcohol    Types: 3 Glasses of wine per week    Comment: wine 3 x per week   Drug use: Never    Current Outpatient Medications:    b complex vitamins capsule, Take 1 capsule by mouth daily., Disp: , Rfl:    citalopram (CELEXA) 20 MG tablet, Take 1 tablet (20 mg total) by mouth daily., Disp: 90 tablet, Rfl: 0   cloNIDine (CATAPRES) 0.1 MG tablet, TAKE 1 TABLET BY MOUTH AT BEDTIME, Disp: 90 tablet, Rfl: 3   famotidine (PEPCID) 20 MG tablet, Take 1 tablet (20 mg total) by mouth 2 (two) times daily., Disp: 60 tablet, Rfl: 5   Guselkumab (TREMFYA [place]), Inject into the skin., Disp: , Rfl:    LORazepam (ATIVAN) 1 MG tablet, TAKE 1 TABLET BY MOUTH AT BEDTIME, Disp: 90 tablet, Rfl: 0   OVER THE COUNTER MEDICATION, Once PRN. Preservative Free Moisture Eye drops., Disp: , Rfl:    ROCKLATAN 0.02-0.005 % SOLN, Apply to eye., Disp: , Rfl:    verapamil (CALAN-SR) 180 MG CR tablet, Take 1 tablet (180 mg total) by mouth at bedtime., Disp: 90 tablet, Rfl: 3  Allergies  Allergen Reactions   Bimatoprost Other (See Comments)    toxicity   Levofloxacin Diarrhea   Penicillins Itching   Shellfish Allergy Other (See Comments)   Statins Other (See Comments)    Myalgias - "all statins"  Per pt   Atorvastatin Other (See Comments)   Brimonidine Other (See Comments)   Dorzolamide Hcl-Timolol Mal Other (See Comments)   Lovastatin Other (See Comments)   Perindopril Other (See Comments)   ROS: See pertinent positives and negatives per HPI.  OBSERVATIONS/OBJECTIVE:  VITALS per patient if applicable: Today's Vitals   07/16/22 1338  BP: 130/60  Pulse: (!) 102  Temp: 98.3 F (36.8 C)  Weight: 160 lb (72.6 kg)   Body mass index is 26.63 kg/m.   GENERAL:  Alert and oriented. Appears well and in no acute distress.  HEENT: Atraumatic. Eyes clear. No obvious abnormalities on inspection of external nose and ears.  NECK: Normal movements of the head and neck.  LUNGS: On inspection, no signs of respiratory distress. Breathing rate appears normal. No obvious gross SOB, gasping or wheezing, and no conversational dyspnea.  CV: No obvious cyanosis.  PSYCH/NEURO: Pleasant and cooperative. No obvious depression or anxiety. Speech and thought processing grossly intact.  ASSESSMENT AND PLAN:  1. COVID-19 Overall seems ot be tolerating this well. Reviewed home care instructions for COVID. Advised self-isolation at home for at least 5 days. After 5 days, if improved and fever resolved, can be in public, but should wear a mask around others for an additional 5 days. If symptoms, esp, dyspnea develops/worsens, recommend in-person evaluation at either an urgent care or the emergency room.  - nirmatrelvir/ritonavir EUA (PAXLOVID) 20 x 150 MG & 10  x '100MG'$  TABS; Take 3 tablets by mouth 2 (two) times daily for 5 days. (Take nirmatrelvir 150 mg two tablets twice daily for 5 days and ritonavir 100 mg one tablet twice daily for 5 days) Patient GFR is 77  Dispense: 30 tablet; Refill: 0  I discussed the assessment and treatment plan with the patient. The patient was provided an opportunity to ask questions and all were answered. The patient agreed with the plan and demonstrated an understanding of the instructions.   The patient was advised to call back or seek an in-person evaluation if the symptoms worsen or if the condition fails to improve as anticipated.  Return if symptoms worsen or fail to improve.   Haydee Salter, MD

## 2022-08-16 ENCOUNTER — Other Ambulatory Visit: Payer: Self-pay | Admitting: Family

## 2022-08-20 ENCOUNTER — Ambulatory Visit: Payer: PPO | Admitting: Family

## 2022-08-27 ENCOUNTER — Encounter: Payer: Self-pay | Admitting: Family

## 2022-08-27 ENCOUNTER — Ambulatory Visit (INDEPENDENT_AMBULATORY_CARE_PROVIDER_SITE_OTHER): Payer: PPO | Admitting: Family

## 2022-08-27 VITALS — BP 140/80 | HR 65 | Temp 98.1°F | Ht 65.0 in | Wt 168.0 lb

## 2022-08-27 DIAGNOSIS — M159 Polyosteoarthritis, unspecified: Secondary | ICD-10-CM

## 2022-08-27 DIAGNOSIS — I10 Essential (primary) hypertension: Secondary | ICD-10-CM | POA: Diagnosis not present

## 2022-08-27 DIAGNOSIS — F419 Anxiety disorder, unspecified: Secondary | ICD-10-CM

## 2022-08-27 DIAGNOSIS — M1990 Unspecified osteoarthritis, unspecified site: Secondary | ICD-10-CM | POA: Insufficient documentation

## 2022-08-27 DIAGNOSIS — R2689 Other abnormalities of gait and mobility: Secondary | ICD-10-CM

## 2022-08-27 MED ORDER — FAMOTIDINE 20 MG PO TABS: 20.0000 mg | ORAL_TABLET | Freq: Two times a day (BID) | ORAL | 1 refills | Status: DC

## 2022-08-27 MED ORDER — MELOXICAM 7.5 MG PO TABS: 7.5000 mg | ORAL_TABLET | Freq: Every day | ORAL | 3 refills | Status: DC

## 2022-08-27 NOTE — Progress Notes (Signed)
Subjective:   By signing my name below, I, [name], attest that this documentation has been prepared under the direction and in the presence of Karie Chimera, NP 08/27/2022    Patient ID: Carla Chandler, female    DOB: 11-01-1926, 88 y.o.   MRN: [id number]  Chief Complaint  Patient presents with   Follow-up    3 month follow up   Pain    Arms, Back, legs Knees  Balance is off     HPI Patient is in today for an office visit  Refills: Patient is requesting 60 day supply of 20 mg Famotidine instead of a 30 day supply  Musculoskeletal Pain: She is complaining of worsening arm, back, shoulder, knee pain. Patient reports of having a hard time getting around. Tylenol does not help with the pain at all and ibuprofen helps at little but not a lot at night  Gait: She reports of having unstable balance, Hands and feet: She is feeling numbness in her hands and feet.  Sleeping: Reports having difficulty sleeping as pain keeps her up. She switches from the bedroom to the living room at night. Mood: She reports of being in a good mood. Genitourinary: She reports of urinating often- but drinks a lot of water.  Blood pressure: Blood pressure is looking normal this visit BP Readings from Last 3 Encounters:  08/27/22 (!) 140/80  07/16/22 130/60  05/21/22 124/64    Immunization: She is not interested in receiving an influenza vaccine.  Health Maintenance Due  Topic Date Due   TETANUS/TDAP  Never done   Zoster Vaccines- Shingrix (1 of 2) Never done   DEXA SCAN  Never done   COVID-19 Vaccine (5 - Pfizer risk series) 01/01/2022    Past Medical History:  Diagnosis Date   Accelerated hypertension 12/04/2015   AF (paroxysmal atrial fibrillation) (HCC) 06/12/2020   Anxiety    Bilateral impacted cerumen 02/12/2018   Breast cancer ([place])    right   CN VI palsy, left 03/13/2019   Cyst of joint of shoulder 12/20/2017   Dizziness 11/12/2020   DLBCL (diffuse large B cell lymphoma)  ([place]) 02/29/2016   [place] of macula of both eyes 02/29/2016   Dry eyes, bilateral 02/29/2016   Dyslipidemia, goal LDL below 130 10/26/2016   Dysphagia 05/25/2015   Dyspnea 05/25/2015   Essential hypertension 05/27/2015   Excessive cerumen in ear canal, bilateral 04/26/2017   Frequent PAC (premature atrial contraction) 01/09/2021   Gastroesophageal reflux disease 06/12/2020   Glaucoma 06/12/2020   History of B-cell lymphoma 10/26/2016   History of breast cancer 06/12/2020   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 2013 rt radiation with balloon   Hyperlipidemia    Hypertension    Hyponatremia 05/25/2015   Formatting of this note might be different from the original. Mild and likely 2ry to diuretic use   Idiopathic peripheral neuropathy 10/26/2016   Insomnia 06/12/2020   Knee effusion, right 01/22/2016   Laryngeal edema 05/25/2015   Lymphoma ([place]) 05/05/2015   Microcalcification of left breast on mammogram 09/27/2017   Murmur, cardiac 07/11/2020   Osteopenia of left thigh 10/26/2016   Other visual disturbances 12/04/2015   PAF (paroxysmal atrial fibrillation) ([place]) 07/11/2020   PAT (paroxysmal atrial tachycardia) 01/09/2021   Poor balance 06/12/2020   Primary hypertension 11/12/2020   Primary open angle glaucoma of left eye, moderate stage 02/29/2016  Primary osteoarthritis of both knees 01/22/2016   Primary osteoarthritis of both shoulders 04/12/2017   Primary osteoarthritis of left knee 01/11/2017   Pseudophakia of both eyes 02/29/2016   PVD (posterior vitreous detachment), both eyes 02/29/2016   Radiation injury 05/27/2015   Right rotator cuff tear arthropathy 01/03/2018   Sensorineural hearing loss (SNHL) of both ears 04/26/2017   Shortness of breath 02/13/2021   Varicose veins of left lower extremity 06/11/2020    Past Surgical History:  Procedure Laterality Date   BLADDER SUSPENSION     BREAST SURGERY Right 2008    lumpectomy   LAPAROSCOPIC HYSTERECTOMY     LYMPHADENECTOMY Right 2016    Family History  Problem Relation Age of Onset   Heart attack Father    Lymphoma Sister    Aneurysm Brother    Cancer Sister    Stroke Sister     Social History   Socioeconomic History   Marital status: Widowed    Spouse name: Not on file   Number of children: Not on file   Years of education: Not on file   Highest education level: Not on file  Occupational History   Occupation: Retired  Tobacco Use   Smoking status: Former   Smokeless tobacco: Never  Scientific laboratory technician Use: Never used  Substance and Sexual Activity   Alcohol use: Yes    Alcohol/week: 3.0 standard drinks of alcohol    Types: 3 Glasses of wine per week    Comment: wine 3 x per week   Drug use: Never   Sexual activity: Not Currently  Other Topics Concern   Not on file  Social History Narrative   Grew up in [place], moved to [place] in her 20's   Retired Editor, 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   Husband died when she was in her 33's   Son and 2 daughters- all local   Has 5 grandchildren and 3 great grandchildren   Lives by herself   No pets   Used to enjoy golf   Enjoys golf   Social Determinants of Health   Financial Resource Strain: Low Risk  (01/29/2022)   Overall Financial Resource Strain (CARDIA)    Difficulty of Paying Living Expenses: Not hard at all  Food Insecurity: No Food Insecurity (01/29/2022)   Hunger Vital Sign    Worried About Running Out of Food in the Last Year: Never true    [place] in the Last Year: Never true  Transportation Needs: No Transportation Needs (01/29/2022)   PRAPARE - Hydrologist (Medical): No    Lack of Transportation (Non-Medical): No  Physical Activity: Sufficiently Active (01/29/2022)   Exercise Vital Sign    Days of Exercise per Week: 7 days    Minutes of Exercise per Session: 60 min  Stress: No Stress Concern Present (01/29/2022)    [place]    Feeling of Stress : Not at all  Social Connections: Moderately Isolated (01/29/2022)   Social Connection and Isolation Panel [NHANES]    Frequency of Communication with Friends and Family: More than three times a week    Frequency of Social Gatherings with Friends and Family: Three times a week    Attends Religious Services: More than 4 times per year    Active Member of Clubs or Organizations: No    Attends Archivist Meetings: Never    Marital  Status: Widowed  Intimate Partner Violence: Not At Risk (01/29/2022)   Humiliation, Afraid, Rape, and Kick questionnaire    Fear of Current or Ex-Partner: No    Emotionally Abused: No    Physically Abused: No    Sexually Abused: No    Outpatient Medications Prior to Visit  Medication Sig Dispense Refill   b complex vitamins capsule Take 1 capsule by mouth daily.     citalopram (CELEXA) 20 MG tablet Take 1 tablet by mouth once daily 90 tablet 0   cloNIDine (CATAPRES) 0.1 MG tablet TAKE 1 TABLET BY MOUTH AT BEDTIME 90 tablet 3   Guselkumab (TREMFYA [place]) Inject into the skin.     LORazepam (ATIVAN) 1 MG tablet TAKE 1 TABLET BY MOUTH AT BEDTIME 90 tablet 0   OVER THE COUNTER MEDICATION Once PRN. Preservative Free Moisture Eye drops.     ROCKLATAN 0.02-0.005 % SOLN Apply to eye.     verapamil (CALAN-SR) 180 MG CR tablet Take 1 tablet (180 mg total) by mouth at bedtime. 90 tablet 3   famotidine (PEPCID) 20 MG tablet Take 1 tablet (20 mg total) by mouth 2 (two) times daily. 60 tablet 5   No facility-administered medications prior to visit.    Allergies  Allergen Reactions   Bimatoprost Other (See Comments)    toxicity   Levofloxacin Diarrhea   Penicillins Itching   Shellfish Allergy Other (See Comments)   Statins Other (See Comments)    Myalgias - "all statins"  Per pt   Atorvastatin Other (See Comments)   Brimonidine Other (See Comments)    Dorzolamide Hcl-Timolol Mal Other (See Comments)   Lovastatin Other (See Comments)   Perindopril Other (See Comments)    Review of Systems  Musculoskeletal:  Positive for back pain.       (+) Knee pain (+) Arm pain (+) Shoulder pain       Objective:    Physical Exam Constitutional:      General: She is not in acute distress.    Appearance: Normal appearance. She is not ill-appearing.  HENT:     Head: Normocephalic and atraumatic.     Right Ear: External ear normal.     Left Ear: External ear normal.  Eyes:     Extraocular Movements: Extraocular movements intact.     Pupils: Pupils are equal, round, and reactive to light.  Cardiovascular:     Rate and Rhythm: Normal rate and regular rhythm.     Heart sounds: Normal heart sounds. No murmur heard.    No gallop.  Pulmonary:     Effort: Pulmonary effort is normal. No respiratory distress.     Breath sounds: Normal breath sounds. No wheezing or rales.  Musculoskeletal:     Comments: Fusion (Right Shoulder)   Skin:    General: Skin is warm and dry.  Neurological:     Mental Status: She is alert and oriented to person, place, and time.  Psychiatric:        Mood and Affect: Mood normal.        Behavior: Behavior normal.        Judgment: Judgment normal.     BP (!) 140/80   Pulse 65   Temp 98.1 F (36.7 C) (Oral)   Ht '5\' 5"'$  (1.651 m)   Wt 168 lb (76.2 kg)   SpO2 98%   BMI 27.96 kg/m  Wt Readings from Last 3 Encounters:  08/27/22 168 lb (76.2 kg)  07/16/22 160 lb (72.6 kg)  05/21/22 166 lb (75.3 kg)       Assessment & Plan:   Problem List Items Addressed This Visit       Unprioritized   Primary hypertension    BP Readings from Last 3 Encounters:  08/27/22 (!) 140/80  07/16/22 130/60  05/21/22 124/64        Poor balance - Primary    Uncontrolled.  Will refer for home health PT. She ambulates with a walker at home.       Relevant Orders   Ambulatory referral to Home Health   Osteoarthritis     Uncontrolled pain.  I would like to avoid narcotic meds due to her age and the fact that she is on a benzo. Will give trial of meloxicam 7.'5mg'$  once daily along with tylenol 1000 mg twice daily. We discussed risks of NSAIDS including renal stress and GI bleed- she understands this and wishes to proceed.       Relevant Medications   meloxicam (MOBIC) 7.5 MG tablet   Anxiety    Stable on citalopram. Continue same.       Encouraged her to get the flu shot and covid shot at her pharmacy.  Meds ordered this encounter  Medications   meloxicam (MOBIC) 7.5 MG tablet    Sig: Take 1 tablet (7.5 mg total) by mouth daily.    Dispense:  30 tablet    Refill:  3    Order Specific Question:   Supervising Provider    Answer:   Penni Homans A [[id number]]   famotidine (PEPCID) 20 MG tablet    Sig: Take 1 tablet (20 mg total) by mouth 2 (two) times daily.    Dispense:  180 tablet    Refill:  1    Order Specific Question:   Supervising Provider    Answer:   Penni Homans A [[id number]]    I, Nance Pear, NP, personally preformed the services described in this documentation.  All medical record entries made by the scribe were at my direction and in my presence.  I have reviewed the chart and discharge instructions (if applicable) and agree that the record reflects my personal performance and is accurate and complete. 08/27/2022   I,Amber Collins,acting as a scribe for Nance Pear, NP.,have documented all relevant documentation on the behalf of Nance Pear, NP,as directed by  Nance Pear, NP while in the presence of Nance Pear, NP.      Nance Pear, NP

## 2022-08-27 NOTE — Assessment & Plan Note (Addendum)
Uncontrolled pain.  I would like to avoid narcotic meds due to her age and the fact that she is on a benzo. Will give trial of meloxicam 7.'5mg'$  once daily along with tylenol 1000 mg twice daily. We discussed risks of NSAIDS including renal stress and GI bleed- she understands this and wishes to proceed.

## 2022-08-27 NOTE — Assessment & Plan Note (Signed)
>>  ASSESSMENT AND PLAN FOR PRIMARY HYPERTENSION WRITTEN ON 08/27/2022  9:20 AM BY O'SULLIVAN, Lynzi Meulemans, NP  BP Readings from Last 3 Encounters:  08/27/22 (!) 140/80  07/16/22 130/60  05/21/22 124/64

## 2022-08-27 NOTE — Assessment & Plan Note (Signed)
BP Readings from Last 3 Encounters:  08/27/22 (!) 140/80  07/16/22 130/60  05/21/22 124/64

## 2022-08-27 NOTE — Assessment & Plan Note (Signed)
Stable on citalopram. Continue same.

## 2022-08-27 NOTE — Patient Instructions (Signed)
Take meloxicam once daily in the AM along with '1000mg'$  of tylenol.  Take '1000mg'$  of tylenol in the evening.

## 2022-08-27 NOTE — Assessment & Plan Note (Signed)
Uncontrolled.  Will refer for home health PT. She ambulates with a walker at home.

## 2022-09-02 ENCOUNTER — Telehealth: Payer: Self-pay | Admitting: Family

## 2022-09-02 DIAGNOSIS — M1712 Unilateral primary osteoarthritis, left knee: Secondary | ICD-10-CM | POA: Diagnosis not present

## 2022-09-02 DIAGNOSIS — I480 Paroxysmal atrial fibrillation: Secondary | ICD-10-CM | POA: Diagnosis not present

## 2022-09-02 DIAGNOSIS — I491 Atrial premature depolarization: Secondary | ICD-10-CM | POA: Diagnosis not present

## 2022-09-02 DIAGNOSIS — R52 Pain, unspecified: Secondary | ICD-10-CM | POA: Diagnosis not present

## 2022-09-02 DIAGNOSIS — M1711 Unilateral primary osteoarthritis, right knee: Secondary | ICD-10-CM | POA: Diagnosis not present

## 2022-09-02 DIAGNOSIS — M19011 Primary osteoarthritis, right shoulder: Secondary | ICD-10-CM | POA: Diagnosis not present

## 2022-09-02 DIAGNOSIS — I10 Essential (primary) hypertension: Secondary | ICD-10-CM | POA: Diagnosis not present

## 2022-09-02 DIAGNOSIS — G9009 Other idiopathic peripheral autonomic neuropathy: Secondary | ICD-10-CM | POA: Diagnosis not present

## 2022-09-02 DIAGNOSIS — M19012 Primary osteoarthritis, left shoulder: Secondary | ICD-10-CM | POA: Diagnosis not present

## 2022-09-02 DIAGNOSIS — R2681 Unsteadiness on feet: Secondary | ICD-10-CM | POA: Diagnosis not present

## 2022-09-02 NOTE — Telephone Encounter (Signed)
Verbal order given as requested. 

## 2022-09-02 NOTE — Telephone Encounter (Signed)
Caller/Agency: [facility]) Callback Number: [phone] Requesting OT/PT/Skilled Nursing/Social Work/Speech Therapy: PT Frequency: 2 w 4, 1 w 2

## 2022-09-10 ENCOUNTER — Telehealth: Payer: Self-pay | Admitting: Family

## 2022-09-16 ENCOUNTER — Other Ambulatory Visit: Payer: Self-pay

## 2022-09-16 MED ORDER — LORAZEPAM 1 MG PO TABS: 1.0000 mg | ORAL_TABLET | Freq: Every day | ORAL | 0 refills | Status: DC

## 2022-09-16 NOTE — Addendum Note (Signed)
Addended by: [name] on: 09/16/2022 12:01 PM   Modules accepted: Orders

## 2022-09-16 NOTE — Telephone Encounter (Signed)
Can you please cancel ativan rx at [facility]?

## 2022-09-16 NOTE — Telephone Encounter (Signed)
Rx cancelled.

## 2022-09-16 NOTE — Telephone Encounter (Signed)
Patient states Suzie Portela has not been able to fill her Ativan, so she would like for the prescription to be sent to: [facility] at [address] [place]

## 2022-09-17 ENCOUNTER — Other Ambulatory Visit (HOSPITAL_COMMUNITY): Payer: Self-pay

## 2022-09-22 DIAGNOSIS — I491 Atrial premature depolarization: Secondary | ICD-10-CM | POA: Diagnosis not present

## 2022-09-22 DIAGNOSIS — R2681 Unsteadiness on feet: Secondary | ICD-10-CM | POA: Diagnosis not present

## 2022-09-22 DIAGNOSIS — M19011 Primary osteoarthritis, right shoulder: Secondary | ICD-10-CM | POA: Diagnosis not present

## 2022-09-22 DIAGNOSIS — I480 Paroxysmal atrial fibrillation: Secondary | ICD-10-CM | POA: Diagnosis not present

## 2022-09-22 DIAGNOSIS — I10 Essential (primary) hypertension: Secondary | ICD-10-CM | POA: Diagnosis not present

## 2022-09-22 DIAGNOSIS — M1711 Unilateral primary osteoarthritis, right knee: Secondary | ICD-10-CM | POA: Diagnosis not present

## 2022-09-22 DIAGNOSIS — M19012 Primary osteoarthritis, left shoulder: Secondary | ICD-10-CM | POA: Diagnosis not present

## 2022-09-22 DIAGNOSIS — M1712 Unilateral primary osteoarthritis, left knee: Secondary | ICD-10-CM | POA: Diagnosis not present

## 2022-09-22 DIAGNOSIS — R52 Pain, unspecified: Secondary | ICD-10-CM | POA: Diagnosis not present

## 2022-09-22 DIAGNOSIS — G9009 Other idiopathic peripheral autonomic neuropathy: Secondary | ICD-10-CM | POA: Diagnosis not present

## 2022-09-27 DIAGNOSIS — M1712 Unilateral primary osteoarthritis, left knee: Secondary | ICD-10-CM

## 2022-09-27 DIAGNOSIS — G9009 Other idiopathic peripheral autonomic neuropathy: Secondary | ICD-10-CM

## 2022-09-27 DIAGNOSIS — I10 Essential (primary) hypertension: Secondary | ICD-10-CM

## 2022-09-27 DIAGNOSIS — M1711 Unilateral primary osteoarthritis, right knee: Secondary | ICD-10-CM

## 2022-09-27 DIAGNOSIS — Z923 Personal history of irradiation: Secondary | ICD-10-CM

## 2022-09-27 DIAGNOSIS — M19011 Primary osteoarthritis, right shoulder: Secondary | ICD-10-CM

## 2022-09-27 DIAGNOSIS — R52 Pain, unspecified: Secondary | ICD-10-CM

## 2022-09-27 DIAGNOSIS — R2681 Unsteadiness on feet: Secondary | ICD-10-CM

## 2022-09-27 DIAGNOSIS — G4700 Insomnia, unspecified: Secondary | ICD-10-CM

## 2022-09-27 DIAGNOSIS — M19012 Primary osteoarthritis, left shoulder: Secondary | ICD-10-CM

## 2022-09-27 DIAGNOSIS — H409 Unspecified glaucoma: Secondary | ICD-10-CM

## 2022-09-27 DIAGNOSIS — H903 Sensorineural hearing loss, bilateral: Secondary | ICD-10-CM

## 2022-09-27 DIAGNOSIS — C8330 Diffuse large B-cell lymphoma, unspecified site: Secondary | ICD-10-CM

## 2022-09-27 DIAGNOSIS — I491 Atrial premature depolarization: Secondary | ICD-10-CM

## 2022-09-27 DIAGNOSIS — E785 Hyperlipidemia, unspecified: Secondary | ICD-10-CM

## 2022-09-27 DIAGNOSIS — K219 Gastro-esophageal reflux disease without esophagitis: Secondary | ICD-10-CM

## 2022-09-27 DIAGNOSIS — Z853 Personal history of malignant neoplasm of breast: Secondary | ICD-10-CM

## 2022-09-27 DIAGNOSIS — I480 Paroxysmal atrial fibrillation: Secondary | ICD-10-CM

## 2022-09-27 DIAGNOSIS — F419 Anxiety disorder, unspecified: Secondary | ICD-10-CM

## 2022-10-22 DIAGNOSIS — L821 Other seborrheic keratosis: Secondary | ICD-10-CM | POA: Diagnosis not present

## 2022-10-22 DIAGNOSIS — L400 Psoriasis vulgaris: Secondary | ICD-10-CM | POA: Diagnosis not present

## 2022-10-27 IMAGING — DX DG CHEST 2V
2 series · 2 of 2 positions shown · non-contrast
Comparison: 10/21/2020, 09/11/2021

CLINICAL DATA: [AGE] female with a history of cough

EXAM:
CHEST - 2 VIEW

[chest lat]
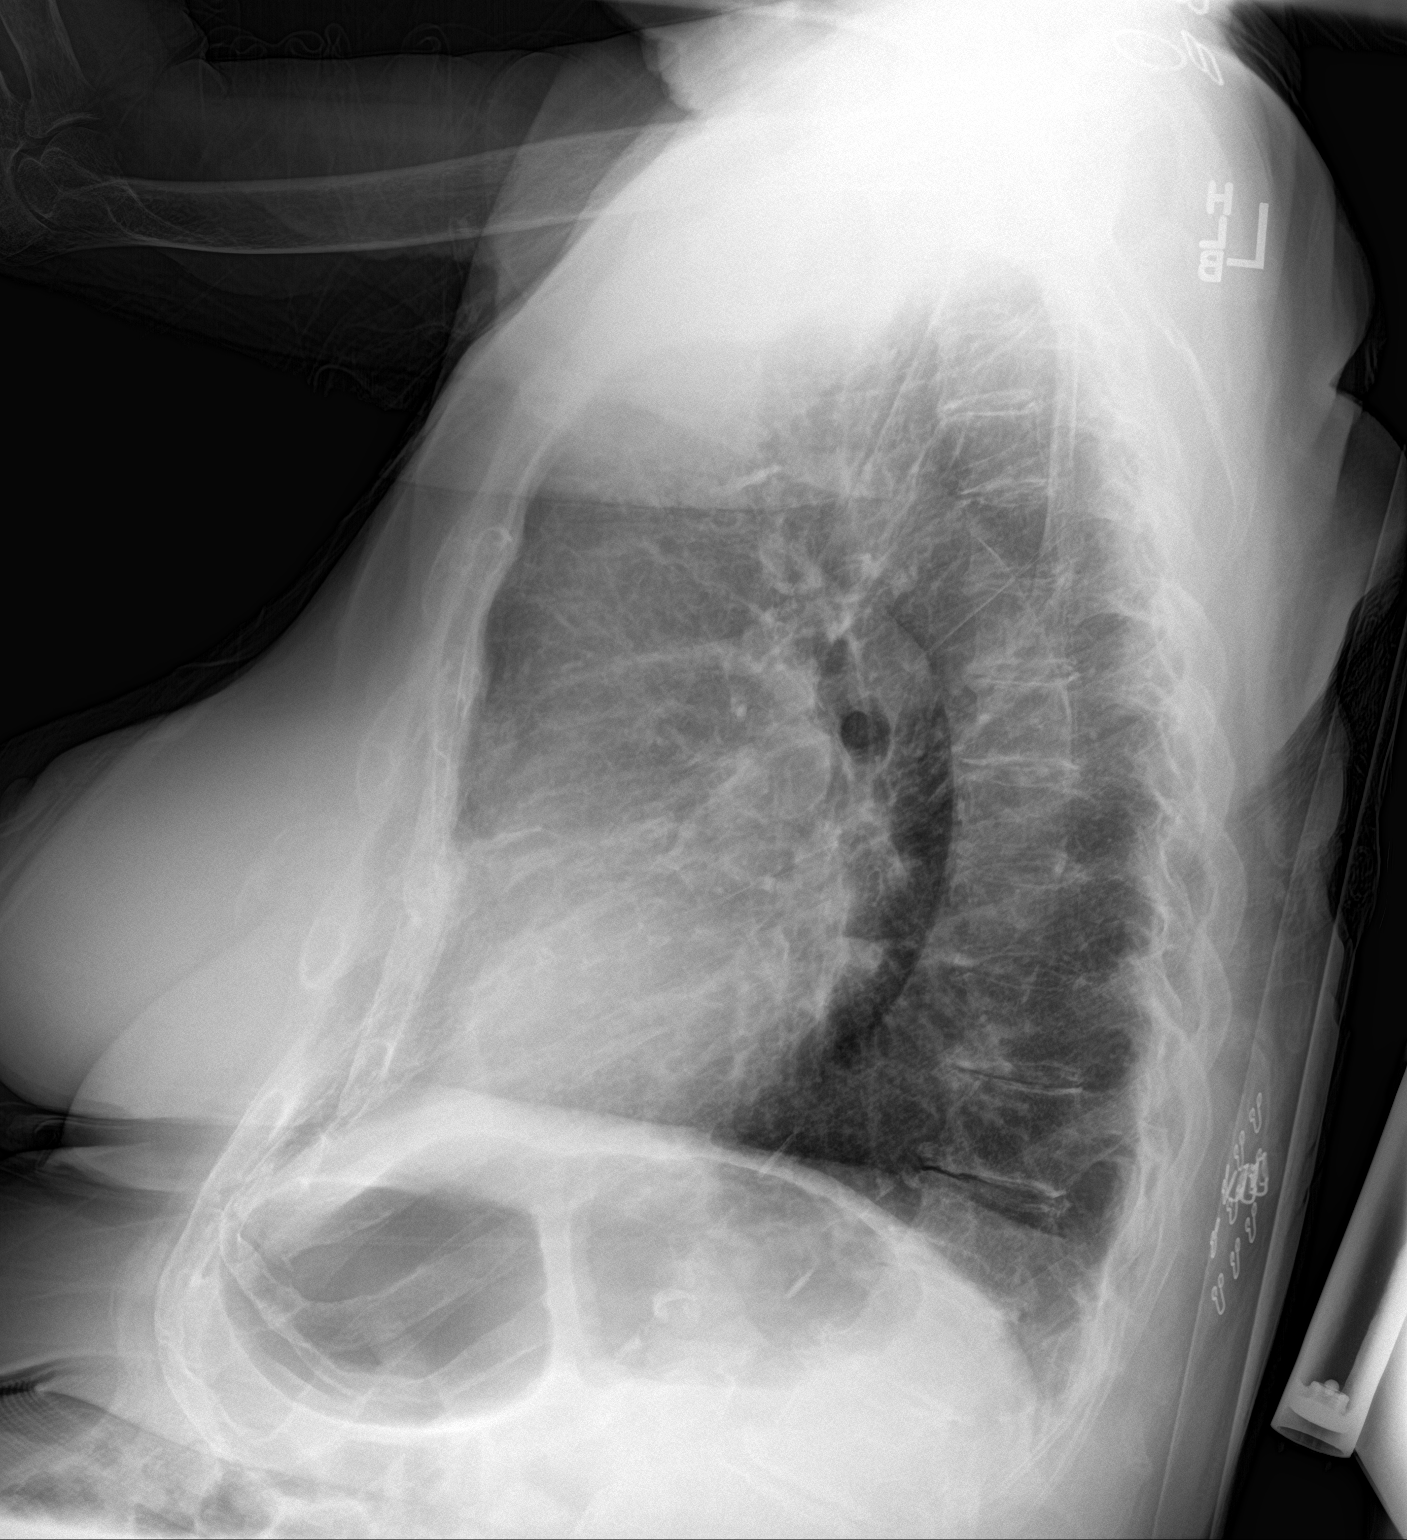

[chest ap strecther]
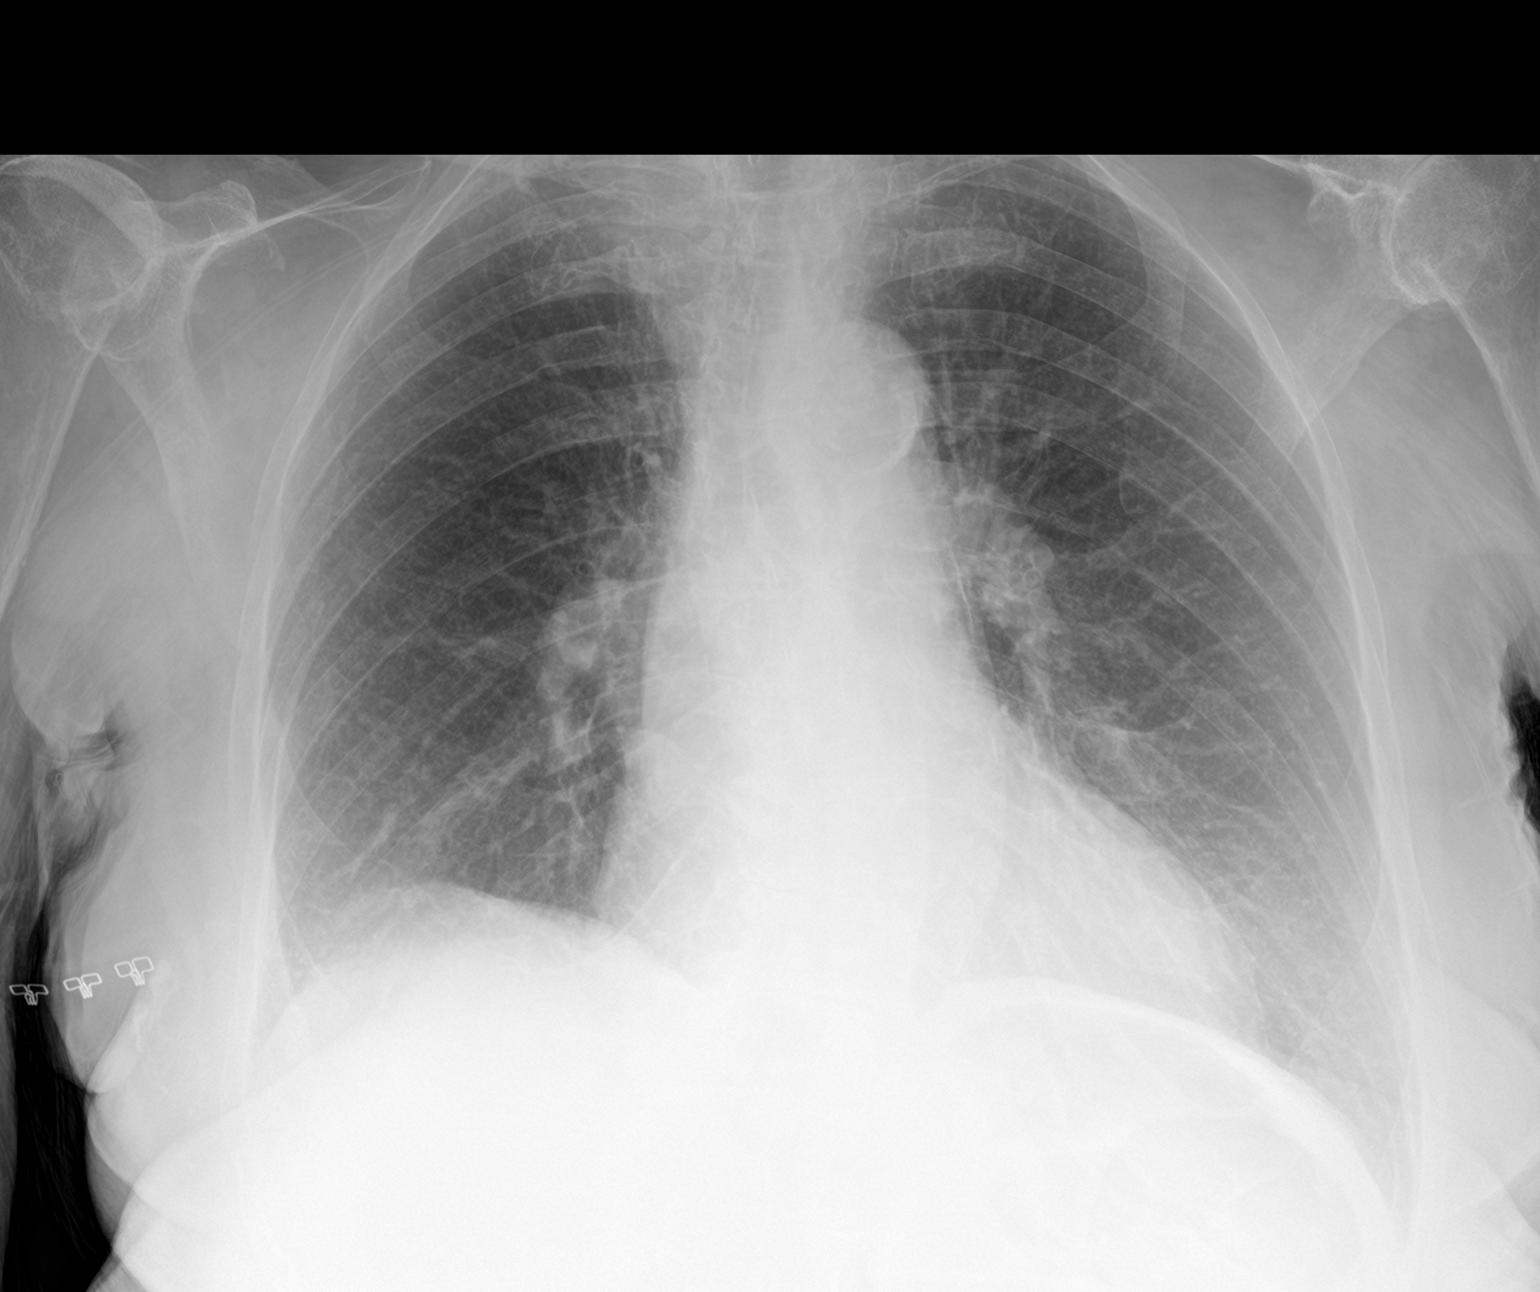

[2 of 2 positions shown; findings below may reference images not displayed]

FINDINGS: Cardiomediastinal silhouette unchanged in size and contour. Aortic
calcifications. No evidence of central vascular congestion. No
interlobular septal thickening.

Similar appearance of asymmetric elevation of the right
hemidiaphragm.

No pneumothorax or pleural effusion. Coarsened interstitial
markings, with no confluent airspace disease.

No acute displaced fracture. Degenerative changes of the spine.
IMPRESSION: Negative for acute cardiopulmonary disease

## 2022-10-29 DIAGNOSIS — Z111 Encounter for screening for respiratory tuberculosis: Secondary | ICD-10-CM | POA: Diagnosis not present

## 2022-10-29 DIAGNOSIS — L400 Psoriasis vulgaris: Secondary | ICD-10-CM | POA: Diagnosis not present

## 2022-11-11 DIAGNOSIS — H532 Diplopia: Secondary | ICD-10-CM | POA: Diagnosis not present

## 2022-11-11 DIAGNOSIS — H401111 Primary open-angle glaucoma, right eye, mild stage: Secondary | ICD-10-CM | POA: Diagnosis not present

## 2022-11-11 DIAGNOSIS — H52203 Unspecified astigmatism, bilateral: Secondary | ICD-10-CM | POA: Diagnosis not present

## 2022-11-11 DIAGNOSIS — H5203 Hypermetropia, bilateral: Secondary | ICD-10-CM | POA: Diagnosis not present

## 2022-11-11 DIAGNOSIS — H524 Presbyopia: Secondary | ICD-10-CM | POA: Diagnosis not present

## 2022-11-11 DIAGNOSIS — H04123 Dry eye syndrome of bilateral lacrimal glands: Secondary | ICD-10-CM | POA: Diagnosis not present

## 2022-11-11 DIAGNOSIS — H401122 Primary open-angle glaucoma, left eye, moderate stage: Secondary | ICD-10-CM | POA: Diagnosis not present

## 2022-11-11 DIAGNOSIS — R519 Headache, unspecified: Secondary | ICD-10-CM | POA: Diagnosis not present

## 2022-11-11 DIAGNOSIS — M316 Other giant cell arteritis: Secondary | ICD-10-CM | POA: Diagnosis not present

## 2022-11-12 ENCOUNTER — Ambulatory Visit: Payer: PPO | Attending: Cardiology | Admitting: Cardiology

## 2022-11-12 ENCOUNTER — Encounter: Payer: Self-pay | Admitting: Cardiology

## 2022-11-12 VITALS — BP 122/66 | HR 86 | Ht 65.0 in | Wt 166.6 lb

## 2022-11-12 DIAGNOSIS — Z79899 Other long term (current) drug therapy: Secondary | ICD-10-CM

## 2022-11-12 DIAGNOSIS — I491 Atrial premature depolarization: Secondary | ICD-10-CM

## 2022-11-12 DIAGNOSIS — I4719 Other supraventricular tachycardia: Secondary | ICD-10-CM | POA: Diagnosis not present

## 2022-11-12 DIAGNOSIS — R079 Chest pain, unspecified: Secondary | ICD-10-CM | POA: Diagnosis not present

## 2022-11-12 LAB — CBC

## 2022-11-12 MED ORDER — VERAPAMIL HCL ER 180 MG PO TBCR: 180.0000 mg | EXTENDED_RELEASE_TABLET | Freq: Every day | ORAL | 3 refills | Status: DC

## 2022-11-12 MED ORDER — CLONIDINE HCL 0.1 MG PO TABS: 0.1000 mg | ORAL_TABLET | Freq: Every day | ORAL | 3 refills | Status: DC

## 2022-11-12 NOTE — Patient Instructions (Signed)
Medication Instructions:  Your physician recommends that you continue on your current medications as directed. Please refer to the Current Medication list given to you today.  *If you need a refill on your cardiac medications before your next appointment, please call your pharmacy*   Lab Work: Your physician recommends that you have the following labs drawn today: BMET, CBC and Magnesium  If you have labs (blood work) drawn today and your tests are completely normal, you will receive your results only by: [place] (if you have MyChart) OR A paper copy in the mail If you have any lab test that is abnormal or we need to change your treatment, we will call you to review the results.   Testing/Procedures: NONE   Follow-Up: At [facility], you and your health needs are our priority.  As part of our continuing mission to provide you with exceptional heart care, we have created designated Provider Care Teams.  These Care Teams include your primary Cardiologist (physician) and Advanced Practice Providers (APPs -  Physician Assistants and Nurse Practitioners) who all work together to provide you with the care you need, when you need it.  We recommend signing up for the patient portal called "MyChart".  Sign up information is provided on this After Visit Summary.  MyChart is used to connect with patients for Virtual Visits (Telemedicine).  Patients are able to view lab/test results, encounter notes, upcoming appointments, etc.  Non-urgent messages can be sent to your provider as well.   To learn more about what you can do with MyChart, go to [email or web address].    Your next appointment:   1 year(s)  The format for your next appointment:   In Person  Provider:   Berniece Salines, DO

## 2022-11-12 NOTE — Progress Notes (Signed)
Cardiology Office Note:    Date:  11/13/2022   ID:  Carla Chandler, DOB 1926/08/03, MRN [id number]  PCP:  [name], NP  Cardiologist:  Berniece Salines, DO  Electrophysiologist:  None   Referring MD: [name], NP   " I am having increasing palpitations  History of Present Illness:    Carla Chandler is a 88 y.o. female with a hx of  asthma, paroxysmal atrial fibrillation which was diagnosed over 20 years ago has not had any recurrent episodes and has been back in sinus rhythm, hyperlipidemia, hypertension. Patient is here today for follow-up visit.  She is here today with her daughter.   I last saw the patient on 10/21/2020 and at that time she was experiencing some shortness of breath and dizziness. Based on her clinical exam, I suspected that her shortness breath may have been pulmonary in nature. We got a chest x-ray and blood work. The chest x-ray was normal. Her blood work showed hyperkalemia.    I saw the patient in  November 12, 2020 at that time she had reported that she was experiencing significant dizziness I stop her hydrochlorothiazide kept the patient on her irbesartan and verapamil.     I saw the patient on January 09, 2021 at that time we discussed her monitor which show frequent PACs while she was on verapamil.  Because of this was stopped verapamil and started the patient on propanolol 10 mg twice a day.  She also was hypotensive so we held her irbesartan.   I saw the patient on 02/13/2021 at that time she had had significant A. fib was unable to do any of her ADLs.  Stopped the propanolol for the patient and we started.  She is not having significant palpitations.  But her blood pressure.  Also experiencing some GI issues and recommended that she see her PCP.   At her visit on March 12, 2021 she still was experiencing elevated blood pressure.  She also was experiencing a lot of stress as she had lost her son and was going to the funeral the next  day.   At her last visit in August 2022 at that time she was doing well since we started the clonidine at nighttime.  We reviewed her echocardiogram.  During that visit she has some fatigue but had declined any sleep study as well as ischemic evaluation  She offers no cardiac complaints at this time.  Past Medical History:  Diagnosis Date   Accelerated hypertension 12/04/2015   AF (paroxysmal atrial fibrillation) ([place]) 06/12/2020   Anxiety    Bilateral impacted cerumen 02/12/2018   Breast cancer ([place])    right   CN VI palsy, left 03/13/2019   Cyst of joint of shoulder 12/20/2017   Dizziness 11/12/2020   DLBCL (diffuse large B cell lymphoma) ([place]) 02/29/2016   [place] of macula of both eyes 02/29/2016   Dry eyes, bilateral 02/29/2016   Dyslipidemia, goal LDL below 130 10/26/2016   Dysphagia 05/25/2015   Dyspnea 05/25/2015   Essential hypertension 05/27/2015   Excessive cerumen in ear canal, bilateral 04/26/2017   Frequent PAC (premature atrial contraction) 01/09/2021   Gastroesophageal reflux disease 06/12/2020   Glaucoma 06/12/2020   History of B-cell lymphoma 10/26/2016   History of breast cancer 06/12/2020   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 2013 rt radiation with balloon   Hyperlipidemia  Hypertension    Hyponatremia 05/25/2015   Formatting of this note might be different from the original. Mild and likely 2ry to diuretic use   Idiopathic peripheral neuropathy 10/26/2016   Insomnia 06/12/2020   Knee effusion, right 01/22/2016   Laryngeal edema 05/25/2015   Lymphoma ([place]) 05/05/2015   Microcalcification of left breast on mammogram 09/27/2017   Murmur, cardiac 07/11/2020   Osteopenia of left thigh 10/26/2016   Other visual disturbances 12/04/2015   PAF (paroxysmal atrial fibrillation) ([place]) 07/11/2020   PAT (paroxysmal atrial tachycardia) 01/09/2021   Poor balance 06/12/2020   Primary  hypertension 11/12/2020   Primary open angle glaucoma of left eye, moderate stage 02/29/2016   Primary osteoarthritis of both knees 01/22/2016   Primary osteoarthritis of both shoulders 04/12/2017   Primary osteoarthritis of left knee 01/11/2017   Pseudophakia of both eyes 02/29/2016   PVD (posterior vitreous detachment), both eyes 02/29/2016   Radiation injury 05/27/2015   Right rotator cuff tear arthropathy 01/03/2018   Sensorineural hearing loss (SNHL) of both ears 04/26/2017   Shortness of breath 02/13/2021   Varicose veins of left lower extremity 06/11/2020    Past Surgical History:  Procedure Laterality Date   BLADDER SUSPENSION     BREAST SURGERY Right 2008   lumpectomy   LAPAROSCOPIC HYSTERECTOMY     LYMPHADENECTOMY Right 2016    Current Medications: Current Meds  Medication Sig   b complex vitamins capsule Take 1 capsule by mouth daily.   citalopram (CELEXA) 20 MG tablet Take 1 tablet by mouth once daily   clobetasol (TEMOVATE) 0.05 % external solution Apply topically 2 (two) times daily as needed.   famotidine (PEPCID) 20 MG tablet Take 1 tablet (20 mg total) by mouth 2 (two) times daily.   Guselkumab (TREMFYA [place]) Inject into the skin.   LORazepam (ATIVAN) 1 MG tablet Take 1 tablet (1 mg total) by mouth at bedtime.   meloxicam (MOBIC) 7.5 MG tablet Take 1 tablet (7.5 mg total) by mouth daily.   OVER THE COUNTER MEDICATION Once PRN. Preservative Free Moisture Eye drops.   ROCKLATAN 0.02-0.005 % SOLN Apply to eye.   [DISCONTINUED] cloNIDine (CATAPRES) 0.1 MG tablet TAKE 1 TABLET BY MOUTH AT BEDTIME   [DISCONTINUED] verapamil (CALAN-SR) 180 MG CR tablet Take 1 tablet (180 mg total) by mouth at bedtime.     Allergies:   Bimatoprost, Levofloxacin, Penicillins, Shellfish allergy, Statins, Atorvastatin, Brimonidine, Dorzolamide hcl-timolol mal, Lovastatin, and Perindopril   Social History   Socioeconomic History   Marital status: Widowed    Spouse name: Not on file   Number of  children: Not on file   Years of education: Not on file   Highest education level: Not on file  Occupational History   Occupation: Retired  Tobacco Use   Smoking status: Former   Smokeless tobacco: Never  Scientific laboratory technician Use: Never used  Substance and Sexual Activity   Alcohol use: Yes    Alcohol/week: 3.0 standard drinks of alcohol    Types: 3 Glasses of wine per week    Comment: wine 3 x per week   Drug use: Never   Sexual activity: Not Currently  Other Topics Concern   Not on file  Social History Narrative   Grew up in [place], moved to [place] in her 20's   Retired Editor, 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   Husband died when she was in her 50's   Son and 2 daughters- all local  Has 5 grandchildren and 3 great grandchildren   Lives by herself   No pets   Used to enjoy golf   Enjoys golf   Social Determinants of Health   Financial Resource Strain: Low Risk  (01/29/2022)   Overall Financial Resource Strain (CARDIA)    Difficulty of Paying Living Expenses: Not hard at all  Food Insecurity: No Food Insecurity (01/29/2022)   Hunger Vital Sign    Worried About Running Out of Food in the Last Year: Never true    Ran Out of Food in the Last Year: Never true  Transportation Needs: No Transportation Needs (01/29/2022)   PRAPARE - Hydrologist (Medical): No    Lack of Transportation (Non-Medical): No  Physical Activity: Sufficiently Active (01/29/2022)   Exercise Vital Sign    Days of Exercise per Week: 7 days    Minutes of Exercise per Session: 60 min  Stress: No Stress Concern Present (01/29/2022)   [place]    Feeling of Stress : Not at all  Social Connections: Moderately Isolated (01/29/2022)   Social Connection and Isolation Panel [NHANES]    Frequency of Communication with Friends and Family: More than three times a week    Frequency of Social Gatherings with  Friends and Family: Three times a week    Attends Religious Services: More than 4 times per year    Active Member of Clubs or Organizations: No    Attends Archivist Meetings: Never    Marital Status: Widowed     Family History: The patient's family history includes Aneurysm in her brother; Cancer in her sister; Heart attack in her father; Lymphoma in her sister; Stroke in her sister.  ROS:   Review of Systems  Constitution: Negative for decreased appetite, fever and weight gain.  HENT: Negative for congestion, ear discharge, hoarse voice and sore throat.   Eyes: Negative for discharge, redness, vision loss in right eye and visual halos.  Cardiovascular: Report palpitations.  Negative for chest pain, dyspnea on exertion, leg swelling, orthopnea. Respiratory: Negative for cough, hemoptysis, shortness of breath and snoring.   Endocrine: Negative for heat intolerance and polyphagia.  Hematologic/Lymphatic: Negative for bleeding problem. Does not bruise/bleed easily.  Skin: Negative for flushing, nail changes, rash and suspicious lesions.  Musculoskeletal: Negative for arthritis, joint pain, muscle cramps, myalgias, neck pain and stiffness.  Gastrointestinal: Negative for abdominal pain, bowel incontinence, diarrhea and excessive appetite.  Genitourinary: Negative for decreased libido, genital sores and incomplete emptying.  Neurological: Negative for brief paralysis, focal weakness, headaches and loss of balance.  Psychiatric/Behavioral: Negative for altered mental status, depression and suicidal ideas.  Allergic/Immunologic: Negative for HIV exposure and persistent infections.    EKGs/Labs/Other Studies Reviewed:    The following studies were reviewed today:   EKG:  None today   TTE 07/10/2021 IMPRESSIONS    1. Left ventricular ejection fraction, by estimation, is 65 to 70%. The  left ventricle has normal function. The left ventricle has no regional  wall motion  abnormalities. There is moderate left ventricular hypertrophy.  Left ventricular diastolic  parameters are consistent with Grade I diastolic dysfunction (impaired  relaxation). Elevated left ventricular end-diastolic pressure. The E/e' is  48.   2. Right ventricular systolic function is normal. The right ventricular  size is normal.   3. The mitral valve is abnormal. Trivial mitral valve regurgitation.   4. The aortic valve is tricuspid. Aortic valve regurgitation  is not  visualized. Mild aortic valve sclerosis is present, with no evidence of  aortic valve stenosis.   Comparison(s): Changes from prior study are noted. 08/08/2020: LVEF 60-65%,  aortic valve sclerosis, grade 1 DD.   FINDINGS   Left Ventricle: Left ventricular ejection fraction, by estimation, is 65  to 70%. The left ventricle has normal function. The left ventricle has no  regional wall motion abnormalities. The left ventricular internal cavity  size was normal in size. There is   moderate left ventricular hypertrophy. Left ventricular diastolic  parameters are consistent with Grade I diastolic dysfunction (impaired  relaxation). Elevated left ventricular end-diastolic pressure. The E/e' is  16.   Right Ventricle: The right ventricular size is normal. No increase in  right ventricular wall thickness. Right ventricular systolic function is  normal.   Left Atrium: Left atrial size was normal in size.   Right Atrium: Right atrial size was normal in size.   Pericardium: There is no evidence of pericardial effusion.   Mitral Valve: The mitral valve is abnormal. There is mild thickening of  the mitral valve leaflet(s). There is mild calcification of the mitral  valve leaflet(s). Trivial mitral valve regurgitation.   Tricuspid Valve: The tricuspid valve is grossly normal. Tricuspid valve  regurgitation is trivial.   Aortic Valve: The aortic valve is tricuspid. Aortic valve regurgitation is  not visualized. Mild  aortic valve sclerosis is present, with no evidence  of aortic valve stenosis.   Pulmonic Valve: The pulmonic valve was normal in structure. Pulmonic valve  regurgitation is not visualized.   Aorta: The aortic root and ascending aorta are structurally normal, with  no evidence of dilitation.   Venous: The inferior vena cava was not well visualized.   IAS/Shunts: No atrial level shunt detected by color flow Doppler.   Recent Labs: 11/12/2022: BUN 38; Creatinine, Ser 0.70; Hemoglobin 12.9; Magnesium 2.5; Platelets 140; Potassium 5.4; Sodium 141  Recent Lipid Panel No results found for: "CHOL", "TRIG", "HDL", "CHOLHDL", "VLDL", "LDLCALC", "LDLDIRECT"  Physical Exam:    VS:  BP 122/66   Pulse 86   Ht '5\' 5"'$  (1.651 m)   Wt 166 lb 9.6 oz (75.6 kg)   SpO2 93%   BMI 27.72 kg/m     Wt Readings from Last 3 Encounters:  11/12/22 166 lb 9.6 oz (75.6 kg)  08/27/22 168 lb (76.2 kg)  07/16/22 160 lb (72.6 kg)     GEN: Well nourished, well developed in no acute distress HEENT: Normal NECK: No JVD; No carotid bruits LYMPHATICS: No lymphadenopathy CARDIAC: S1S2 noted,RRR, no murmurs, rubs, gallops RESPIRATORY:  Clear to auscultation without rales, wheezing or rhonchi  ABDOMEN: Soft, non-tender, non-distended, +bowel sounds, no guarding. EXTREMITIES: No edema, No cyanosis, no clubbing MUSCULOSKELETAL:  No deformity  SKIN: Warm and dry NEUROLOGIC:  Alert and oriented x 3, non-focal PSYCHIATRIC:  Normal affect, good insight  ASSESSMENT:    1. Medication management   2. Chest pain, unspecified type   3. Frequent PAC (premature atrial contraction)   4. PAT (paroxysmal atrial tachycardia)     PLAN:     From a cardiovascular standpoint she is stable.  Will continue her current medication regimen. Of note we have not seen and atrial fibrillation since I started taking care of the patient.  The patient is in agreement with the above plan. The patient left the office in stable  condition.  The patient will follow up in 1 yr or sooner if needed.   Medication  Adjustments/Labs and Tests Ordered: Current medicines are reviewed at length with the patient today.  Concerns regarding medicines are outlined above.  Orders Placed This Encounter  Procedures   Basic Metabolic Panel (BMET)   Magnesium   CBC   EKG 12-Lead   Meds ordered this encounter  Medications   verapamil (CALAN-SR) 180 MG CR tablet    Sig: Take 1 tablet (180 mg total) by mouth at bedtime.    Dispense:  90 tablet    Refill:  3   cloNIDine (CATAPRES) 0.1 MG tablet    Sig: Take 1 tablet (0.1 mg total) by mouth at bedtime.    Dispense:  90 tablet    Refill:  3    Patient Instructions  Medication Instructions:  Your physician recommends that you continue on your current medications as directed. Please refer to the Current Medication list given to you today.  *If you need a refill on your cardiac medications before your next appointment, please call your pharmacy*   Lab Work: Your physician recommends that you have the following labs drawn today: BMET, CBC and Magnesium  If you have labs (blood work) drawn today and your tests are completely normal, you will receive your results only by: [place] (if you have MyChart) OR A paper copy in the mail If you have any lab test that is abnormal or we need to change your treatment, we will call you to review the results.   Testing/Procedures: NONE   Follow-Up: At [facility], you and your health needs are our priority.  As part of our continuing mission to provide you with exceptional heart care, we have created designated Provider Care Teams.  These Care Teams include your primary Cardiologist (physician) and Advanced Practice Providers (APPs -  Physician Assistants and Nurse Practitioners) who all work together to provide you with the care you need, when you need it.  We recommend signing up for the patient portal called "MyChart".   Sign up information is provided on this After Visit Summary.  MyChart is used to connect with patients for Virtual Visits (Telemedicine).  Patients are able to view lab/test results, encounter notes, upcoming appointments, etc.  Non-urgent messages can be sent to your provider as well.   To learn more about what you can do with MyChart, go to [email or web address].    Your next appointment:   1 year(s)  The format for your next appointment:   In Person  Provider:   Berniece Salines, DO     Adopting a Healthy Lifestyle.  Know what a healthy weight is for you (roughly BMI <25) and aim to maintain this   Aim for 7+ servings of fruits and vegetables daily   65-80+ fluid ounces of water or unsweet tea for healthy kidneys   Limit to max 1 drink of alcohol per day; avoid smoking/tobacco   Limit animal fats in diet for cholesterol and heart health - choose grass fed whenever available   Avoid highly processed foods, and foods high in saturated/trans fats   Aim for low stress - take time to unwind and care for your mental health   Aim for 150 min of moderate intensity exercise weekly for heart health, and weights twice weekly for bone health   Aim for 7-9 hours of sleep daily   When it comes to diets, agreement about the perfect plan isnt easy to find, even among the experts. Experts at the [place] developed an idea known as  the Healthy Eating Plate. Just imagine a plate divided into logical, healthy portions.   The emphasis is on diet quality:   Load up on vegetables and fruits - one-half of your plate: Aim for color and variety, and remember that potatoes dont count.   Go for whole grains - one-quarter of your plate: Whole wheat, barley, wheat berries, quinoa, oats, brown rice, and foods made with them. If you want pasta, go with whole wheat pasta.   Protein power - one-quarter of your plate: Fish, chicken, beans, and nuts are all healthy, versatile protein  sources. Limit red meat.   The diet, however, does go beyond the plate, offering a few other suggestions.   Use healthy plant oils, such as olive, canola, soy, corn, sunflower and peanut. Check the labels, and avoid partially hydrogenated oil, which have unhealthy trans fats.   If youre thirsty, drink water. Coffee and tea are good in moderation, but skip sugary drinks and limit milk and dairy products to one or two daily servings.   The type of carbohydrate in the diet is more important than the amount. Some sources of carbohydrates, such as vegetables, fruits, whole grains, and beans-are healthier than others.   Finally, stay active  Signed, Berniece Salines, DO  11/13/2022 11:52 AM    [place]

## 2022-11-13 LAB — BASIC METABOLIC PANEL
BUN/Creatinine Ratio: 54 — ABNORMAL HIGH (ref 12–28)
BUN: 38 mg/dL — ABNORMAL HIGH (ref 10–36)
CO2: 27 mmol/L (ref 20–29)
Calcium: 9.9 mg/dL (ref 8.7–10.3)
Chloride: 102 mmol/L (ref 96–106)
Creatinine, Ser: 0.7 mg/dL (ref 0.57–1.00)
Glucose: 80 mg/dL (ref 70–99)
Potassium: 5.4 mmol/L — ABNORMAL HIGH (ref 3.5–5.2)
Sodium: 141 mmol/L (ref 134–144)
eGFR: 80 mL/min/{1.73_m2} (ref >59)

## 2022-11-13 LAB — CBC
Hematocrit: 37.5 % (ref 34.0–46.6)
Hemoglobin: 12.9 g/dL (ref 11.1–15.9)
MCH: 33.7 pg — ABNORMAL HIGH (ref 26.6–33.0)
MCHC: 34.4 g/dL (ref 31.5–35.7)
MCV: 98 fL — ABNORMAL HIGH (ref 79–97)
Platelets: 140 10*3/uL — ABNORMAL LOW (ref 150–450)
RBC: 3.83 x10E6/uL (ref 3.77–5.28)
RDW: 12.4 % (ref 11.7–15.4)
WBC: 5 10*3/uL (ref 3.4–10.8)

## 2022-11-13 LAB — MAGNESIUM: Magnesium: 2.5 mg/dL — ABNORMAL HIGH (ref 1.6–2.3)

## 2022-11-14 ENCOUNTER — Other Ambulatory Visit: Payer: Self-pay | Admitting: Family

## 2022-11-23 ENCOUNTER — Encounter: Payer: PPO | Admitting: Family Medicine

## 2022-11-23 ENCOUNTER — Encounter: Payer: Self-pay | Admitting: Family Medicine

## 2022-11-24 DIAGNOSIS — B9689 Other specified bacterial agents as the cause of diseases classified elsewhere: Secondary | ICD-10-CM | POA: Diagnosis not present

## 2022-11-24 DIAGNOSIS — R062 Wheezing: Secondary | ICD-10-CM | POA: Diagnosis not present

## 2022-11-24 DIAGNOSIS — J988 Other specified respiratory disorders: Secondary | ICD-10-CM | POA: Diagnosis not present

## 2022-11-24 DIAGNOSIS — I10 Essential (primary) hypertension: Secondary | ICD-10-CM | POA: Diagnosis not present

## 2022-11-26 ENCOUNTER — Ambulatory Visit (HOSPITAL_BASED_OUTPATIENT_CLINIC_OR_DEPARTMENT_OTHER)
Admission: RE | Admit: 2022-11-26 | Discharge: 2022-11-26 | Disposition: A | Discharge status: Home / Self Care | Payer: PPO | Source: Ambulatory Visit | Attending: Family | Admitting: Family

## 2022-11-26 ENCOUNTER — Ambulatory Visit (INDEPENDENT_AMBULATORY_CARE_PROVIDER_SITE_OTHER): Payer: PPO | Admitting: Family

## 2022-11-26 VITALS — BP 170/80 | HR 68 | Temp 98.4°F | Resp 16 | Wt 161.0 lb

## 2022-11-26 DIAGNOSIS — R059 Cough, unspecified: Secondary | ICD-10-CM | POA: Insufficient documentation

## 2022-11-26 DIAGNOSIS — H6121 Impacted cerumen, right ear: Secondary | ICD-10-CM | POA: Diagnosis not present

## 2022-11-26 DIAGNOSIS — I10 Essential (primary) hypertension: Secondary | ICD-10-CM

## 2022-11-26 LAB — CBC WITH DIFFERENTIAL/PLATELET
Basophils Absolute: 0 10*3/uL (ref 0.0–0.1)
Basophils Relative: 0.3 % (ref 0.0–3.0)
Eosinophils Absolute: 0.1 10*3/uL (ref 0.0–0.7)
Eosinophils Relative: 0.7 % (ref 0.0–5.0)
HCT: 37.7 % (ref 36.0–46.0)
Hemoglobin: 13 g/dL (ref 12.0–15.0)
Lymphocytes Relative: 7.5 % — ABNORMAL LOW (ref 12.0–46.0)
Lymphs Abs: 0.6 10*3/uL — ABNORMAL LOW (ref 0.7–4.0)
MCHC: 34.4 g/dL (ref 30.0–36.0)
MCV: 98.8 fl (ref 78.0–100.0)
Monocytes Absolute: 0.7 10*3/uL (ref 0.1–1.0)
Monocytes Relative: 8 % (ref 3.0–12.0)
Neutro Abs: 6.9 10*3/uL (ref 1.4–7.7)
Neutrophils Relative %: 83.5 % — ABNORMAL HIGH (ref 43.0–77.0)
Platelets: 167 10*3/uL (ref 150.0–400.0)
RBC: 3.82 Mil/uL — ABNORMAL LOW (ref 3.87–5.11)
RDW: 12.8 % (ref 11.5–15.5)
WBC: 8.2 10*3/uL (ref 4.0–10.5)

## 2022-11-26 LAB — COMPREHENSIVE METABOLIC PANEL
ALT: 14 U/L (ref 0–35)
AST: 12 U/L (ref 0–37)
Albumin: 4 g/dL (ref 3.5–5.2)
Alkaline Phosphatase: 47 U/L (ref 39–117)
BUN: 27 mg/dL — ABNORMAL HIGH (ref 6–23)
CO2: 34 mEq/L — ABNORMAL HIGH (ref 19–32)
Calcium: 9.8 mg/dL (ref 8.4–10.5)
Chloride: 99 mEq/L (ref 96–112)
Creatinine, Ser: 0.52 mg/dL (ref 0.40–1.20)
GFR: 79.22 mL/min (ref >60.00)
Glucose, Bld: 111 mg/dL — ABNORMAL HIGH (ref 70–99)
Potassium: 4.7 mEq/L (ref 3.5–5.1)
Sodium: 139 mEq/L (ref 135–145)
Total Bilirubin: 0.4 mg/dL (ref 0.2–1.2)
Total Protein: 6.8 g/dL (ref 6.0–8.3)

## 2022-11-26 MED ORDER — CLONIDINE HCL 0.1 MG PO TABS: 0.1000 mg | ORAL_TABLET | Freq: Two times a day (BID) | ORAL | 1 refills | Status: DC

## 2022-11-26 NOTE — Addendum Note (Signed)
Addended by: [name] on: 11/26/2022 10:05 AM   Modules accepted: Level of Service

## 2022-11-26 NOTE — Assessment & Plan Note (Signed)
Uncontrolled.   BP Readings from Last 3 Encounters:  11/26/22 (!) 170/80  11/12/22 122/66  08/27/22 (!) 140/80   Will increase her clonidine from HS only to bid. Follow back up in 1 week for bp recheck.

## 2022-11-26 NOTE — Progress Notes (Addendum)
Subjective:   By signing my name below, I, Carla Chandler, attest that this documentation has been prepared under the direction and in the presence of Carla Pear, NP 11/26/22   Patient ID: Carla Chandler, female    DOB: 1926-08-27, 89 y.o.   MRN: [id number]  Chief Complaint  Patient presents with   Hypertension    Here for follow up   Follow-up    Follow up after urgent care visit for viral uri, on prednisone, doxycycline and albuterol.   Ear Fullness    Complains of bilateral ear fullness    HPI Patient is in today for 3 month follow up.  URI: Patient states that she was scheduled for a virtual visit with Carla Chandler on 11/23/22 but after waiting for 2 hours, Carla Chandler never joined the call. She was seen at urgent care on 11/24/22 for symptoms of cough, nausea, vomiting, and headaches. She states that she started to have symptoms on 11/13/22 and had some improvement for a while before symptoms worsened again. Urgent care started her on Prednisone, Doxycycline, and Albuterol inhaler. Her symptoms have improved somewhat since urgent care. She notes weight loss secondary to her symptoms. Her appetite has progressively improved.  She is primarily concerned with her elevated blood pressure today.  Hypertension: She has been compliant with Verapamil '180mg'$  nightly and Clonidine 0.'1mg'$  nightly. Her blood pressure improved to 170/80 from 192/87 on recheck during visit.  She has been compliant with all of her medications despite her illness. She does not check her BP at home as her machine readings have been inaccurate. BP Readings from Last 5 Encounters:  11/26/22 (!) 170/80  11/12/22 122/66  08/27/22 (!) 140/80  07/16/22 130/60  05/21/22 124/64    Ear fullness: She reports decreased hearing and sensation of ear fullness on the right. She would like cerumen to be flushed today.  Past Medical History:  Diagnosis Date   Accelerated hypertension 12/04/2015   AF (paroxysmal atrial  fibrillation) ([place]) 06/12/2020   Anxiety    Bilateral impacted cerumen 02/12/2018   Breast cancer ([facility])    right   CN VI palsy, left 03/13/2019   Cyst of joint of shoulder 12/20/2017   Dizziness 11/12/2020   DLBCL (diffuse large B cell lymphoma) ([place]) 02/29/2016   Drusen of macula of both eyes 02/29/2016   Dry eyes, bilateral 02/29/2016   Dyslipidemia, goal LDL below 130 10/26/2016   Dysphagia 05/25/2015   Dyspnea 05/25/2015   Essential hypertension 05/27/2015   Excessive cerumen in ear canal, bilateral 04/26/2017   Frequent PAC (premature atrial contraction) 01/09/2021   Gastroesophageal reflux disease 06/12/2020   Glaucoma 06/12/2020   History of B-cell lymphoma 10/26/2016   History of breast cancer 06/12/2020   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 2013 rt radiation with balloon   Hyperlipidemia    Hypertension    Hyponatremia 05/25/2015   Formatting of this note might be different from the original. Mild and likely 2ry to diuretic use   Idiopathic peripheral neuropathy 10/26/2016   Insomnia 06/12/2020   Knee effusion, right 01/22/2016   Laryngeal edema 05/25/2015   Lymphoma ([facility]) 05/05/2015   Microcalcification of left breast on mammogram 09/27/2017   Murmur, cardiac 07/11/2020   Osteopenia of left thigh 10/26/2016   Other visual disturbances 12/04/2015   PAF (paroxysmal atrial fibrillation) ([facility]) 07/11/2020   PAT (paroxysmal atrial tachycardia) 01/09/2021  Poor balance 06/12/2020   Primary hypertension 11/12/2020   Primary open angle glaucoma of left eye, moderate stage 02/29/2016   Primary osteoarthritis of both knees 01/22/2016   Primary osteoarthritis of both shoulders 04/12/2017   Primary osteoarthritis of left knee 01/11/2017   Pseudophakia of both eyes 02/29/2016   PVD (posterior vitreous detachment), both eyes 02/29/2016   Radiation injury 05/27/2015   Right rotator cuff tear arthropathy 01/03/2018    Sensorineural hearing loss (SNHL) of both ears 04/26/2017   Shortness of breath 02/13/2021   Varicose veins of left lower extremity 06/11/2020    Past Surgical History:  Procedure Laterality Date   BLADDER SUSPENSION     BREAST SURGERY Right 2008   lumpectomy   LAPAROSCOPIC HYSTERECTOMY     LYMPHADENECTOMY Right 2016    Family History  Problem Relation Age of Onset   Heart attack Father    Lymphoma Sister    Aneurysm Brother    Cancer Sister    Stroke Sister     Social History   Socioeconomic History   Marital status: Widowed    Spouse name: Not on file   Number of children: Not on file   Years of education: Not on file   Highest education level: Not on file  Occupational History   Occupation: Retired  Tobacco Use   Smoking status: Former   Smokeless tobacco: Never  Scientific laboratory technician Use: Never used  Substance and Sexual Activity   Alcohol use: Yes    Alcohol/week: 3.0 standard drinks of alcohol    Types: 3 Glasses of wine per week    Comment: wine 3 x per week   Drug use: Never   Sexual activity: Not Currently  Other Topics Concern   Not on file  Social History Narrative   Grew up in [place], moved to [place] in her 20's   Retired Editor, 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   Husband died when she was in her 58's   Son and 2 daughters- all local   Has 5 grandchildren and 3 great grandchildren   Lives by herself   No pets   Used to enjoy golf   Enjoys golf   Social Determinants of Health   Financial Resource Strain: Low Risk  (01/29/2022)   Overall Financial Resource Strain (CARDIA)    Difficulty of Paying Living Expenses: Not hard at all  Food Insecurity: No Food Insecurity (01/29/2022)   Hunger Vital Sign    Worried About Running Out of Food in the Last Year: Never true    [place] in the Last Year: Never true  Transportation Needs: No Transportation Needs (01/29/2022)   PRAPARE - Hydrologist (Medical): No     Lack of Transportation (Non-Medical): No  Physical Activity: Sufficiently Active (01/29/2022)   Exercise Vital Sign    Days of Exercise per Week: 7 days    Minutes of Exercise per Session: 60 min  Stress: No Stress Concern Present (01/29/2022)   [place]    Feeling of Stress : Not at all  Social Connections: Moderately Isolated (01/29/2022)   Social Connection and Isolation Panel [NHANES]    Frequency of Communication with Friends and Family: More than three times a week    Frequency of Social Gatherings with Friends and Family: Three times a week    Attends Religious Services: More than 4 times per year  Active Member of Clubs or Organizations: No    Attends Archivist Meetings: Never    Marital Status: Widowed  Intimate Partner Violence: Not At Risk (01/29/2022)   Humiliation, Afraid, Rape, and Kick questionnaire    Fear of Current or Ex-Partner: No    Emotionally Abused: No    Physically Abused: No    Sexually Abused: No    Outpatient Medications Prior to Visit  Medication Sig Dispense Refill   b complex vitamins capsule Take 1 capsule by mouth daily.     citalopram (CELEXA) 20 MG tablet Take 1 tablet by mouth once daily 90 tablet 0   clobetasol (TEMOVATE) 0.05 % external solution Apply topically 2 (two) times daily as needed.     famotidine (PEPCID) 20 MG tablet Take 1 tablet (20 mg total) by mouth 2 (two) times daily. 180 tablet 1   Guselkumab (TREMFYA [place]) Inject into the skin.     LORazepam (ATIVAN) 1 MG tablet Take 1 tablet (1 mg total) by mouth at bedtime. 90 tablet 0   meloxicam (MOBIC) 7.5 MG tablet Take 1 tablet (7.5 mg total) by mouth daily. 30 tablet 3   OVER THE COUNTER MEDICATION Once PRN. Preservative Free Moisture Eye drops.     ROCKLATAN 0.02-0.005 % SOLN Apply to eye.     verapamil (CALAN-SR) 180 MG CR tablet Take 1 tablet (180 mg total) by mouth at bedtime. 90 tablet 3   cloNIDine  (CATAPRES) 0.1 MG tablet Take 1 tablet (0.1 mg total) by mouth at bedtime. 90 tablet 3   No facility-administered medications prior to visit.    Allergies  Allergen Reactions   Bimatoprost Other (See Comments)    toxicity   Levofloxacin Diarrhea   Penicillins Itching   Shellfish Allergy Other (See Comments)   Statins Other (See Comments)    Myalgias - "all statins"  Per pt   Atorvastatin Other (See Comments)   Brimonidine Other (See Comments)   Dorzolamide Hcl-Timolol Mal Other (See Comments)   Lovastatin Other (See Comments)   Perindopril Other (See Comments)    Review of Systems  Constitutional:  Positive for weight loss. Negative for fever and malaise/fatigue.  HENT:  Negative for congestion.        + Bilateral ear fullness sensation  Eyes:  Negative for blurred vision.  Respiratory:  Positive for cough. Negative for shortness of breath.   Cardiovascular:  Negative for chest pain, palpitations and leg swelling.  Gastrointestinal:  Positive for nausea. Negative for abdominal pain and blood in stool.  Genitourinary:  Negative for dysuria and frequency.  Musculoskeletal:  Negative for falls.  Skin:  Negative for rash.  Neurological:  Positive for headaches. Negative for dizziness and loss of consciousness.  Endo/Heme/Allergies:  Negative for environmental allergies.  Psychiatric/Behavioral:  Negative for depression. The patient is not nervous/anxious.        Objective:    Physical Exam Vitals and nursing note reviewed.  Constitutional:      General: She is not in acute distress.    Appearance: She is well-developed.  HENT:     Head: Normocephalic and atraumatic.     Right Ear: There is impacted cerumen.     Left Ear: Tympanic membrane, ear canal and external ear normal.     Nose: Nose normal.  Eyes:     General:        Right eye: No discharge.        Left eye: No discharge.  Cardiovascular:  Rate and Rhythm: Normal rate and regular rhythm.     Heart sounds:  No murmur heard. Pulmonary:     Effort: Pulmonary effort is normal.     Breath sounds: Rhonchi (expiratory) present.  Abdominal:     General: Bowel sounds are normal.     Palpations: Abdomen is soft.     Tenderness: There is no abdominal tenderness.  Musculoskeletal:     Cervical back: Normal range of motion and neck supple.  Skin:    General: Skin is warm and dry.  Neurological:     Mental Status: She is alert and oriented to person, place, and time.     BP (!) 170/80 (BP Location: Left Arm, Patient Position: Sitting)   Pulse 68   Temp 98.4 F (36.9 C) (Oral)   Resp 16   Wt 161 lb (73 kg)   SpO2 96%   BMI 26.79 kg/m  Wt Readings from Last 3 Encounters:  11/26/22 161 lb (73 kg)  11/12/22 166 lb 9.6 oz (75.6 kg)  08/27/22 168 lb (76.2 kg)       Assessment & Plan:  Cough, unspecified type Assessment & Plan: Showing some improvement, but slow.  Will obtain CXR and labs as ordered.   Orders: -     Comprehensive metabolic panel -     CBC with Differential/Platelet -     DG Chest 2 View; Future  Primary hypertension Assessment & Plan: Uncontrolled.   BP Readings from Last 3 Encounters:  11/26/22 (!) 170/80  11/12/22 122/66  08/27/22 (!) 140/80   Will increase her clonidine from HS only to bid. Follow back up in 1 week for bp recheck.    Hearing loss of right ear due to cerumen impaction Assessment & Plan: New. After verbal consent was obtained, R ear was irrigated by CMA. Pt had successful removal of cerumen and tolerated procedure well.    Other orders -     cloNIDine HCl; Take 1 tablet (0.1 mg total) by mouth 2 (two) times daily.  Dispense: 180 tablet; Refill: 1     I,Alexis Herring,acting as a scribe for Carla Pear, NP.,have documented all relevant documentation on the behalf of Carla Pear, NP,as directed by  Carla Pear, NP while in the presence of Carla Pear, NP.   I, Carla Pear, NP, personally  preformed the services described in this documentation.  All medical record entries made by the scribe were at my direction and in my presence.  I have reviewed the chart and discharge instructions (if applicable) and agree that the record reflects my personal performance and is accurate and complete. 11/26/22   Carla Pear, NP

## 2022-11-26 NOTE — Patient Instructions (Signed)
Complete lab work prior to leaving. Change clonidine to twice daily. Complete x-ray on the first floor.

## 2022-11-26 NOTE — Assessment & Plan Note (Signed)
New. After verbal consent was obtained, R ear was irrigated by CMA. Pt had successful removal of cerumen and tolerated procedure well.

## 2022-11-26 NOTE — Assessment & Plan Note (Signed)
Showing some improvement, but slow.  Will obtain CXR and labs as ordered.

## 2022-11-26 NOTE — Assessment & Plan Note (Signed)
>>  ASSESSMENT AND PLAN FOR HYPERTENSION WRITTEN ON 11/26/2022  9:56 AM BY O'SULLIVAN, Veretta Sabourin, NP  Uncontrolled.   BP Readings from Last 3 Encounters:  11/26/22 (!) 170/80  11/12/22 122/66  08/27/22 (!) 140/80   Will increase her clonidine from HS only to bid. Follow back up in 1 week for bp recheck.

## 2022-12-03 ENCOUNTER — Ambulatory Visit (INDEPENDENT_AMBULATORY_CARE_PROVIDER_SITE_OTHER): Payer: PPO | Admitting: Family

## 2022-12-03 VITALS — BP 114/59 | HR 70 | Temp 97.7°F | Resp 16 | Wt 161.0 lb

## 2022-12-03 DIAGNOSIS — F419 Anxiety disorder, unspecified: Secondary | ICD-10-CM

## 2022-12-03 DIAGNOSIS — I10 Essential (primary) hypertension: Secondary | ICD-10-CM | POA: Diagnosis not present

## 2022-12-03 DIAGNOSIS — F32A Depression, unspecified: Secondary | ICD-10-CM | POA: Diagnosis not present

## 2022-12-03 DIAGNOSIS — R059 Cough, unspecified: Secondary | ICD-10-CM | POA: Diagnosis not present

## 2022-12-03 MED ORDER — FLUOXETINE HCL 10 MG PO CAPS
ORAL_CAPSULE | ORAL | 0 refills | Status: DC
Start: 2022-12-03 — End: 2023-01-11

## 2022-12-03 NOTE — Patient Instructions (Signed)
Decrease citalopram to 1/2 tab once daily for 1 week then stop. Start prozac '10mg'$  once daily for 1 week then increase to 2 caps on week two.

## 2022-12-03 NOTE — Assessment & Plan Note (Addendum)
Uncontrolled. She is on max dose for her age of citalopram.  Will plan to transition her to prozac as follows:  Decrease citalopram to 1/2 tab once daily for 1 week then stop. Start prozac '10mg'$  once daily for 1 week then increase to 2 caps on week two.

## 2022-12-03 NOTE — Progress Notes (Signed)
Subjective:     Patient ID: Carla Chandler, female    DOB: December 23, 1925, 89 y.o.   MRN: [id number]  No chief complaint on file.   HPI Patient is in today for 1 week follow up.    HTN-  last visit her bp was elevated and we increased her clonidine from HS only to BID.  She reports that she has had daily headaches.    BP Readings from Last 3 Encounters:  12/03/22 (!) 114/59  11/26/22 (!) 170/80  11/12/22 122/66   Glaucoma- following with ophthalmology.  Does report a daily mild HA, but declines neurology referral at this time.   Cough- last visit she noted continue cough but it was improving. CXR showed no pneumonia, ? Mild interstitial edema versus chronic bronchitis changes. She reports that her cough continues to improve.    She is taking meloxicam plus tylenol- this does not help her headache.    Daughter notes that patient has seemed more down lately.  Not keeping up with chores around the house, not showering regularly. She is concerned that she may be depressed. Also she becomes anxious easily.      12/03/2022   11:01 AM 01/29/2022    1:20 PM 04/10/2021    9:13 AM 06/11/2020   10:18 AM  Depression screen PHQ 2/9  Decreased Interest 3 0 1 0  Down, Depressed, Hopeless 0 0 1 0  PHQ - 2 Score 3 0 2 0  Altered sleeping 0     Tired, decreased energy 3     Change in appetite 0     Feeling bad or failure about yourself  0     Trouble concentrating 2     Moving slowly or fidgety/restless 2     Suicidal thoughts 0     PHQ-9 Score 10     Difficult doing work/chores Not difficult at all         Health Maintenance Due  Topic Date Due   DTaP/Tdap/Td (1 - Tdap) Never done   Zoster Vaccines- Shingrix (1 of 2) Never done   DEXA SCAN  Never done   COVID-19 Vaccine (5 - 2023-24 season) 07/23/2022   Medicare Annual Wellness (AWV)  01/30/2023    Past Medical History:  Diagnosis Date   Accelerated hypertension 12/04/2015   AF (paroxysmal atrial fibrillation) ([facility])  06/12/2020   Anxiety    Bilateral impacted cerumen 02/12/2018   Breast cancer ([facility])    right   CN VI palsy, left 03/13/2019   Cyst of joint of shoulder 12/20/2017   Dizziness 11/12/2020   DLBCL (diffuse large B cell lymphoma) ([facility]) 02/29/2016   Drusen of macula of both eyes 02/29/2016   Dry eyes, bilateral 02/29/2016   Dyslipidemia, goal LDL below 130 10/26/2016   Dysphagia 05/25/2015   Dyspnea 05/25/2015   Essential hypertension 05/27/2015   Excessive cerumen in ear canal, bilateral 04/26/2017   Frequent PAC (premature atrial contraction) 01/09/2021   Gastroesophageal reflux disease 06/12/2020   Glaucoma 06/12/2020   History of B-cell lymphoma 10/26/2016   History of breast cancer 06/12/2020   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 2013 rt radiation with balloon   Hyperlipidemia    Hypertension    Hyponatremia 05/25/2015   Formatting of this note might be different from the original. Mild and likely 2ry to diuretic use   Idiopathic peripheral neuropathy 10/26/2016   Insomnia  06/12/2020   Knee effusion, right 01/22/2016   Laryngeal edema 05/25/2015   Lymphoma ([facility]) 05/05/2015   Microcalcification of left breast on mammogram 09/27/2017   Murmur, cardiac 07/11/2020   Osteopenia of left thigh 10/26/2016   Other visual disturbances 12/04/2015   PAF (paroxysmal atrial fibrillation) ([facility]) 07/11/2020   PAT (paroxysmal atrial tachycardia) 01/09/2021   Poor balance 06/12/2020   Primary hypertension 11/12/2020   Primary open angle glaucoma of left eye, moderate stage 02/29/2016   Primary osteoarthritis of both knees 01/22/2016   Primary osteoarthritis of both shoulders 04/12/2017   Primary osteoarthritis of left knee 01/11/2017   Pseudophakia of both eyes 02/29/2016   PVD (posterior vitreous detachment), both eyes 02/29/2016   Radiation injury 05/27/2015   Right rotator cuff tear arthropathy 01/03/2018   Sensorineural  hearing loss (SNHL) of both ears 04/26/2017   Shortness of breath 02/13/2021   Varicose veins of left lower extremity 06/11/2020    Past Surgical History:  Procedure Laterality Date   BLADDER SUSPENSION     BREAST SURGERY Right 2008   lumpectomy   LAPAROSCOPIC HYSTERECTOMY     LYMPHADENECTOMY Right 2016    Family History  Problem Relation Age of Onset   Heart attack Father    Lymphoma Sister    Aneurysm Brother    Cancer Sister    Stroke Sister     Social History   Socioeconomic History   Marital status: Widowed    Spouse name: Not on file   Number of children: Not on file   Years of education: Not on file   Highest education level: Not on file  Occupational History   Occupation: Retired  Tobacco Use   Smoking status: Former   Smokeless tobacco: Never  Scientific laboratory technician Use: Never used  Substance and Sexual Activity   Alcohol use: Yes    Alcohol/week: 3.0 standard drinks of alcohol    Types: 3 Glasses of wine per week    Comment: wine 3 x per week   Drug use: Never   Sexual activity: Not Currently  Other Topics Concern   Not on file  Social History Narrative   Grew up in [place], moved to [place] in her 20's   Retired Editor, 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   Husband died when she was in her 59's   Son and 2 daughters- all local   Has 5 grandchildren and 3 great grandchildren   Lives by herself   No pets   Used to enjoy golf   Enjoys golf   Social Determinants of Health   Financial Resource Strain: Low Risk  (01/29/2022)   Overall Financial Resource Strain (CARDIA)    Difficulty of Paying Living Expenses: Not hard at all  Food Insecurity: No Food Insecurity (01/29/2022)   Hunger Vital Sign    Worried About Running Out of Food in the Last Year: Never true    [place] in the Last Year: Never true  Transportation Needs: No Transportation Needs (01/29/2022)   PRAPARE - Hydrologist (Medical): No    Lack of  Transportation (Non-Medical): No  Physical Activity: Sufficiently Active (01/29/2022)   Exercise Vital Sign    Days of Exercise per Week: 7 days    Minutes of Exercise per Session: 60 min  Stress: No Stress Concern Present (01/29/2022)   [place]    Feeling of Stress :  Not at all  Social Connections: Moderately Isolated (01/29/2022)   Social Connection and Isolation Panel [NHANES]    Frequency of Communication with Friends and Family: More than three times a week    Frequency of Social Gatherings with Friends and Family: Three times a week    Attends Religious Services: More than 4 times per year    Active Member of Clubs or Organizations: No    Attends Archivist Meetings: Never    Marital Status: Widowed  Intimate Partner Violence: Not At Risk (01/29/2022)   Humiliation, Afraid, Rape, and Kick questionnaire    Fear of Current or Ex-Partner: No    Emotionally Abused: No    Physically Abused: No    Sexually Abused: No    Outpatient Medications Prior to Visit  Medication Sig Dispense Refill   b complex vitamins capsule Take 1 capsule by mouth daily.     clobetasol (TEMOVATE) 0.05 % external solution Apply topically 2 (two) times daily as needed.     cloNIDine (CATAPRES) 0.1 MG tablet Take 1 tablet (0.1 mg total) by mouth 2 (two) times daily. 180 tablet 1   famotidine (PEPCID) 20 MG tablet Take 1 tablet (20 mg total) by mouth 2 (two) times daily. 180 tablet 1   Guselkumab (TREMFYA [place]) Inject into the skin.     LORazepam (ATIVAN) 1 MG tablet Take 1 tablet (1 mg total) by mouth at bedtime. 90 tablet 0   meloxicam (MOBIC) 7.5 MG tablet Take 1 tablet (7.5 mg total) by mouth daily. 30 tablet 3   OVER THE COUNTER MEDICATION Once PRN. Preservative Free Moisture Eye drops.     ROCKLATAN 0.02-0.005 % SOLN Apply to eye.     verapamil (CALAN-SR) 180 MG CR tablet Take 1 tablet (180 mg total) by mouth at bedtime. 90 tablet  3   citalopram (CELEXA) 20 MG tablet Take 1 tablet by mouth once daily 90 tablet 0   No facility-administered medications prior to visit.    Allergies  Allergen Reactions   Bimatoprost Other (See Comments)    toxicity   Levofloxacin Diarrhea   Penicillins Itching   Shellfish Allergy Other (See Comments)   Statins Other (See Comments)    Myalgias - "all statins"  Per pt   Atorvastatin Other (See Comments)   Brimonidine Other (See Comments)   Dorzolamide Hcl-Timolol Mal Other (See Comments)   Lovastatin Other (See Comments)   Perindopril Other (See Comments)    ROS See HPI    Objective:    Physical Exam Constitutional:      General: She is not in acute distress.    Appearance: Normal appearance. She is well-developed.  HENT:     Head: Normocephalic and atraumatic.     Right Ear: External ear normal.     Left Ear: External ear normal.  Eyes:     General: No scleral icterus. Neck:     Thyroid: No thyromegaly.  Cardiovascular:     Rate and Rhythm: Normal rate and regular rhythm.     Heart sounds: Normal heart sounds. No murmur heard. Pulmonary:     Effort: Pulmonary effort is normal. No respiratory distress.     Breath sounds: Normal breath sounds. No wheezing.  Musculoskeletal:     Cervical back: Neck supple.  Skin:    General: Skin is warm and dry.  Neurological:     Mental Status: She is alert and oriented to person, place, and time.  Psychiatric:  Mood and Affect: Mood normal.        Behavior: Behavior normal.        Thought Content: Thought content normal.        Judgment: Judgment normal.     BP (!) 114/59 (BP Location: Left Arm, Patient Position: Sitting, Cuff Size: Small)   Pulse 70   Temp 97.7 F (36.5 C) (Oral)   Resp 16   Wt 161 lb (73 kg)   SpO2 97%   BMI 26.79 kg/m  Wt Readings from Last 3 Encounters:  12/03/22 161 lb (73 kg)  11/26/22 161 lb (73 kg)  11/12/22 166 lb 9.6 oz (75.6 kg)       Assessment & Plan:   Problem List  Items Addressed This Visit       Unprioritized   Essential hypertension - Primary    Bp is stable/improved with the bid dosing of clonidine. Continue same.       Cough    Continues to improve.  Monitor.       Anxiety and depression    Uncontrolled. She is on max dose for her age of citalopram.  Will plan to transition her to prozac as follows:  Decrease citalopram to 1/2 tab once daily for 1 week then stop. Start prozac '10mg'$  once daily for 1 week then increase to 2 caps on week two.      Relevant Medications   FLUoxetine (PROZAC) 10 MG capsule    I have discontinued Juanita Laster. Magowan's citalopram. I am also having her start on FLUoxetine. Additionally, I am having her maintain her b complex vitamins, [place], OVER THE COUNTER MEDICATION, Guselkumab (TREMFYA [place]), meloxicam, famotidine, LORazepam, clobetasol, verapamil, and cloNIDine.  Meds ordered this encounter  Medications   FLUoxetine (PROZAC) 10 MG capsule    Sig: Take 1 cap by mouth once daily for 1 week, then increase to 2 tabs on week two.    Dispense:  60 capsule    Refill:  0    Order Specific Question:   Supervising Provider    Answer:   Penni Homans A [[id number]]

## 2022-12-03 NOTE — Assessment & Plan Note (Signed)
Bp is stable/improved with the bid dosing of clonidine. Continue same.

## 2022-12-03 NOTE — Assessment & Plan Note (Signed)
Continues to improve.  Monitor.

## 2022-12-08 ENCOUNTER — Encounter: Payer: Self-pay | Admitting: Family

## 2022-12-09 MED ORDER — ASPIRIN 81 MG PO TBEC: 81.0000 mg | DELAYED_RELEASE_TABLET | Freq: Every day | ORAL | 12 refills | Status: DC

## 2022-12-09 NOTE — Telephone Encounter (Signed)
Patient's daughter called to schedule virtual visit with Melissa to discuss her mother's intermittent confusion and confusion, slurred speech. Patient has been scheduled for tomorrow with provider. Mardene Celeste, daughter, stated she sent a message to [facility] as well to give her as much info as possible before the appt tomorrow.

## 2022-12-10 ENCOUNTER — Telehealth (INDEPENDENT_AMBULATORY_CARE_PROVIDER_SITE_OTHER): Payer: PPO | Admitting: Family

## 2022-12-10 ENCOUNTER — Telehealth: Payer: Self-pay | Admitting: Family

## 2022-12-10 DIAGNOSIS — G459 Transient cerebral ischemic attack, unspecified: Secondary | ICD-10-CM | POA: Diagnosis not present

## 2022-12-10 NOTE — Telephone Encounter (Signed)
Patient will be here Monday at 10:15 am

## 2022-12-10 NOTE — Progress Notes (Signed)
MyChart Video Visit    Virtual Visit via Video Note   This visit type was conducted due to national recommendations for restrictions regarding the COVID-19 Pandemic (e.g. social distancing) in an effort to limit this patient's exposure and mitigate transmission in our community. This patient is at least at moderate risk for complications without adequate follow up. This format is felt to be most appropriate for this patient at this time. Physical exam was limited by quality of the video and audio technology used for the visit. CMA was able to get the patient set up on a video visit.  Patient location: Home Patient, patient's daughter and provider in visit Provider location: Office  I discussed the limitations of evaluation and management by telemedicine and the availability of in person appointments. The patient expressed understanding and agreed to proceed.  Visit Date: 12/10/2022  Today's healthcare provider: Nance Pear, NP     Subjective:    Patient ID: Carla Chandler, female    DOB: 01/06/1926, 89 y.o.   MRN: [id number]  Chief Complaint  Patient presents with   Mental status    "Patient concern she has had TIA". Forgetting some things    HPI Patient is in today for a video visit.   Memory loss/Slurred speech- She complains of an episode of memory loss and slurred words. A couple days ago she forgot completely how to set the time on her phone and spent 1 hours trying to figure it out. Her daughters also report she was slurring her speech. She notes feeling lightheaded during christmas and had to sit down quickly so she would not pass out. She has more confusion overall compared to her baseline. She has not started taking baby aspirin. She is willing to start taking it.  Fall: She fell backwards on her head one month ago on her hard kitchen floor. She had no injuries following that fall.   Past Medical History:  Diagnosis Date   Accelerated hypertension  12/04/2015   AF (paroxysmal atrial fibrillation) ([place]) 06/12/2020   Anxiety    Bilateral impacted cerumen 02/12/2018   Breast cancer ([place])    right   CN VI palsy, left 03/13/2019   Cyst of joint of shoulder 12/20/2017   Dizziness 11/12/2020   DLBCL (diffuse large B cell lymphoma) ([place]) 02/29/2016   [place] of macula of both eyes 02/29/2016   Dry eyes, bilateral 02/29/2016   Dyslipidemia, goal LDL below 130 10/26/2016   Dysphagia 05/25/2015   Dyspnea 05/25/2015   Essential hypertension 05/27/2015   Excessive cerumen in ear canal, bilateral 04/26/2017   Frequent PAC (premature atrial contraction) 01/09/2021   Gastroesophageal reflux disease 06/12/2020   Glaucoma 06/12/2020   History of B-cell lymphoma 10/26/2016   History of breast cancer 06/12/2020   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 2013 rt radiation with balloon   Hyperlipidemia    Hypertension    Hyponatremia 05/25/2015   Formatting of this note might be different from the original. Mild and likely 2ry to diuretic use   Idiopathic peripheral neuropathy 10/26/2016   Insomnia 06/12/2020   Knee effusion, right 01/22/2016   Laryngeal edema 05/25/2015   Lymphoma ([place]) 05/05/2015   Microcalcification of left breast on mammogram 09/27/2017   Murmur, cardiac 07/11/2020   Osteopenia of left thigh 10/26/2016   Other visual disturbances 12/04/2015   PAF (paroxysmal atrial fibrillation) ([place]) 07/11/2020  PAT (paroxysmal atrial tachycardia) 01/09/2021   Poor balance 06/12/2020   Primary hypertension 11/12/2020   Primary open angle glaucoma of left eye, moderate stage 02/29/2016   Primary osteoarthritis of both knees 01/22/2016   Primary osteoarthritis of both shoulders 04/12/2017   Primary osteoarthritis of left knee 01/11/2017   Pseudophakia of both eyes 02/29/2016   PVD (posterior vitreous detachment), both eyes 02/29/2016   Radiation injury 05/27/2015   Right rotator  cuff tear arthropathy 01/03/2018   Sensorineural hearing loss (SNHL) of both ears 04/26/2017   Shortness of breath 02/13/2021   Varicose veins of left lower extremity 06/11/2020    Past Surgical History:  Procedure Laterality Date   BLADDER SUSPENSION     BREAST SURGERY Right 2008   lumpectomy   LAPAROSCOPIC HYSTERECTOMY     LYMPHADENECTOMY Right 2016    Family History  Problem Relation Age of Onset   Heart attack Father    Lymphoma Sister    Aneurysm Brother    Cancer Sister    Stroke Sister     Social History   Socioeconomic History   Marital status: Widowed    Spouse name: Not on file   Number of children: Not on file   Years of education: Not on file   Highest education level: Not on file  Occupational History   Occupation: Retired  Tobacco Use   Smoking status: Former   Smokeless tobacco: Never  Scientific laboratory technician Use: Never used  Substance and Sexual Activity   Alcohol use: Yes    Alcohol/week: 3.0 standard drinks of alcohol    Types: 3 Glasses of wine per week    Comment: wine 3 x per week   Drug use: Never   Sexual activity: Not Currently  Other Topics Concern   Not on file  Social History Narrative   Grew up in [place], moved to [place] in her 20's   Retired Editor, 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   Husband died when she was in her 29's   Son and 2 daughters- all local   Has 5 grandchildren and 3 great grandchildren   Lives by herself   No pets   Used to enjoy golf   Enjoys golf   Social Determinants of Health   Financial Resource Strain: Low Risk  (01/29/2022)   Overall Financial Resource Strain (CARDIA)    Difficulty of Paying Living Expenses: Not hard at all  Food Insecurity: No Food Insecurity (01/29/2022)   Hunger Vital Sign    Worried About Running Out of Food in the Last Year: Never true    [place] in the Last Year: Never true  Transportation Needs: No Transportation Needs (01/29/2022)   PRAPARE - Armed forces logistics/support/administrative officer (Medical): No    Lack of Transportation (Non-Medical): No  Physical Activity: Sufficiently Active (01/29/2022)   Exercise Vital Sign    Days of Exercise per Week: 7 days    Minutes of Exercise per Session: 60 min  Stress: No Stress Concern Present (01/29/2022)   [place]    Feeling of Stress : Not at all  Social Connections: Moderately Isolated (01/29/2022)   Social Connection and Isolation Panel [NHANES]    Frequency of Communication with Friends and Family: More than three times a week    Frequency of Social Gatherings with Friends and Family: Three times a week    Attends Religious Services:  More than 4 times per year    Active Member of Clubs or Organizations: No    Attends Archivist Meetings: Never    Marital Status: Widowed  Intimate Partner Violence: Not At Risk (01/29/2022)   Humiliation, Afraid, Rape, and Kick questionnaire    Fear of Current or Ex-Partner: No    Emotionally Abused: No    Physically Abused: No    Sexually Abused: No    Outpatient Medications Prior to Visit  Medication Sig Dispense Refill   aspirin EC 81 MG tablet Take 1 tablet (81 mg total) by mouth daily. Swallow whole. 30 tablet 12   b complex vitamins capsule Take 1 capsule by mouth daily.     clobetasol (TEMOVATE) 0.05 % external solution Apply topically 2 (two) times daily as needed.     cloNIDine (CATAPRES) 0.1 MG tablet Take 1 tablet (0.1 mg total) by mouth 2 (two) times daily. 180 tablet 1   famotidine (PEPCID) 20 MG tablet Take 1 tablet (20 mg total) by mouth 2 (two) times daily. 180 tablet 1   FLUoxetine (PROZAC) 10 MG capsule Take 1 cap by mouth once daily for 1 week, then increase to 2 tabs on week two. 60 capsule 0   Guselkumab (TREMFYA [place]) Inject into the skin.     LORazepam (ATIVAN) 1 MG tablet Take 1 tablet (1 mg total) by mouth at bedtime. 90 tablet 0   meloxicam (MOBIC) 7.5 MG tablet Take 1  tablet (7.5 mg total) by mouth daily. 30 tablet 3   OVER THE COUNTER MEDICATION Once PRN. Preservative Free Moisture Eye drops.     ROCKLATAN 0.02-0.005 % SOLN Apply to eye.     verapamil (CALAN-SR) 180 MG CR tablet Take 1 tablet (180 mg total) by mouth at bedtime. 90 tablet 3   No facility-administered medications prior to visit.    Allergies  Allergen Reactions   Bimatoprost Other (See Comments)    toxicity   Levofloxacin Diarrhea   Penicillins Itching   Shellfish Allergy Other (See Comments)   Statins Other (See Comments)    Myalgias - "all statins"  Per pt   Atorvastatin Other (See Comments)   Brimonidine Other (See Comments)   Dorzolamide Hcl-Timolol Mal Other (See Comments)   Lovastatin Other (See Comments)   Perindopril Other (See Comments)    Review of Systems  Musculoskeletal:  Positive for falls (fall one month ago; no injuries).  Neurological:  Positive for speech change (slurred speech).  Psychiatric/Behavioral:  Positive for memory loss.        Objective:    Physical Exam  There were no vitals taken for this visit. Wt Readings from Last 3 Encounters:  12/03/22 161 lb (73 kg)  11/26/22 161 lb (73 kg)  11/12/22 166 lb 9.6 oz (75.6 kg)    Gen: Awake, alert, no acute distress Resp: Breathing is even and non-labored Psych: calm/pleasant demeanor Neuro: Alert and Oriented x 3, + facial symmetry, speech is clear.     Assessment & Plan:  TIA (transient ischemic attack) Assessment & Plan: New.  No current deficits.  Will initiate stroke work up as below.  Add aspirin '81mg'$  for stroke prevention. Will arrange nurse visit for EKG and blood work.  She is advised to call 911 if she develops recurrent stroke like symptoms.  Pt and daughter verbalize understanding.   Orders: -     ECHOCARDIOGRAM COMPLETE; Future -     CT HEAD WO CONTRAST (5MM); Future -  US Carotid Bilateral; Future -     Lipid panel; Future -     Hemoglobin A1c; Future     I discussed  the assessment and treatment plan with the patient. The patient was provided an opportunity to ask questions and all were answered. The patient agreed with the plan and demonstrated an understanding of the instructions.   The patient was advised to call back or seek an in-person evaluation if the symptoms worsen or if the condition fails to improve as anticipated.  Nance Pear, NP [facility] (phone) [phone] (fax)  [place]    I,[name],acting as a scribe for Nance Pear, NP.,have documented all relevant documentation on the behalf of Nance Pear, NP,as directed by  Nance Pear, NP while in the presence of Nance Pear, NP.

## 2022-12-10 NOTE — Assessment & Plan Note (Signed)
New.  No current deficits.  Will initiate stroke work up as below.  Add aspirin '81mg'$  for stroke prevention. Will arrange nurse visit for EKG and blood work.  She is advised to call 911 if she develops recurrent stroke like symptoms.  Pt and daughter verbalize understanding.

## 2022-12-10 NOTE — Telephone Encounter (Addendum)
Can you please contact pt to schedule a nurse visit on Monday to complete lab work and EKG? Dx TIA? Needs in person office visit with me in 1 month.  Also, please let her know that I ended up putting in a CT order rather than an MRI.

## 2022-12-13 ENCOUNTER — Ambulatory Visit (INDEPENDENT_AMBULATORY_CARE_PROVIDER_SITE_OTHER): Payer: PPO | Admitting: Family

## 2022-12-13 ENCOUNTER — Other Ambulatory Visit (INDEPENDENT_AMBULATORY_CARE_PROVIDER_SITE_OTHER): Payer: PPO

## 2022-12-13 DIAGNOSIS — Z8673 Personal history of transient ischemic attack (TIA), and cerebral infarction without residual deficits: Secondary | ICD-10-CM

## 2022-12-13 DIAGNOSIS — G459 Transient cerebral ischemic attack, unspecified: Secondary | ICD-10-CM | POA: Diagnosis not present

## 2022-12-13 LAB — HEMOGLOBIN A1C: Hgb A1c MFr Bld: 7.2 % — ABNORMAL HIGH (ref 4.6–6.5)

## 2022-12-13 LAB — LIPID PANEL
Cholesterol: 224 mg/dL — ABNORMAL HIGH (ref 0–200)
HDL: 44.9 mg/dL (ref >39.00)
LDL Cholesterol: 148 mg/dL — ABNORMAL HIGH (ref 0–99)
NonHDL: 179.37
Total CHOL/HDL Ratio: 5
Triglycerides: 159 mg/dL — ABNORMAL HIGH (ref 0.0–149.0)
VLDL: 31.8 mg/dL (ref 0.0–40.0)

## 2022-12-14 ENCOUNTER — Other Ambulatory Visit: Payer: Self-pay | Admitting: Family

## 2022-12-14 NOTE — Telephone Encounter (Signed)
Requesting: lorazepam '1mg'$   Contract: 06/11/20 UDS: 11/06/21 Last Visit: 12/10/22 Next Visit: 01/04/23 Last Refill: 09/16/22 #90 and 0RF  Please Advise

## 2022-12-14 NOTE — Progress Notes (Signed)
EKG is reviewed.  Notes NSR, appears unchanged compared to previous EKG.  I have also forwarded to her cardiologist for review.

## 2022-12-15 ENCOUNTER — Telehealth: Payer: Self-pay | Admitting: Family

## 2022-12-15 NOTE — Telephone Encounter (Signed)
Patient advised of results.   She declines referral to the lipid clinic at this time.

## 2022-12-15 NOTE — Telephone Encounter (Signed)
A1C is acceptable for her age at 7.2.    Her cholesterol is elevated.  I know that she is not able to tolerate statins.  Given her recent TIA symptoms, she would likely be a candidate for an injection for cholesterol which would be prescribed by the lipid clinic at cardiology. Would she like for me to refer her?

## 2022-12-18 ENCOUNTER — Ambulatory Visit (HOSPITAL_BASED_OUTPATIENT_CLINIC_OR_DEPARTMENT_OTHER)
Admission: RE | Admit: 2022-12-18 | Discharge: 2022-12-18 | Disposition: A | Discharge status: Home / Self Care | Payer: PPO | Source: Ambulatory Visit | Attending: Family | Admitting: Family

## 2022-12-18 DIAGNOSIS — G238 Other specified degenerative diseases of basal ganglia: Secondary | ICD-10-CM | POA: Diagnosis not present

## 2022-12-18 DIAGNOSIS — I672 Cerebral atherosclerosis: Secondary | ICD-10-CM | POA: Diagnosis not present

## 2022-12-18 DIAGNOSIS — G459 Transient cerebral ischemic attack, unspecified: Secondary | ICD-10-CM | POA: Diagnosis not present

## 2022-12-19 ENCOUNTER — Encounter: Payer: Self-pay | Admitting: Family

## 2022-12-19 ENCOUNTER — Telehealth: Payer: Self-pay | Admitting: Family

## 2022-12-19 DIAGNOSIS — G459 Transient cerebral ischemic attack, unspecified: Secondary | ICD-10-CM

## 2022-12-19 DIAGNOSIS — I6529 Occlusion and stenosis of unspecified carotid artery: Secondary | ICD-10-CM | POA: Insufficient documentation

## 2022-12-19 NOTE — Telephone Encounter (Addendum)
Please advise pt that her CT scan does not show any sign of previous major stroke.  It dose show some calcification of one of the arteries in her brain.  I would recommend that we have her complete an MRI of her brain to further evaluate the blood flow.    Also, there is some narrowing of her carotid arteries but no severe enough to recommend any surgery.  Please continue aspirin '81mg'$ .

## 2022-12-20 ENCOUNTER — Other Ambulatory Visit: Payer: Self-pay | Admitting: Family

## 2022-12-20 NOTE — Telephone Encounter (Signed)
Patient notified of results and new orders. She said she will discuss with her daughter first "not sure if she will like to continue to do any more testing"

## 2022-12-21 ENCOUNTER — Telehealth: Payer: Self-pay

## 2022-12-21 NOTE — Telephone Encounter (Signed)
Patient called to let us know she will cancel appointment for echocardiogram. She is also declining Head MRI at this time. "She is 89 and does not want to continue to have all the big test and going to specialists. Will continue to see pcp as needed"

## 2022-12-30 ENCOUNTER — Ambulatory Visit (HOSPITAL_BASED_OUTPATIENT_CLINIC_OR_DEPARTMENT_OTHER): Payer: PPO

## 2023-01-04 ENCOUNTER — Encounter: Payer: Self-pay | Admitting: Family

## 2023-01-04 ENCOUNTER — Telehealth (INDEPENDENT_AMBULATORY_CARE_PROVIDER_SITE_OTHER): Payer: PPO | Admitting: Family

## 2023-01-04 VITALS — BP 158/79 | HR 68 | Wt 166.0 lb

## 2023-01-04 DIAGNOSIS — I10 Essential (primary) hypertension: Secondary | ICD-10-CM | POA: Diagnosis not present

## 2023-01-04 DIAGNOSIS — R5383 Other fatigue: Secondary | ICD-10-CM

## 2023-01-04 DIAGNOSIS — E782 Mixed hyperlipidemia: Secondary | ICD-10-CM | POA: Diagnosis not present

## 2023-01-04 DIAGNOSIS — G459 Transient cerebral ischemic attack, unspecified: Secondary | ICD-10-CM | POA: Diagnosis not present

## 2023-01-04 DIAGNOSIS — I6523 Occlusion and stenosis of bilateral carotid arteries: Secondary | ICD-10-CM

## 2023-01-04 NOTE — Patient Instructions (Signed)
Please decrease prozac to 51m (one cap once daily).

## 2023-01-04 NOTE — Assessment & Plan Note (Signed)
BP Readings from Last 3 Encounters:  01/04/23 (!) 161/87  12/03/22 (!) 114/59  11/26/22 (!) 170/80

## 2023-01-04 NOTE — Progress Notes (Shared)
MyChart Video Visit    Virtual Visit via Video Note   This visit type was conducted due to national recommendations for restrictions regarding the COVID-19 Pandemic (e.g. social distancing) in an effort to limit this patient's exposure and mitigate transmission in our community. This patient is at least at moderate risk for complications without adequate follow up. This format is felt to be most appropriate for this patient at this time. Physical exam was limited by quality of the video and audio technology used for the visit. CMA was able to get the patient set up on a video visit.  Patient location: Home Patient and provider in visit Provider location: Office  I discussed the limitations of evaluation and management by telemedicine and the availability of in person appointments. The patient expressed understanding and agreed to proceed.  Visit Date: 01/04/2023  Today's healthcare provider: Nance Pear, NP     Subjective:    Patient ID: Carla Chandler, female    DOB: 1926-06-14, 89 y.o.   MRN: [id number]  Chief Complaint  Patient presents with   1 month follow up    HPI Patient is in today for a video follow up visit. She is accompanied by her daughter.  Fatigue: She complains of fatigue for the past couple of weeks. She reports she wakes up and eats and feels like she can go back to bed after. She doubled her Prozac to 20 mg 1 month ago and is interested in going back to 10 mg to help manage her symptoms.   Blood pressure: She is monitoring her blood pressure and reports its mostly stable. Her blood pressure measured 148/71 today. She notes her blood pressure typically measure 10 points higher than the blood pressure cuff in office.  BP Readings from Last 3 Encounters:  01/04/23 (!) 158/79  12/03/22 (!) 114/59  11/26/22 (!) 170/80   Pulse Readings from Last 3 Encounters:  01/04/23 68  12/03/22 70  11/26/22 68   TIA: She was worried about TIA's during  last visit. She had completed screenings since her last visit and found no cancer in her brain.   Past Medical History:  Diagnosis Date   Accelerated hypertension 12/04/2015   AF (paroxysmal atrial fibrillation) ([place]) 06/12/2020   Anxiety    Bilateral impacted cerumen 02/12/2018   Breast cancer ([place])    right   Carotid artery stenosis    CN VI palsy, left 03/13/2019   Cyst of joint of shoulder 12/20/2017   Dizziness 11/12/2020   DLBCL (diffuse large B cell lymphoma) ([place]) 02/29/2016   [place] of macula of both eyes 02/29/2016   Dry eyes, bilateral 02/29/2016   Dyslipidemia, goal LDL below 130 10/26/2016   Dysphagia 05/25/2015   Dyspnea 05/25/2015   Essential hypertension 05/27/2015   Excessive cerumen in ear canal, bilateral 04/26/2017   Frequent PAC (premature atrial contraction) 01/09/2021   Gastroesophageal reflux disease 06/12/2020   Glaucoma 06/12/2020   History of B-cell lymphoma 10/26/2016   History of breast cancer 06/12/2020   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 2013 rt radiation with balloon   Hyperlipidemia    Hypertension    Hyponatremia 05/25/2015   Formatting of this note might be different from the original. Mild and likely 2ry to diuretic use   Idiopathic peripheral neuropathy 10/26/2016   Insomnia 06/12/2020   Knee effusion, right 01/22/2016   Laryngeal edema 05/25/2015  Lymphoma ([place]) 05/05/2015   Microcalcification of left breast on mammogram 09/27/2017   Murmur, cardiac 07/11/2020   Osteopenia of left thigh 10/26/2016   Other visual disturbances 12/04/2015   PAF (paroxysmal atrial fibrillation) ([place]) 07/11/2020   PAT (paroxysmal atrial tachycardia) 01/09/2021   Poor balance 06/12/2020   Primary hypertension 11/12/2020   Primary open angle glaucoma of left eye, moderate stage 02/29/2016   Primary osteoarthritis of both knees 01/22/2016    Primary osteoarthritis of both shoulders 04/12/2017   Primary osteoarthritis of left knee 01/11/2017   Pseudophakia of both eyes 02/29/2016   PVD (posterior vitreous detachment), both eyes 02/29/2016   Radiation injury 05/27/2015   Right rotator cuff tear arthropathy 01/03/2018   Sensorineural hearing loss (SNHL) of both ears 04/26/2017   Shortness of breath 02/13/2021   Varicose veins of left lower extremity 06/11/2020    Past Surgical History:  Procedure Laterality Date   BLADDER SUSPENSION     BREAST SURGERY Right 2008   lumpectomy   LAPAROSCOPIC HYSTERECTOMY     LYMPHADENECTOMY Right 2016    Family History  Problem Relation Age of Onset   Heart attack Father    Lymphoma Sister    Aneurysm Brother    Cancer Sister    Stroke Sister     Social History   Socioeconomic History   Marital status: Widowed    Spouse name: Not on file   Number of children: Not on file   Years of education: Not on file   Highest education level: Not on file  Occupational History   Occupation: Retired  Tobacco Use   Smoking status: Former   Smokeless tobacco: Never  Scientific laboratory technician Use: Never used  Substance and Sexual Activity   Alcohol use: Yes    Alcohol/week: 3.0 standard drinks of alcohol    Types: 3 Glasses of wine per week    Comment: wine 3 x per week   Drug use: Never   Sexual activity: Not Currently  Other Topics Concern   Not on file  Social History Narrative   Grew up in [place], moved to [place] in her 20's   Retired Editor, 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   Husband died when she was in her 70's   Son and 2 daughters- all local   Has 5 grandchildren and 3 great grandchildren   Lives by herself   No pets   Used to enjoy golf   Enjoys golf   Social Determinants of Health   Financial Resource Strain: Low Risk  (01/29/2022)   Overall Financial Resource Strain (CARDIA)    Difficulty of Paying Living Expenses: Not hard at all  Food Insecurity: No  Food Insecurity (01/29/2022)   Hunger Vital Sign    Worried About Running Out of Food in the Last Year: Never true    [place] in the Last Year: Never true  Transportation Needs: No Transportation Needs (01/29/2022)   PRAPARE - Hydrologist (Medical): No    Lack of Transportation (Non-Medical): No  Physical Activity: Sufficiently Active (01/29/2022)   Exercise Vital Sign    Days of Exercise per Week: 7 days    Minutes of Exercise per Session: 60 min  Stress: No Stress Concern Present (01/29/2022)   [place]    Feeling of Stress : Not at all  Social Connections: Moderately Isolated (01/29/2022)   Social Connection and  Isolation Panel [NHANES]    Frequency of Communication with Friends and Family: More than three times a week    Frequency of Social Gatherings with Friends and Family: Three times a week    Attends Religious Services: More than 4 times per year    Active Member of Clubs or Organizations: No    Attends Archivist Meetings: Never    Marital Status: Widowed  Intimate Partner Violence: Not At Risk (01/29/2022)   Humiliation, Afraid, Rape, and Kick questionnaire    Fear of Current or Ex-Partner: No    Emotionally Abused: No    Physically Abused: No    Sexually Abused: No    Outpatient Medications Prior to Visit  Medication Sig Dispense Refill   aspirin EC 81 MG tablet Take 1 tablet (81 mg total) by mouth daily. Swallow whole. 30 tablet 12   b complex vitamins capsule Take 1 capsule by mouth daily.     clobetasol (TEMOVATE) 0.05 % external solution Apply topically 2 (two) times daily as needed.     cloNIDine (CATAPRES) 0.1 MG tablet Take 1 tablet (0.1 mg total) by mouth 2 (two) times daily. 180 tablet 1   famotidine (PEPCID) 20 MG tablet Take 1 tablet (20 mg total) by mouth 2 (two) times daily. 180 tablet 1   FLUoxetine (PROZAC) 10 MG capsule Take 1 cap by mouth once  daily for 1 week, then increase to 2 tabs on week two. 60 capsule 0   Guselkumab (TREMFYA [place]) Inject into the skin.     LORazepam (ATIVAN) 1 MG tablet TAKE 1 TABLET(1 MG) BY MOUTH AT BEDTIME 90 tablet 1   meloxicam (MOBIC) 7.5 MG tablet Take 1 tablet by mouth once daily 30 tablet 0   OVER THE COUNTER MEDICATION Once PRN. Preservative Free Moisture Eye drops.     ROCKLATAN 0.02-0.005 % SOLN Apply to eye.     verapamil (CALAN-SR) 180 MG CR tablet Take 1 tablet (180 mg total) by mouth at bedtime. 90 tablet 3   No facility-administered medications prior to visit.    Allergies  Allergen Reactions   Bimatoprost Other (See Comments)    toxicity   Levofloxacin Diarrhea   Penicillins Itching   Shellfish Allergy Other (See Comments)   Statins Other (See Comments)    Myalgias - "all statins"  Per pt   Atorvastatin Other (See Comments)   Brimonidine Other (See Comments)   Dorzolamide Hcl-Timolol Mal Other (See Comments)   Lovastatin Other (See Comments)   Perindopril Other (See Comments)    Review of Systems  Constitutional:  Positive for malaise/fatigue.       Objective:    Physical Exam Constitutional:      General: She is not in acute distress.    Appearance: Normal appearance. She is well-developed.  HENT:     Head: Normocephalic and atraumatic.     Right Ear: External ear normal.     Left Ear: External ear normal.  Eyes:     General: No scleral icterus. Neck:     Thyroid: No thyromegaly.  Cardiovascular:     Rate and Rhythm: Normal rate.  Pulmonary:     Effort: Pulmonary effort is normal.  Neurological:     Mental Status: She is alert and oriented to person, place, and time.  Psychiatric:        Mood and Affect: Mood normal.        Behavior: Behavior normal.        Thought Content: Thought  content normal.        Judgment: Judgment normal.     BP (!) 158/79   Pulse 68   Wt 166 lb (75.3 kg)   BMI 27.62 kg/m  Wt Readings from Last 3 Encounters:  01/04/23 166 lb  (75.3 kg)  12/03/22 161 lb (73 kg)  11/26/22 161 lb (73 kg)       Assessment & Plan:  TIA (transient ischemic attack) Assessment & Plan: She has declined additional work up due to her age and not wishing to pursue any procedures.  Will continue secondary prevention with aspirin.   Essential hypertension Assessment & Plan: BP Readings from Last 3 Encounters:  01/04/23 (!) 158/79  12/03/22 (!) 114/59  11/26/22 (!) 170/80   BP above goal today, but it was low last month. I don't want her falling due to dizziness.  Will not increase BP meds at this time due to her advanced age and labile pressure.    Other fatigue Assessment & Plan: Fatigue seems to have aligned with switching from citalopram to prozac.  Mood is stable. Will decrease prozac from 62m to 157m  Discussed if her symptoms improve, continue at the 1033mIf fatigue does not improve with this change, then I would recommend in office visit with labs/exam.     Mixed hyperlipidemia Assessment & Plan: Lab Results  Component Value Date   CHOL 224 (H) 12/13/2022   HDL 44.90 12/13/2022   LDLCALC 148 (H) 12/13/2022   TRIG 159.0 (H) 12/13/2022   CHOLHDL 5 12/13/2022   Plan to repeat lipid panel next in office visit. Unfortunately, she is intolerant to statins.    Bilateral carotid artery stenosis Assessment & Plan: 50-69% bilaterally.  Continue aspirin. States she would not pursue surgical intervention even if stenosis was >80%.       I discussed the assessment and treatment plan with the patient. The patient was provided an opportunity to ask questions and all were answered. The patient agreed with the plan and demonstrated an understanding of the instructions.   The patient was advised to call back or seek an in-person evaluation if the symptoms worsen or if the condition fails to improve as anticipated.  MelNance PearP [facility]) [phone])  [place],[name],acting as a scribe for MelNance PearP.,have documented all relevant documentation on the behalf of MelNance PearP,as directed by  MelNance PearP while in the presence of MelNance PearP.

## 2023-01-05 NOTE — Assessment & Plan Note (Signed)
50-69% bilaterally.  Continue aspirin. States she would not pursue surgical intervention even if stenosis was >80%.

## 2023-01-05 NOTE — Assessment & Plan Note (Signed)
Fatigue seems to have aligned with switching from citalopram to prozac.  Mood is stable. Will decrease prozac from 50m to 153m  Discussed if her symptoms improve, continue at the 1076mIf fatigue does not improve with this change, then I would recommend in office visit with labs/exam.

## 2023-01-05 NOTE — Assessment & Plan Note (Addendum)
Lab Results  Component Value Date   CHOL 224 (H) 12/13/2022   HDL 44.90 12/13/2022   LDLCALC 148 (H) 12/13/2022   TRIG 159.0 (H) 12/13/2022   CHOLHDL 5 12/13/2022   Plan to repeat lipid panel next in office visit. Unfortunately, she is intolerant to statins.

## 2023-01-05 NOTE — Assessment & Plan Note (Addendum)
She has declined additional work up due to her age and not wishing to pursue any procedures.  Will continue secondary prevention with aspirin.

## 2023-01-05 NOTE — Progress Notes (Signed)
This encounter was created in error - please disregard.

## 2023-01-11 ENCOUNTER — Telehealth: Payer: Self-pay | Admitting: Family

## 2023-01-11 MED ORDER — CITALOPRAM HYDROBROMIDE 20 MG PO TABS: 20.0000 mg | ORAL_TABLET | Freq: Every day | ORAL | 1 refills | Status: DC

## 2023-01-11 NOTE — Telephone Encounter (Signed)
Patient reports she is having trouble sleeping since citalopram replaced with prozac.  Patient will like to to try something else or go back to citalopram, she has not been able to sleep.

## 2023-01-11 NOTE — Telephone Encounter (Signed)
Patient advised of change and to call us if she needs to report mood changes or increased anxiety

## 2023-01-11 NOTE — Telephone Encounter (Signed)
D/c prozac, restart citalopram 58m. Call if she has any issues with mood with this transition.

## 2023-01-11 NOTE — Telephone Encounter (Signed)
Pt would like callback to discuss issues she is having with Famotidine.

## 2023-01-14 ENCOUNTER — Other Ambulatory Visit: Payer: Self-pay | Admitting: Family

## 2023-01-21 ENCOUNTER — Telehealth: Payer: Self-pay | Admitting: Family

## 2023-01-21 NOTE — Telephone Encounter (Signed)
Contacted [name] to schedule their annual wellness visit. Appointment made for 02/02/2023.  Sherol Dade; [facility] Direct Dial: [phone]

## 2023-01-24 ENCOUNTER — Other Ambulatory Visit: Payer: Self-pay | Admitting: Family

## 2023-02-03 ENCOUNTER — Other Ambulatory Visit (HOSPITAL_BASED_OUTPATIENT_CLINIC_OR_DEPARTMENT_OTHER): Payer: Self-pay

## 2023-02-20 ENCOUNTER — Other Ambulatory Visit: Payer: Self-pay | Admitting: Family

## 2023-03-18 ENCOUNTER — Other Ambulatory Visit: Payer: Self-pay | Admitting: Cardiology

## 2023-03-21 ENCOUNTER — Telehealth: Payer: Self-pay | Admitting: Cardiology

## 2023-03-21 NOTE — Telephone Encounter (Signed)
Unsure reason why she was called. She thinks it was regarding verapamil but states it is all taken care of.  Nothing further at this time.

## 2023-03-21 NOTE — Telephone Encounter (Signed)
Patient states she was returning call. Please advise  ?

## 2023-03-25 ENCOUNTER — Other Ambulatory Visit: Payer: Self-pay | Admitting: Family

## 2023-04-25 ENCOUNTER — Other Ambulatory Visit: Payer: Self-pay | Admitting: Family

## 2023-05-09 ENCOUNTER — Encounter: Payer: Self-pay | Admitting: Family Medicine

## 2023-05-09 ENCOUNTER — Ambulatory Visit (INDEPENDENT_AMBULATORY_CARE_PROVIDER_SITE_OTHER): Payer: PPO | Admitting: Family Medicine

## 2023-05-09 VITALS — BP 110/72 | HR 91 | Temp 98.4°F | Resp 18 | Ht 65.0 in | Wt 160.8 lb

## 2023-05-09 DIAGNOSIS — R82998 Other abnormal findings in urine: Secondary | ICD-10-CM | POA: Diagnosis not present

## 2023-05-09 DIAGNOSIS — R590 Localized enlarged lymph nodes: Secondary | ICD-10-CM | POA: Diagnosis not present

## 2023-05-09 DIAGNOSIS — R195 Other fecal abnormalities: Secondary | ICD-10-CM | POA: Diagnosis not present

## 2023-05-09 DIAGNOSIS — R509 Fever, unspecified: Secondary | ICD-10-CM | POA: Diagnosis not present

## 2023-05-09 LAB — CBC WITH DIFFERENTIAL/PLATELET
Basophils Absolute: 0 10*3/uL (ref 0.0–0.1)
Basophils Relative: 0.3 % (ref 0.0–3.0)
Eosinophils Absolute: 0 10*3/uL (ref 0.0–0.7)
Eosinophils Relative: 0.4 % (ref 0.0–5.0)
HCT: 36.8 % (ref 36.0–46.0)
Hemoglobin: 12.1 g/dL (ref 12.0–15.0)
Lymphocytes Relative: 9.2 % — ABNORMAL LOW (ref 12.0–46.0)
Lymphs Abs: 0.9 10*3/uL (ref 0.7–4.0)
MCHC: 32.8 g/dL (ref 30.0–36.0)
MCV: 100.6 fl — ABNORMAL HIGH (ref 78.0–100.0)
Monocytes Absolute: 1 10*3/uL (ref 0.1–1.0)
Monocytes Relative: 10.5 % (ref 3.0–12.0)
Neutro Abs: 7.7 10*3/uL (ref 1.4–7.7)
Neutrophils Relative %: 79.6 % — ABNORMAL HIGH (ref 43.0–77.0)
Platelets: 148 10*3/uL — ABNORMAL LOW (ref 150.0–400.0)
RBC: 3.65 Mil/uL — ABNORMAL LOW (ref 3.87–5.11)
RDW: 13.2 % (ref 11.5–15.5)
WBC: 9.6 10*3/uL (ref 4.0–10.5)

## 2023-05-09 LAB — POC URINALSYSI DIPSTICK (AUTOMATED)
Blood, UA: NEGATIVE
Glucose, UA: NEGATIVE
Ketones, UA: 15
Nitrite, UA: NEGATIVE
Protein, UA: POSITIVE — AB
Spec Grav, UA: 1.02 (ref 1.010–1.025)
Urobilinogen, UA: 0.2 E.U./dL
pH, UA: 6 (ref 5.0–8.0)

## 2023-05-09 LAB — COMPREHENSIVE METABOLIC PANEL
ALT: 10 U/L (ref 0–35)
AST: 11 U/L (ref 0–37)
Albumin: 3.9 g/dL (ref 3.5–5.2)
Alkaline Phosphatase: 47 U/L (ref 39–117)
BUN: 23 mg/dL (ref 6–23)
CO2: 29 mEq/L (ref 19–32)
Calcium: 9.2 mg/dL (ref 8.4–10.5)
Chloride: 99 mEq/L (ref 96–112)
Creatinine, Ser: 0.65 mg/dL (ref 0.40–1.20)
GFR: 74.84 mL/min (ref >60.00)
Glucose, Bld: 70 mg/dL (ref 70–99)
Potassium: 4.6 mEq/L (ref 3.5–5.1)
Sodium: 136 mEq/L (ref 135–145)
Total Bilirubin: 0.6 mg/dL (ref 0.2–1.2)
Total Protein: 7 g/dL (ref 6.0–8.3)

## 2023-05-09 MED ORDER — SULFAMETHOXAZOLE-TRIMETHOPRIM 800-160 MG PO TABS
1.0000 | ORAL_TABLET | Freq: Two times a day (BID) | ORAL | 0 refills | Status: DC
Start: 2023-05-09 — End: 2023-07-07

## 2023-05-09 NOTE — Assessment & Plan Note (Signed)
Low grade but he has been taking tylenol regularly  Check labs and urine

## 2023-05-09 NOTE — Assessment & Plan Note (Signed)
Pt with hx lymphoma Check labs  Start empiric abx---labs and  check urine

## 2023-05-09 NOTE — Progress Notes (Unsigned)
4  Established Patient Office Visit  Subjective   Patient ID: Carla Chandler, female    DOB: 13-Jul-1926  Age: 89 y.o. MRN: [id number]  Chief Complaint  Patient presents with   Fever    Pt states having fever for 2 days, pt states taking tylenol. Pt states no congestion and states has cough all the time, no sore throat.    GI Problem    Pt states having leakage and mucus since Friday. Pt states having pain. No diarrhea.     HPI Pt here with her daugher c/o weakness , fever x 2 days.  She has a long hx constipation and has had anal leakage more recently with increase weakness. She also c/o lymphadenopathy in her neck.  She is a little concerned about reoccurance of her lymphoma.   Patient Active Problem List   Diagnosis Date Noted   Fever 05/09/2023   Cervical lymphadenopathy 05/09/2023   Loose stools 05/09/2023   Leukocytes in urine 05/09/2023   Carotid artery stenosis 12/19/2022   TIA (transient ischemic attack) 12/10/2022   Osteoarthritis 08/27/2022   Cough 05/21/2022   Nonintractable headache 05/07/2022   Atrial tachycardia 06/22/2021   Fatigue 06/22/2021   Other fatigue 06/22/2021   Allergic rhinitis 04/10/2021   Skin lesion 04/10/2021   Osteoarthritis of right knee 04/10/2021   Shortness of breath 02/13/2021   Frequent PAC (premature atrial contraction) 01/09/2021   PAT (paroxysmal atrial tachycardia) 01/09/2021   Dizziness 11/12/2020   Hyperlipidemia    Breast cancer (HCC)    Anxiety and depression    PAF (paroxysmal atrial fibrillation) (HCC) 07/11/2020   Murmur, cardiac 07/11/2020   AF (paroxysmal atrial fibrillation) (HCC) 06/12/2020   Insomnia 06/12/2020   Poor balance 06/12/2020   Gastroesophageal reflux disease 06/12/2020   Glaucoma 06/12/2020   History of breast cancer 06/12/2020   Varicose veins of left lower extremity 06/11/2020   CN VI palsy, left 03/13/2019   Right rotator cuff tear arthropathy 01/03/2018   Cyst of joint of shoulder 12/20/2017    Microcalcification of left breast on mammogram 09/27/2017   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) 08/17/2017   Sensorineural hearing loss (SNHL) of both ears 04/26/2017   Primary osteoarthritis of both shoulders 04/12/2017   Primary osteoarthritis of left knee 01/11/2017   History of B-cell lymphoma 10/26/2016   Idiopathic peripheral neuropathy 10/26/2016   Osteopenia of left thigh 10/26/2016   Dyslipidemia, goal LDL below 130 10/26/2016   DLBCL (diffuse large B cell lymphoma) (HCC) 02/29/2016   Dry eyes, bilateral 02/29/2016   Primary open angle glaucoma of left eye, moderate stage 02/29/2016   Pseudophakia of both eyes 02/29/2016   PVD (posterior vitreous detachment), both eyes 02/29/2016   Drusen of macula of both eyes 02/29/2016   Knee effusion, right 01/22/2016   Primary osteoarthritis of both knees 01/22/2016   Accelerated hypertension 12/04/2015   Other visual disturbances 12/04/2015   Essential hypertension 05/27/2015   Radiation injury 05/27/2015   Dysphagia 05/25/2015   Dyspnea 05/25/2015   Hyponatremia 05/25/2015   Laryngeal edema 05/25/2015   Lymphoma (HCC) 05/05/2015   Past Medical History:  Diagnosis Date   Accelerated hypertension 12/04/2015   AF (paroxysmal atrial fibrillation) (HCC) 06/12/2020   Anxiety    Bilateral impacted cerumen 02/12/2018   Breast cancer (HCC)    right   Carotid artery stenosis    CN VI palsy, left 03/13/2019   Cyst of joint of shoulder 12/20/2017   Dizziness 11/12/2020  DLBCL (diffuse large B cell lymphoma) (HCC) 02/29/2016   Drusen of macula of both eyes 02/29/2016   Dry eyes, bilateral 02/29/2016   Dyslipidemia, goal LDL below 130 10/26/2016   Dysphagia 05/25/2015   Dyspnea 05/25/2015   Essential hypertension 05/27/2015   Excessive cerumen in ear canal, bilateral 04/26/2017   Frequent PAC (premature atrial contraction) 01/09/2021   Gastroesophageal reflux disease 06/12/2020   Glaucoma 06/12/2020    History of B-cell lymphoma 10/26/2016   History of breast cancer 06/12/2020   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 2013 rt radiation with balloon   Hyperlipidemia    Hypertension    Hyponatremia 05/25/2015   Formatting of this note might be different from the original. Mild and likely 2ry to diuretic use   Idiopathic peripheral neuropathy 10/26/2016   Insomnia 06/12/2020   Knee effusion, right 01/22/2016   Laryngeal edema 05/25/2015   Lymphoma (HCC) 05/05/2015   Microcalcification of left breast on mammogram 09/27/2017   Murmur, cardiac 07/11/2020   Osteopenia of left thigh 10/26/2016   Other visual disturbances 12/04/2015   PAF (paroxysmal atrial fibrillation) (HCC) 07/11/2020   PAT (paroxysmal atrial tachycardia) 01/09/2021   Poor balance 06/12/2020   Primary hypertension 11/12/2020   Primary open angle glaucoma of left eye, moderate stage 02/29/2016   Primary osteoarthritis of both knees 01/22/2016   Primary osteoarthritis of both shoulders 04/12/2017   Primary osteoarthritis of left knee 01/11/2017   Pseudophakia of both eyes 02/29/2016   PVD (posterior vitreous detachment), both eyes 02/29/2016   Radiation injury 05/27/2015   Right rotator cuff tear arthropathy 01/03/2018   Sensorineural hearing loss (SNHL) of both ears 04/26/2017   Shortness of breath 02/13/2021   Varicose veins of left lower extremity 06/11/2020   Past Surgical History:  Procedure Laterality Date   BLADDER SUSPENSION     BREAST SURGERY Right 2008   lumpectomy   LAPAROSCOPIC HYSTERECTOMY     LYMPHADENECTOMY Right 2016   Social History   Tobacco Use   Smoking status: Former   Smokeless tobacco: Never  Vaping Use   Vaping Use: Never used  Substance Use Topics   Alcohol use: Yes    Alcohol/week: 3.0 standard drinks of alcohol    Types: 3 Glasses of wine per week    Comment: wine 3 x  per week   Drug use: Never   Social History   Socioeconomic History   Marital status: Widowed    Spouse name: Not on file   Number of children: Not on file   Years of education: Not on file   Highest education level: Not on file  Occupational History   Occupation: Retired  Tobacco Use   Smoking status: Former   Smokeless tobacco: Never  Building services engineer Use: Never used  Substance and Sexual Activity   Alcohol use: Yes    Alcohol/week: 3.0 standard drinks of alcohol    Types: 3 Glasses of wine per week    Comment: wine 3 x per week   Drug use: Never   Sexual activity: Not Currently  Other Topics Concern   Not on file  Social History Narrative   Grew up in [place], moved to [place] in her 20's   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   Husband died when she was in her 50's   Son and 2 daughters- all local  Has 5 grandchildren and 3 great grandchildren   Lives by herself   No pets   Used to enjoy golf   Enjoys golf   Social Determinants of Health   Financial Resource Strain: Low Risk  (01/29/2022)   Overall Financial Resource Strain (CARDIA)    Difficulty of Paying Living Expenses: Not hard at all  Food Insecurity: No Food Insecurity (01/29/2022)   Hunger Vital Sign    Worried About Running Out of Food in the Last Year: Never true    Ran Out of Food in the Last Year: Never true  Transportation Needs: No Transportation Needs (01/29/2022)   PRAPARE - Administrator, Civil Service (Medical): No    Lack of Transportation (Non-Medical): No  Physical Activity: Sufficiently Active (01/29/2022)   Exercise Vital Sign    Days of Exercise per Week: 7 days    Minutes of Exercise per Session: 60 min  Stress: No Stress Concern Present (01/29/2022)   [employer] of Occupational Health - Occupational Stress Questionnaire    Feeling of Stress : Not at all  Social Connections: Moderately Isolated (01/29/2022)   Social Connection and Isolation  Panel [NHANES]    Frequency of Communication with Friends and Family: More than three times a week    Frequency of Social Gatherings with Friends and Family: Three times a week    Attends Religious Services: More than 4 times per year    Active Member of Clubs or Organizations: No    Attends Banker Meetings: Never    Marital Status: Widowed  Intimate Partner Violence: Not At Risk (01/29/2022)   Humiliation, Afraid, Rape, and Kick questionnaire    Fear of Current or Ex-Partner: No    Emotionally Abused: No    Physically Abused: No    Sexually Abused: No   Family Status  Relation Name Status   Mother  Deceased       died of old age 58   Father  Deceased at age 21   Sister  Deceased   Brother  Deceased   Sister  Deceased   Sister  Deceased   Daughter  Alive   Son  Alive   Daughter  Alive   Family History  Problem Relation Age of Onset   Heart attack Father    Lymphoma Sister    Aneurysm Brother    Cancer Sister    Stroke Sister    Allergies  Allergen Reactions   Bimatoprost Other (See Comments)    toxicity   Levofloxacin Diarrhea   Penicillins Itching   Shellfish Allergy Other (See Comments)   Statins Other (See Comments)    Myalgias - "all statins"  Per pt   Atorvastatin Other (See Comments)   Brimonidine Other (See Comments)   Dorzolamide Hcl-Timolol Mal Other (See Comments)   Lovastatin Other (See Comments)   Perindopril Other (See Comments)      Review of Systems  Constitutional:  Positive for chills and fever. Negative for malaise/fatigue.  HENT:  Negative for congestion.   Eyes:  Negative for blurred vision.  Respiratory:  Negative for shortness of breath.   Cardiovascular:  Negative for chest pain, palpitations and leg swelling.  Gastrointestinal:  Negative for abdominal pain, blood in stool and nausea.  Genitourinary:  Negative for dysuria and frequency.  Musculoskeletal:  Negative for falls.  Skin:  Negative for rash.  Neurological:   Positive for weakness. Negative for dizziness, loss of consciousness and headaches.  Endo/Heme/Allergies:  Negative for  environmental allergies.  Psychiatric/Behavioral:  Negative for depression. The patient is not nervous/anxious.       Objective:     BP 110/72 (BP Location: Left Arm, Patient Position: Sitting, Cuff Size: Normal)   Pulse 91   Temp 98.4 F (36.9 C) (Oral)   Resp 18   Ht 5\' 5"  (1.651 m)   Wt 160 lb 12.8 oz (72.9 kg)   SpO2 95%   BMI 26.76 kg/m  BP Readings from Last 3 Encounters:  05/09/23 110/72  01/04/23 (!) 158/79  12/03/22 (!) 114/59   Wt Readings from Last 3 Encounters:  05/09/23 160 lb 12.8 oz (72.9 kg)  01/04/23 166 lb (75.3 kg)  12/03/22 161 lb (73 kg)   SpO2 Readings from Last 3 Encounters:  05/09/23 95%  12/03/22 97%  11/26/22 96%      Physical Exam Vitals and nursing note reviewed.  Constitutional:      Appearance: She is well-developed.  HENT:     Head: Normocephalic and atraumatic.  Eyes:     Conjunctiva/sclera: Conjunctivae normal.  Neck:     Thyroid: No thyromegaly.     Vascular: No carotid bruit or JVD.  Cardiovascular:     Rate and Rhythm: Normal rate and regular rhythm.     Heart sounds: Normal heart sounds. No murmur heard. Pulmonary:     Effort: Pulmonary effort is normal. No respiratory distress.     Breath sounds: Normal breath sounds. No wheezing or rales.  Chest:     Chest wall: No tenderness.  Abdominal:     General: There is no distension.     Palpations: Abdomen is soft. There is no mass.     Tenderness: There is no abdominal tenderness. There is no guarding or rebound.     Hernia: No hernia is present.  Musculoskeletal:     Cervical back: Normal range of motion and neck supple. No rigidity or tenderness.  Lymphadenopathy:     Cervical: Cervical adenopathy present.  Neurological:     General: No focal deficit present.     Mental Status: She is alert and oriented to person, place, and time.     Motor: No  weakness.     Gait: Gait abnormal.     Comments: Pt In wheelchair   Psychiatric:        Mood and Affect: Mood normal.        Behavior: Behavior normal.        Thought Content: Thought content normal.        Judgment: Judgment normal.      Results for orders placed or performed in visit on 05/09/23  POCT Urinalysis Dipstick (Automated)  Result Value Ref Range   Color, UA yellow    Clarity, UA clear    Glucose, UA Negative Negative   Bilirubin, UA small    Ketones, UA 15    Spec Grav, UA 1.020 1.010 - 1.025   Blood, UA negative    pH, UA 6.0 5.0 - 8.0   Protein, UA Positive (A) Negative   Urobilinogen, UA 0.2 0.2 or 1.0 E.U./dL   Nitrite, UA negative    Leukocytes, UA Trace (A) Negative    Last CBC Lab Results  Component Value Date   WBC 8.2 11/26/2022   HGB 13.0 11/26/2022   HCT 37.7 11/26/2022   MCV 98.8 11/26/2022   MCH 33.7 (H) 11/12/2022   RDW 12.8 11/26/2022   PLT 167.0 11/26/2022   Last metabolic panel Lab Results  Component  Value Date   GLUCOSE 111 (H) 11/26/2022   NA 139 11/26/2022   K 4.7 11/26/2022   CL 99 11/26/2022   CO2 34 (H) 11/26/2022   BUN 27 (H) 11/26/2022   CREATININE 0.52 11/26/2022   EGFR 80 11/12/2022   CALCIUM 9.8 11/26/2022   PROT 6.8 11/26/2022   ALBUMIN 4.0 11/26/2022   BILITOT 0.4 11/26/2022   ALKPHOS 47 11/26/2022   AST 12 11/26/2022   ALT 14 11/26/2022   Last lipids Lab Results  Component Value Date   CHOL 224 (H) 12/13/2022   HDL 44.90 12/13/2022   LDLCALC 148 (H) 12/13/2022   TRIG 159.0 (H) 12/13/2022   CHOLHDL 5 12/13/2022   Last hemoglobin A1c Lab Results  Component Value Date   HGBA1C 7.2 (H) 12/13/2022   Last thyroid functions Lab Results  Component Value Date   TSH 3.950 06/22/2021   Last vitamin D Lab Results  Component Value Date   VD25OH 32.66 11/06/2021   Last vitamin B12 and Folate No results found for: "VITAMINB12", "FOLATE"    The ASCVD Risk score (Arnett DK, et al., 2019) failed to  calculate for the following reasons:   The 2019 ASCVD risk score is only valid for ages 36 to 67    Assessment & Plan:   Problem List Items Addressed This Visit       Unprioritized   Loose stools    Pt c/o chronic constipation and stool leakage  Refer to GI       Relevant Orders   Ambulatory referral to Gastroenterology   Fecal occult blood, imunochemical([facility])   Leukocytes in urine   Relevant Orders   Urine Culture   Fever - Primary    Low grade but he has been taking tylenol regularly  Check labs and urine       Relevant Medications   sulfamethoxazole-trimethoprim (BACTRIM DS) 800-160 MG tablet   Other Relevant Orders   CBC with Differential/Platelet   POCT Urinalysis Dipstick (Automated) (Completed)   POC COVID-19   Cervical lymphadenopathy    Pt with hx lymphoma Check labs  Start empiric abx---labs and  check urine       Relevant Medications   sulfamethoxazole-trimethoprim (BACTRIM DS) 800-160 MG tablet   Other Relevant Orders   CBC with Differential/Platelet   Comprehensive metabolic panel   POCT Urinalysis Dipstick (Automated) (Completed)    No follow-ups on file.    Donato Schultz, DO

## 2023-05-09 NOTE — Assessment & Plan Note (Signed)
Pt c/o chronic constipation and stool leakage  Refer to GI

## 2023-05-10 ENCOUNTER — Other Ambulatory Visit (INDEPENDENT_AMBULATORY_CARE_PROVIDER_SITE_OTHER): Payer: PPO

## 2023-05-10 DIAGNOSIS — R195 Other fecal abnormalities: Secondary | ICD-10-CM | POA: Diagnosis not present

## 2023-05-10 LAB — URINE CULTURE
MICRO NUMBER:: 15090608
Result:: NO GROWTH
SPECIMEN QUALITY:: ADEQUATE

## 2023-05-10 LAB — POC COVID19 BINAXNOW: SARS Coronavirus 2 Ag: NEGATIVE

## 2023-05-11 ENCOUNTER — Encounter: Payer: Self-pay | Admitting: Family

## 2023-05-12 ENCOUNTER — Telehealth: Payer: Self-pay

## 2023-05-12 LAB — FECAL OCCULT BLOOD, IMMUNOCHEMICAL: Fecal Occult Bld: POSITIVE — AB

## 2023-05-12 NOTE — Telephone Encounter (Signed)
Noted.  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 Not anemic based on CBC from office visit a few days ago. If they don't hear from referral in a few days or she starts to worsen, please follow-up. For any severe symptoms in the meantime, go to the ED.  Will also forward to [facility].

## 2023-05-12 NOTE — Telephone Encounter (Signed)
CRITICAL VALUE STICKER  CRITICAL VALUE: Positive IFOB  MESSENGER (representative from lab): [place]

## 2023-05-12 NOTE — Telephone Encounter (Signed)
Patient's daughter called concerned about ifob and labs. Let her know about positive ifob. She states her mom does have a history of hemorrhoids. She thinks her mom will not go to GI, but aware that referral has been placed.   They are concerned about other labs and looking for direction. She is aware that provider has not not reviewed, but asked that we call patient with results/recommendations when they have been reviewed (even if she views on mychart because the daughter has her mychart access).   FYI.

## 2023-05-13 DIAGNOSIS — H401111 Primary open-angle glaucoma, right eye, mild stage: Secondary | ICD-10-CM | POA: Diagnosis not present

## 2023-05-13 DIAGNOSIS — H532 Diplopia: Secondary | ICD-10-CM | POA: Diagnosis not present

## 2023-05-13 DIAGNOSIS — H04123 Dry eye syndrome of bilateral lacrimal glands: Secondary | ICD-10-CM | POA: Diagnosis not present

## 2023-05-13 DIAGNOSIS — H401122 Primary open-angle glaucoma, left eye, moderate stage: Secondary | ICD-10-CM | POA: Diagnosis not present

## 2023-05-13 NOTE — Telephone Encounter (Signed)
Spoke to patient and scheduled for 05/16/23 for virtual visit

## 2023-05-16 ENCOUNTER — Telehealth (INDEPENDENT_AMBULATORY_CARE_PROVIDER_SITE_OTHER): Payer: PPO | Admitting: Family

## 2023-05-16 DIAGNOSIS — Z7189 Other specified counseling: Secondary | ICD-10-CM | POA: Diagnosis not present

## 2023-05-16 NOTE — Progress Notes (Unsigned)
MyChart Video Visit    Virtual Visit via Video Note    Patient location: Home. Patient and provider in visit Provider location: Office  I discussed the limitations of evaluation and management by telemedicine and the availability of in person appointments. The patient expressed understanding and agreed to proceed.  Visit Date: 05/16/2023  Today's healthcare provider: Lemont Fillers, NP     Subjective:    Patient ID: Carla Chandler, female    DOB: October 16, 1926, 89 y.o.   MRN: [id number]  Chief Complaint  Patient presents with   Follow-up    HPI Wt Readings from Last 3 Encounters:  05/09/23 160 lb 12.8 oz (72.9 kg)  01/04/23 166 lb (75.3 kg)  12/03/22 161 lb (73 kg)    Past Medical History:  Diagnosis Date   Accelerated hypertension 12/04/2015   AF (paroxysmal atrial fibrillation) (HCC) 06/12/2020   Anxiety    Bilateral impacted cerumen 02/12/2018   Breast cancer (HCC)    right   Carotid artery stenosis    CN VI palsy, left 03/13/2019   Cyst of joint of shoulder 12/20/2017   Dizziness 11/12/2020   DLBCL (diffuse large B cell lymphoma) (HCC) 02/29/2016   Drusen of macula of both eyes 02/29/2016   Dry eyes, bilateral 02/29/2016   Dyslipidemia, goal LDL below 130 10/26/2016   Dysphagia 05/25/2015   Dyspnea 05/25/2015   Essential hypertension 05/27/2015   Excessive cerumen in ear canal, bilateral 04/26/2017   Frequent PAC (premature atrial contraction) 01/09/2021   Gastroesophageal reflux disease 06/12/2020   Glaucoma 06/12/2020   History of B-cell lymphoma 10/26/2016   History of breast cancer 06/12/2020   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 2013 rt radiation with balloon   Hyperlipidemia    Hypertension    Hyponatremia 05/25/2015   Formatting of this note might be different from the original. Mild and likely 2ry to diuretic use    Idiopathic peripheral neuropathy 10/26/2016   Insomnia 06/12/2020   Knee effusion, right 01/22/2016   Laryngeal edema 05/25/2015   Lymphoma (HCC) 05/05/2015   Microcalcification of left breast on mammogram 09/27/2017   Murmur, cardiac 07/11/2020   Osteopenia of left thigh 10/26/2016   Other visual disturbances 12/04/2015   PAF (paroxysmal atrial fibrillation) (HCC) 07/11/2020   PAT (paroxysmal atrial tachycardia) 01/09/2021   Poor balance 06/12/2020   Primary hypertension 11/12/2020   Primary open angle glaucoma of left eye, moderate stage 02/29/2016   Primary osteoarthritis of both knees 01/22/2016   Primary osteoarthritis of both shoulders 04/12/2017   Primary osteoarthritis of left knee 01/11/2017   Pseudophakia of both eyes 02/29/2016   PVD (posterior vitreous detachment), both eyes 02/29/2016   Radiation injury 05/27/2015   Right rotator cuff tear arthropathy 01/03/2018   Sensorineural hearing loss (SNHL) of both ears 04/26/2017   Shortness of breath 02/13/2021   Varicose veins of left lower extremity 06/11/2020    Past Surgical History:  Procedure Laterality Date   BLADDER SUSPENSION     BREAST SURGERY Right 2008   lumpectomy   LAPAROSCOPIC HYSTERECTOMY     LYMPHADENECTOMY Right 2016    Family History  Problem Relation Age of Onset   Heart attack Father    Lymphoma Sister    Aneurysm Brother    Cancer Sister    Stroke Sister     Social History   Socioeconomic History   Marital status: Widowed  Spouse name: Not on file   Number of children: Not on file   Years of education: Not on file   Highest education level: Not on file  Occupational History   Occupation: Retired  Tobacco Use   Smoking status: Former   Smokeless tobacco: Never  Building services engineer Use: Never used  Substance and Sexual Activity   Alcohol use: Yes    Alcohol/week: 3.0 standard drinks of alcohol    Types: 3 Glasses of wine per week    Comment: wine 3 x per week   Drug use: Never    Sexual activity: Not Currently  Other Topics Concern   Not on file  Social History Narrative   Grew up in [place], moved to [place] in her 20's   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   Husband died when she was in her 70's   Son and 2 daughters- all local   Has 5 grandchildren and 3 great grandchildren   Lives by herself   No pets   Used to enjoy golf   Enjoys golf   Social Determinants of Health   Financial Resource Strain: Low Risk  (01/29/2022)   Overall Financial Resource Strain (CARDIA)    Difficulty of Paying Living Expenses: Not hard at all  Food Insecurity: No Food Insecurity (01/29/2022)   Hunger Vital Sign    Worried About Running Out of Food in the Last Year: Never true    Ran Out of Food in the Last Year: Never true  Transportation Needs: No Transportation Needs (01/29/2022)   PRAPARE - Administrator, Civil Service (Medical): No    Lack of Transportation (Non-Medical): No  Physical Activity: Sufficiently Active (01/29/2022)   Exercise Vital Sign    Days of Exercise per Week: 7 days    Minutes of Exercise per Session: 60 min  Stress: No Stress Concern Present (01/29/2022)   [employer] of Occupational Health - Occupational Stress Questionnaire    Feeling of Stress : Not at all  Social Connections: Moderately Isolated (01/29/2022)   Social Connection and Isolation Panel [NHANES]    Frequency of Communication with Friends and Family: More than three times a week    Frequency of Social Gatherings with Friends and Family: Three times a week    Attends Religious Services: More than 4 times per year    Active Member of Clubs or Organizations: No    Attends Banker Meetings: Never    Marital Status: Widowed  Intimate Partner Violence: Not At Risk (01/29/2022)   Humiliation, Afraid, Rape, and Kick questionnaire    Fear of Current or Ex-Partner: No    Emotionally Abused: No    Physically Abused: No    Sexually Abused:  No    Outpatient Medications Prior to Visit  Medication Sig Dispense Refill   aspirin EC 81 MG tablet Take 1 tablet (81 mg total) by mouth daily. Swallow whole. 30 tablet 12   b complex vitamins capsule Take 1 capsule by mouth daily.     citalopram (CELEXA) 20 MG tablet Take 1 tablet (20 mg total) by mouth daily. 90 tablet 1   clobetasol (TEMOVATE) 0.05 % external solution Apply topically 2 (two) times daily as needed.     cloNIDine (CATAPRES) 0.1 MG tablet Take 1 tablet (0.1 mg total) by mouth 2 (two) times daily. 180 tablet 1   famotidine (PEPCID) 20 MG tablet Take 1 tablet by mouth twice daily  180 tablet 0   Guselkumab (TREMFYA [place]) Inject into the skin.     LORazepam (ATIVAN) 1 MG tablet TAKE 1 TABLET(1 MG) BY MOUTH AT BEDTIME 90 tablet 1   meloxicam (MOBIC) 7.5 MG tablet Take 1 tablet by mouth once daily 30 tablet 0   OVER THE COUNTER MEDICATION Once PRN. Preservative Free Moisture Eye drops.     ROCKLATAN 0.02-0.005 % SOLN Apply to eye.     sulfamethoxazole-trimethoprim (BACTRIM DS) 800-160 MG tablet Take 1 tablet by mouth 2 (two) times daily. 14 tablet 0   verapamil (CALAN-SR) 180 MG CR tablet Take 1 tablet (180 mg total) by mouth at bedtime. 90 tablet 3   No facility-administered medications prior to visit.    Allergies  Allergen Reactions   Bimatoprost Other (See Comments)    toxicity   Levofloxacin Diarrhea   Penicillins Itching   Shellfish Allergy Other (See Comments)   Statins Other (See Comments)    Myalgias - "all statins"  Per pt   Atorvastatin Other (See Comments)   Brimonidine Other (See Comments)   Dorzolamide Hcl-Timolol Mal Other (See Comments)   Lovastatin Other (See Comments)   Perindopril Other (See Comments)    ROS     Objective:    Physical Exam  There were no vitals taken for this visit. Wt Readings from Last 3 Encounters:  05/09/23 160 lb 12.8 oz (72.9 kg)  01/04/23 166 lb (75.3 kg)  12/03/22 161 lb (73 kg)       Assessment & Plan:    Problem List Items Addressed This Visit   None   I am having Carla Chandler maintain her b complex vitamins, Rocklatan, OVER THE COUNTER MEDICATION, Guselkumab (TREMFYA [place]), clobetasol, verapamil, cloNIDine, aspirin EC, LORazepam, citalopram, famotidine, meloxicam, and sulfamethoxazole-trimethoprim.  No orders of the defined types were placed in this encounter.   I discussed the assessment and treatment plan with the patient. The patient was provided an opportunity to ask questions and all were answered. The patient agreed with the plan and demonstrated an understanding of the instructions.   The patient was advised to call back or seek an in-person evaluation if the symptoms worsen or if the condition fails to improve as anticipated.  I provided *** minutes of face-to-face time during this encounter.   Lemont Fillers, NP [facility] at [facility] [phone] (phone) [phone] (fax)  [facility]

## 2023-05-17 DIAGNOSIS — Z7189 Other specified counseling: Secondary | ICD-10-CM | POA: Insufficient documentation

## 2023-05-17 NOTE — Assessment & Plan Note (Signed)
Discussed with patient and daughter that IFOB could be due to hemorrhoids or something more serious such as GI malignancy. Discussed that only way to know for sure would be to complete Colo/endo.  Patient states that she understands this and does not wish to pursue additional work up because she would not seek treatment if she was diagnosed with colon cancer.   She requests DNR status.  [place] Gold form was filled today at pt request and mailed to her home.  She is advised to place this form on her refrigerator.

## 2023-05-17 NOTE — Progress Notes (Signed)
MyChart Video Visit    Virtual Visit via Video Note    Patient location: Home. Patient and provider in visit Provider location: Office  I discussed the limitations of evaluation and management by telemedicine and the availability of in person appointments. The patient expressed understanding and agreed to proceed.  Visit Date: 05/16/2023  Today's healthcare provider: Lemont Fillers, NP     Subjective:    Patient ID: Carla Chandler, female    DOB: 1926-02-06, 89 y.o.   MRN: [id number]  Chief Complaint  Patient presents with   Follow-up    HPI  Patient is a 89 yr old female who presents today with her daughter to discuss goals of care. She recently had a positive IFOB test.    Past Medical History:  Diagnosis Date   Accelerated hypertension 12/04/2015   AF (paroxysmal atrial fibrillation) (HCC) 06/12/2020   Anxiety    Bilateral impacted cerumen 02/12/2018   Breast cancer (HCC)    right   Carotid artery stenosis    CN VI palsy, left 03/13/2019   Cyst of joint of shoulder 12/20/2017   Dizziness 11/12/2020   DLBCL (diffuse large B cell lymphoma) (HCC) 02/29/2016   Drusen of macula of both eyes 02/29/2016   Dry eyes, bilateral 02/29/2016   Dyslipidemia, goal LDL below 130 10/26/2016   Dysphagia 05/25/2015   Dyspnea 05/25/2015   Essential hypertension 05/27/2015   Excessive cerumen in ear canal, bilateral 04/26/2017   Frequent PAC (premature atrial contraction) 01/09/2021   Gastroesophageal reflux disease 06/12/2020   Glaucoma 06/12/2020   History of B-cell lymphoma 10/26/2016   History of breast cancer 06/12/2020   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 2013 rt radiation with balloon   Hyperlipidemia    Hypertension    Hyponatremia 05/25/2015   Formatting of this note might be different from the original. Mild and likely 2ry to diuretic use    Idiopathic peripheral neuropathy 10/26/2016   Insomnia 06/12/2020   Knee effusion, right 01/22/2016   Laryngeal edema 05/25/2015   Lymphoma (HCC) 05/05/2015   Microcalcification of left breast on mammogram 09/27/2017   Murmur, cardiac 07/11/2020   Osteopenia of left thigh 10/26/2016   Other visual disturbances 12/04/2015   PAF (paroxysmal atrial fibrillation) (HCC) 07/11/2020   PAT (paroxysmal atrial tachycardia) 01/09/2021   Poor balance 06/12/2020   Primary hypertension 11/12/2020   Primary open angle glaucoma of left eye, moderate stage 02/29/2016   Primary osteoarthritis of both knees 01/22/2016   Primary osteoarthritis of both shoulders 04/12/2017   Primary osteoarthritis of left knee 01/11/2017   Pseudophakia of both eyes 02/29/2016   PVD (posterior vitreous detachment), both eyes 02/29/2016   Radiation injury 05/27/2015   Right rotator cuff tear arthropathy 01/03/2018   Sensorineural hearing loss (SNHL) of both ears 04/26/2017   Shortness of breath 02/13/2021   Varicose veins of left lower extremity 06/11/2020    Past Surgical History:  Procedure Laterality Date   BLADDER SUSPENSION     BREAST SURGERY Right 2008   lumpectomy   LAPAROSCOPIC HYSTERECTOMY     LYMPHADENECTOMY Right 2016    Family History  Problem Relation Age of Onset   Heart attack Father    Lymphoma Sister    Aneurysm Brother    Cancer Sister    Stroke Sister     Social History   Socioeconomic History   Marital status: Widowed  Spouse name: Not on file   Number of children: Not on file   Years of education: Not on file   Highest education level: Not on file  Occupational History   Occupation: Retired  Tobacco Use   Smoking status: Former   Smokeless tobacco: Never  Building services engineer Use: Never used  Substance and Sexual Activity   Alcohol use: Yes    Alcohol/week: 3.0 standard drinks of alcohol    Types: 3 Glasses of wine per week    Comment: wine 3 x per week   Drug use:  Never   Sexual activity: Not Currently  Other Topics Concern   Not on file  Social History Narrative   Grew up in [place], moved to [place] in her 20's   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   Husband died when she was in her 64's   Son and 2 daughters- all local   Has 5 grandchildren and 3 great grandchildren   Lives by herself   No pets   Used to enjoy golf   Enjoys golf   Social Determinants of Health   Financial Resource Strain: Low Risk  (01/29/2022)   Overall Financial Resource Strain (CARDIA)    Difficulty of Paying Living Expenses: Not hard at all  Food Insecurity: No Food Insecurity (01/29/2022)   Hunger Vital Sign    Worried About Running Out of Food in the Last Year: Never true    Ran Out of Food in the Last Year: Never true  Transportation Needs: No Transportation Needs (01/29/2022)   PRAPARE - Administrator, Civil Service (Medical): No    Lack of Transportation (Non-Medical): No  Physical Activity: Sufficiently Active (01/29/2022)   Exercise Vital Sign    Days of Exercise per Week: 7 days    Minutes of Exercise per Session: 60 min  Stress: No Stress Concern Present (01/29/2022)   [employer] of Occupational Health - Occupational Stress Questionnaire    Feeling of Stress : Not at all  Social Connections: Moderately Isolated (01/29/2022)   Social Connection and Isolation Panel [NHANES]    Frequency of Communication with Friends and Family: More than three times a week    Frequency of Social Gatherings with Friends and Family: Three times a week    Attends Religious Services: More than 4 times per year    Active Member of Clubs or Organizations: No    Attends Banker Meetings: Never    Marital Status: Widowed  Intimate Partner Violence: Not At Risk (01/29/2022)   Humiliation, Afraid, Rape, and Kick questionnaire    Fear of Current or Ex-Partner: No    Emotionally Abused: No    Physically Abused: No    Sexually  Abused: No    Outpatient Medications Prior to Visit  Medication Sig Dispense Refill   aspirin EC 81 MG tablet Take 1 tablet (81 mg total) by mouth daily. Swallow whole. 30 tablet 12   b complex vitamins capsule Take 1 capsule by mouth daily.     citalopram (CELEXA) 20 MG tablet Take 1 tablet (20 mg total) by mouth daily. 90 tablet 1   clobetasol (TEMOVATE) 0.05 % external solution Apply topically 2 (two) times daily as needed.     cloNIDine (CATAPRES) 0.1 MG tablet Take 1 tablet (0.1 mg total) by mouth 2 (two) times daily. 180 tablet 1   famotidine (PEPCID) 20 MG tablet Take 1 tablet by mouth twice daily  180 tablet 0   Guselkumab (TREMFYA [place]) Inject into the skin.     LORazepam (ATIVAN) 1 MG tablet TAKE 1 TABLET(1 MG) BY MOUTH AT BEDTIME 90 tablet 1   meloxicam (MOBIC) 7.5 MG tablet Take 1 tablet by mouth once daily 30 tablet 0   OVER THE COUNTER MEDICATION Once PRN. Preservative Free Moisture Eye drops.     ROCKLATAN 0.02-0.005 % SOLN Apply to eye.     sulfamethoxazole-trimethoprim (BACTRIM DS) 800-160 MG tablet Take 1 tablet by mouth 2 (two) times daily. 14 tablet 0   verapamil (CALAN-SR) 180 MG CR tablet Take 1 tablet (180 mg total) by mouth at bedtime. 90 tablet 3   No facility-administered medications prior to visit.    Allergies  Allergen Reactions   Bimatoprost Other (See Comments)    toxicity   Levofloxacin Diarrhea   Penicillins Itching   Shellfish Allergy Other (See Comments)   Statins Other (See Comments)    Myalgias - "all statins"  Per pt   Atorvastatin Other (See Comments)   Brimonidine Other (See Comments)   Dorzolamide Hcl-Timolol Mal Other (See Comments)   Lovastatin Other (See Comments)   Perindopril Other (See Comments)    ROS     Objective:    Physical Exam  There were no vitals taken for this visit. Wt Readings from Last 3 Encounters:  05/09/23 160 lb 12.8 oz (72.9 kg)  01/04/23 166 lb (75.3 kg)  12/03/22 161 lb (73 kg)    Gen: Awake, alert, no  acute distress Resp: Breathing is even and non-labored Psych: calm/pleasant demeanor Neuro: Alert and Oriented x 3, + facial symmetry, speech is clear.     Assessment & Plan:   Problem List Items Addressed This Visit       Unprioritized   Goals of care, counseling/discussion - Primary    Discussed with patient and daughter that IFOB could be due to hemorrhoids or something more serious such as GI malignancy. Discussed that only way to know for sure would be to complete Colo/endo.  Patient states that she understands this and does not wish to pursue additional work up because she would not seek treatment if she was diagnosed with colon cancer.   She requests DNR status.  [place] [employer] form was filled today at pt request and mailed to her home.  She is advised to place this form on her refrigerator.       20 minutes spent on today's visit.  All of the time was spent on counseling/discussion of goals of care.   I am having Smitty Cords. Paulino maintain her b complex vitamins, Rocklatan, OVER THE COUNTER MEDICATION, Guselkumab (TREMFYA [place]), clobetasol, verapamil, cloNIDine, aspirin EC, LORazepam, citalopram, famotidine, meloxicam, and sulfamethoxazole-trimethoprim.  No orders of the defined types were placed in this encounter.   I discussed the assessment and treatment plan with the patient. The patient was provided an opportunity to ask questions and all were answered. The patient agreed with the plan and demonstrated an understanding of the instructions.   The patient was advised to call back or seek an in-person evaluation if the symptoms worsen or if the condition fails to improve as anticipated.  Lemont Fillers, NP [facility] at [facility] [phone] (phone) [phone] (fax)  [facility]

## 2023-05-23 DIAGNOSIS — L400 Psoriasis vulgaris: Secondary | ICD-10-CM | POA: Diagnosis not present

## 2023-05-23 DIAGNOSIS — D485 Neoplasm of uncertain behavior of skin: Secondary | ICD-10-CM | POA: Diagnosis not present

## 2023-05-24 ENCOUNTER — Other Ambulatory Visit: Payer: Self-pay | Admitting: Family

## 2023-05-31 ENCOUNTER — Other Ambulatory Visit: Payer: Self-pay | Admitting: Family

## 2023-06-15 DIAGNOSIS — C44319 Basal cell carcinoma of skin of other parts of face: Secondary | ICD-10-CM | POA: Diagnosis not present

## 2023-06-25 ENCOUNTER — Other Ambulatory Visit: Payer: Self-pay | Admitting: Family

## 2023-07-04 ENCOUNTER — Telehealth: Payer: Self-pay | Admitting: Cardiology

## 2023-07-04 NOTE — Telephone Encounter (Signed)
Called pt back. Pt is having a lot of lightheadedness and dizziness and it gets worse throughout the day, having pain in the center of her chest toward the left that comes and goes. Very little chest pressure. Pt get SOB with exertion. Swelling in her feet only and they are red. Unable to take BP and HR due to her machine not working.

## 2023-07-04 NOTE — Telephone Encounter (Signed)
Pt is requesting a callback regarding her having some discomforts and some concerns regarding her medications. Please advise.

## 2023-07-04 NOTE — Telephone Encounter (Signed)
Attempted to contact patient, LVM to call back to discuss, left call back number.

## 2023-07-04 NOTE — Telephone Encounter (Signed)
Called pt back and scheduled her with an NP for this Wednesday. Pt verbalized understanding.

## 2023-07-06 ENCOUNTER — Ambulatory Visit: Payer: PPO | Admitting: Adult Health

## 2023-07-06 NOTE — Progress Notes (Unsigned)
Cardiology Clinic Note   Date: 07/07/2023 ID: Carla Chandler, DOB Feb 20, 1926, MRN [id number]  Primary Cardiologist:  Thomasene Ripple, DO  Patient Profile    Carla Chandler is a 89 y.o. female who presents to the clinic today for evaluation of DOE.     Past medical history significant for: PAF/A. tach/PACs. 14-day ZIO 12/04/2020: Min HR 55 bpm, max HR 182 bpm, average HR 79 bpm.  Predominant underlying rhythm was sinus rhythm.  10,463 runs of SVT, fastest 7 beats with max rate 182 bpm, longest 40.6 seconds average rate 108 bpm.  Frequent PACs (8.4%).  Rare PVCs.  No ventricular tachycardia, pauses, AV block, A-fib.  9 patient triggered events associated with PACs.  PSVT likely A. tach with variable block. Echo 07/10/2021: EF 65 to 70%.  Moderate LVH.  Grade I DD.  Elevated LVEDP.  Normal RV function.  Trivial MR.  Mild aortic valve sclerosis without stenosis. Carotid artery stenosis. Carotid ultrasound 12/18/2022: Bilateral ICA 50 to 69%. Hypertension. Hyperlipidemia. TIA. Lymphoma. Right breast cancer. GERD.     History of Present Illness    Carla Chandler was first evaluated by Dr. Servando Salina on 07/11/2020 for irregular heartbeat at the request of Sandford Craze, NP.  Patient with a remote history of A-fib occurring [place] the [place].  She reported experiencing significant palpitations and was taken to the emergency room where she was told she had A-fib.  She went back into sinus rhythm and had not really experienced any further episodes.  She was not in favor of taking anticoagulation secondary to easy bleeding.  At the time of her visit heart murmur was heard and echo was ordered which showed normal LV/RV function, Grade I DD, mild aortic valve sclerosis without stenosis.  Patient wore a 14-day ZIO which showed runs of SVT felt to be A. tach with variable block and frequent PACs.  Patient continues to be followed by Dr. Servando Salina for the above outlined  history.  She was last seen in the office by Dr. Servando Salina on 11/12/2022 for routine follow-up.  She was doing well at that time and no medication changes were made.  Patient contacted the office on 07/04/2023 with complaints of lightheadedness and dizziness as well as chest pain and DOE.  She also reported edema in bilateral feet.  Today, patient is accompanied by her daughter. Patient initially reports worsening lightheadedness and dizziness. Upon further discussion patient denies dizziness but is experiencing balance issues and unsteadiness worsened by chronic blurred vision. Related to her vision, she reports she can but her vision is not crisp. Symptoms are not impacted by position changes and sound like it occurs while walking. No recent falls. Her biggest concern today is DOE described as getting winded with walking and activities. Patient expresses frustration that she has to stop and rest between activities. Her daughter states she has noticed patient appears more easily winded and needs more frequent rest breaks. She reports continued palpitations particularly in the early morning hours. This is unchanged from previous. She also reports her feet will get red and swollen. Feet are normal in the mornings although they may still be a little red and get puffy throughout the day particularly her ankles. She reports some epigastric discomfort that she relates to reflux that has been stable since cancer treatment ended. She has tried PT in the past but it flared up her chronic pain. Patient is concerned about her BP (110/52 today). She feels it  is trending down. PCP had increased clonidine to twice a day about 8 months ago.      ROS: All other systems reviewed and are otherwise negative except as noted in History of Present Illness.  Studies Reviewed    EKG Interpretation Date/Time:  Thursday July 07 2023 08:57:18 EDT Ventricular Rate:  74 PR Interval:  228 QRS Duration:  84 QT Interval:  422 QTC  Calculation: 468 R Axis:   -51  Text Interpretation: Sinus rhythm with 1st degree A-V block with Premature atrial complexes Left axis deviation Moderate voltage criteria for LVH, may be normal variant ( R in aVL , Cornell product ) Anterolateral infarct , age undetermined No significant change since 12/13/2022 (not in Muse) Confirmed by Carlos Levering [phone] on 07/07/2023 9:17:41 AM           Physical Exam    VS:  BP (!) 110/52   Pulse (!) 53   Ht 5\' 4"  (1.626 m)   Wt 160 lb 6.4 oz (72.8 kg)   SpO2 95%   BMI 27.53 kg/m  , BMI Body mass index is 27.53 kg/m.  GEN: Well nourished, well developed, in no acute distress. Neck: No JVD or carotid bruits. Cardiac:  RRR. Extrasystole. No murmurs. No rubs or gallops.   Respiratory:  Respirations regular and unlabored. Clear to auscultation without rales, wheezing or rhonchi. GI: Soft, nontender, nondistended. Extremities: Radials/DP/PT 2+ and equal bilaterally. No clubbing or cyanosis. No edema. Reddened feet and toes in the distribution of her shoes.   Skin: Warm and dry, no rash. Neuro: Strength intact.  Assessment & Plan    DOE.  Patient reports feeling more easily winded and needing more breaks with activity for the past 2 months. She denies shortness of breath at rest. Will get an echo for further evaluation. Patient is concerned about undergoing echo secondary to her chronic pain but agrees to proceed.  PAF/A. tach/PACs.  Patient with remote history of A-fib 20+ years ago.  No apparent recurrence.  14-day ZIO January 2022 showed runs of SVT thought to be A. tach and frequent PACs.  Patient declined anticoagulation in favor of aspirin secondary to easy bleeding. Patient reports palpitations particularly in the early morning hours essentially unchanged from previous. RRR with extrasystole on exam today. Continue verapamil, aspirin. Hypertension: BP today 110/52. Patient denies headaches or dizziness. Continue verapamil. Patient is  concerned that BP is trending down. PCP increased clonidine to twice a day about 8 months ago. She is instructed to go down to once a day. She will take clonidine in the morning and verapamil at bedtime.  Lower extremity edema. Patient complains of swelling and redness in bilateral feet and ankles that is at its best in the morning and increasing throughout the day. She tries to elevate when she can. She has no edema today. Feet and toes are reddened in the distribution of her shoes. She reports feeling better when shoes are off. She will try compression socks.   Disposition: Echo. Return in 3 months or sooner as needed.          Signed, Etta Grandchild. , DNP, NP-C

## 2023-07-07 ENCOUNTER — Ambulatory Visit: Payer: PPO | Attending: Adult Health | Admitting: Student

## 2023-07-07 ENCOUNTER — Encounter: Payer: Self-pay | Admitting: Student

## 2023-07-07 VITALS — BP 110/52 | HR 53 | Ht 64.0 in | Wt 160.4 lb

## 2023-07-07 DIAGNOSIS — I10 Essential (primary) hypertension: Secondary | ICD-10-CM | POA: Diagnosis not present

## 2023-07-07 DIAGNOSIS — I491 Atrial premature depolarization: Secondary | ICD-10-CM

## 2023-07-07 DIAGNOSIS — R0609 Other forms of dyspnea: Secondary | ICD-10-CM

## 2023-07-07 DIAGNOSIS — R002 Palpitations: Secondary | ICD-10-CM | POA: Diagnosis not present

## 2023-07-07 DIAGNOSIS — R600 Localized edema: Secondary | ICD-10-CM | POA: Diagnosis not present

## 2023-07-07 DIAGNOSIS — I480 Paroxysmal atrial fibrillation: Secondary | ICD-10-CM

## 2023-07-07 NOTE — Patient Instructions (Addendum)
Medication Instructions:  Take the Clonidine one in the morning  *If you need a refill on your cardiac medications before your next appointment, please call your pharmacy*   Lab Work: none If you have labs (blood work) drawn today and your tests are completely normal, you will receive your results only by: MyChart Message (if you have MyChart) OR A paper copy in the mail If you have any lab test that is abnormal or we need to change your treatment, we will call you to review the results.   Testing/Procedures: Your physician has requested that you have an echocardiogram. Echocardiography is a painless test that uses sound waves to create images of your heart. It provides your doctor with information about the size and shape of your heart and how well your heart's chambers and valves are working. This procedure takes approximately one hour. There are no restrictions for this procedure. Please do NOT wear perfume or lotions (deodorant is allowed). Please arrive 15 minutes prior to your appointment time.    Follow-Up: At [facility], you and your health needs are our priority.  As part of our continuing mission to provide you with exceptional heart care, we have created designated Provider Care Teams.  These Care Teams include your primary Cardiologist (physician) and Advanced Practice Providers (APPs -  Physician Assistants and Nurse Practitioners) who all work together to provide you with the care you need, when you need it.  We recommend signing up for the patient portal called "MyChart".  Sign up information is provided on this After Visit Summary.  MyChart is used to connect with patients for Virtual Visits (Telemedicine).  Patients are able to view lab/test results, encounter notes, upcoming appointments, etc.  Non-urgent messages can be sent to your provider as well.   To learn more about what you can do with MyChart, go to [email or web address].    Your next appointment:    3 month(s)  Provider:   Thomasene Ripple, DO

## 2023-07-13 ENCOUNTER — Other Ambulatory Visit: Payer: Self-pay

## 2023-07-13 MED ORDER — LORAZEPAM 1 MG PO TABS: 1.0000 mg | ORAL_TABLET | Freq: Every day | ORAL | 1 refills | Status: DC

## 2023-07-13 NOTE — Telephone Encounter (Signed)
New refill requested for Lorazepam 1 mg [facility] [address]

## 2023-07-14 ENCOUNTER — Telehealth: Payer: Self-pay | Admitting: Family

## 2023-07-14 MED ORDER — LORAZEPAM 1 MG PO TABS: 1.0000 mg | ORAL_TABLET | Freq: Every day | ORAL | 1 refills | Status: DC

## 2023-07-14 NOTE — Telephone Encounter (Signed)
Pt called stating that she would like to have her lorazepam rerouted to the following pharmacy:   [id number] - [place], [place] - [address], [place] [address] Phone: [phone]  Fax: [phone]   Pt also stating that she would like to have [facility] removed off her preferred pharmacies list.

## 2023-07-14 NOTE — Addendum Note (Signed)
Addended by: Sandford Craze on: 07/14/2023 11:11 AM   Modules accepted: Orders

## 2023-07-22 ENCOUNTER — Other Ambulatory Visit: Payer: Self-pay | Admitting: Family

## 2023-07-29 ENCOUNTER — Ambulatory Visit (HOSPITAL_COMMUNITY): Payer: PPO | Attending: Cardiology

## 2023-07-29 DIAGNOSIS — R0609 Other forms of dyspnea: Secondary | ICD-10-CM | POA: Insufficient documentation

## 2023-07-29 LAB — ECHOCARDIOGRAM COMPLETE
Area-P 1/2: 3.74 cm2
S' Lateral: 2.1 cm

## 2023-08-05 ENCOUNTER — Telehealth: Payer: Self-pay | Admitting: Cardiology

## 2023-08-05 NOTE — Telephone Encounter (Signed)
Patient is requesting a call back to discuss echo results.

## 2023-08-18 NOTE — Telephone Encounter (Signed)
Results discussed with pt on 9/20 with another staff member. See chart.

## 2023-09-02 ENCOUNTER — Other Ambulatory Visit: Payer: Self-pay | Admitting: Family

## 2023-09-18 ENCOUNTER — Other Ambulatory Visit: Payer: Self-pay | Admitting: Family

## 2023-10-04 ENCOUNTER — Other Ambulatory Visit: Payer: Self-pay | Admitting: Family

## 2023-10-07 ENCOUNTER — Ambulatory Visit: Payer: PPO | Attending: Cardiology | Admitting: Cardiology

## 2023-10-07 ENCOUNTER — Encounter: Payer: Self-pay | Admitting: Cardiology

## 2023-10-07 VITALS — BP 126/84 | HR 86 | Ht 64.0 in | Wt 166.4 lb

## 2023-10-07 DIAGNOSIS — I480 Paroxysmal atrial fibrillation: Secondary | ICD-10-CM

## 2023-10-07 DIAGNOSIS — I491 Atrial premature depolarization: Secondary | ICD-10-CM

## 2023-10-07 DIAGNOSIS — I10 Essential (primary) hypertension: Secondary | ICD-10-CM | POA: Diagnosis not present

## 2023-10-07 DIAGNOSIS — R42 Dizziness and giddiness: Secondary | ICD-10-CM | POA: Diagnosis not present

## 2023-10-07 NOTE — Patient Instructions (Signed)
 Medication Instructions:  Your physician recommends that you continue on your current medications as directed. Please refer to the Current Medication list given to you today.  *If you need a refill on your cardiac medications before your next appointment, please call your pharmacy*   Follow-Up: At [facility], you and your health needs are our priority.  As part of our continuing mission to provide you with exceptional heart care, we have created designated Provider Care Teams.  These Care Teams include your primary Cardiologist (physician) and Advanced Practice Providers (APPs -  Physician Assistants and Nurse Practitioners) who all work together to provide you with the care you need, when you need it.   Your next appointment:   1 year(s)  Provider:   Thomasene Ripple, DO

## 2023-10-10 NOTE — Progress Notes (Signed)
Cardiology Office Note:    Date:  10/10/2023   ID:  Carla Chandler, DOB 07-Aug-1926, MRN [id number]  PCP:  Sandford Craze, NP  Cardiologist:  Thomasene Ripple, DO  Electrophysiologist:  None   Referring MD: Sandford Craze, NP   " I am having increasing palpitations  History of Present Illness:    Carla Chandler is a 89 y.o. female with a hx of  asthma, paroxysmal atrial fibrillation which was diagnosed over 20 years ago has not had any recurrent episodes and has been back in sinus rhythm, hyperlipidemia, hypertension. Patient is here today for follow-up visit.  She is here today with her daughter.   She presents with increasing lightheadedness and imbalance. She reports a recent fall due to imbalance while using her walker. The lightheadedness is described as constant and seems to worsen during the day, not specifically upon waking. The patient denies any associated ringing in the ears. She also reports frequent nausea, which she attributes to the lightheadedness.  The patient's daughter notes that the patient seems more disoriented and slurry in the mornings, which she suspects might be due to the lorazepam taken at night. The patient also reports occasional coughing, which she attributes to a family tendency towards a dry cough.  The patient is currently on clonidine, verapamil, and lorazepam. She takes clonidine in the morning and verapamil and lorazepam at night. She reports variable blood pressure readings at home, sometimes low and sometimes high. She also mentions occasional palpitations, described as a "skipping" sensation.  Past Medical History:  Diagnosis Date   Accelerated hypertension 12/04/2015   AF (paroxysmal atrial fibrillation) (HCC) 06/12/2020   Anxiety    Bilateral impacted cerumen 02/12/2018   Breast cancer (HCC)    right   Carotid artery stenosis    CN VI palsy, left 03/13/2019   Cyst of joint of shoulder 12/20/2017   Dizziness 11/12/2020   DLBCL  (diffuse large B cell lymphoma) (HCC) 02/29/2016   Drusen of macula of both eyes 02/29/2016   Dry eyes, bilateral 02/29/2016   Dyslipidemia, goal LDL below 130 10/26/2016   Dysphagia 05/25/2015   Dyspnea 05/25/2015   Essential hypertension 05/27/2015   Excessive cerumen in ear canal, bilateral 04/26/2017   Frequent PAC (premature atrial contraction) 01/09/2021   Gastroesophageal reflux disease 06/12/2020   Glaucoma 06/12/2020   History of B-cell lymphoma 10/26/2016   History of breast cancer 06/12/2020   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 2013 rt radiation with balloon   Hyperlipidemia    Hypertension    Hyponatremia 05/25/2015   Formatting of this note might be different from the original. Mild and likely 2ry to diuretic use   Idiopathic peripheral neuropathy 10/26/2016   Insomnia 06/12/2020   Knee effusion, right 01/22/2016   Laryngeal edema 05/25/2015   Lymphoma (HCC) 05/05/2015   Microcalcification of left breast on mammogram 09/27/2017   Murmur, cardiac 07/11/2020   Osteopenia of left thigh 10/26/2016   Other visual disturbances 12/04/2015   PAF (paroxysmal atrial fibrillation) (HCC) 07/11/2020   PAT (paroxysmal atrial tachycardia) (HCC) 01/09/2021   Poor balance 06/12/2020   Primary hypertension 11/12/2020   Primary open angle glaucoma of left eye, moderate stage 02/29/2016   Primary osteoarthritis of both knees 01/22/2016   Primary osteoarthritis of both shoulders 04/12/2017   Primary osteoarthritis of left knee 01/11/2017   Pseudophakia of both eyes 02/29/2016   PVD (posterior  vitreous detachment), both eyes 02/29/2016   Radiation injury 05/27/2015   Right rotator cuff tear arthropathy 01/03/2018   Sensorineural hearing loss (SNHL) of both ears 04/26/2017   Shortness of breath 02/13/2021   Varicose veins of left lower extremity 06/11/2020    Past  Surgical History:  Procedure Laterality Date   BLADDER SUSPENSION     BREAST SURGERY Right 2008   lumpectomy   LAPAROSCOPIC HYSTERECTOMY     LYMPHADENECTOMY Right 2016    Current Medications: Current Meds  Medication Sig   aspirin EC 81 MG tablet Take 1 tablet (81 mg total) by mouth daily. Swallow whole.   b complex vitamins capsule Take 1 capsule by mouth daily.   citalopram (CELEXA) 20 MG tablet Take 1 tablet by mouth once daily   cloNIDine (CATAPRES) 0.1 MG tablet Take 1 tablet by mouth twice daily   famotidine (PEPCID) 20 MG tablet Take 1 tablet by mouth twice daily   LORazepam (ATIVAN) 1 MG tablet Take 1 tablet (1 mg total) by mouth at bedtime.   meloxicam (MOBIC) 7.5 MG tablet Take 1 tablet by mouth once daily   OVER THE COUNTER MEDICATION Once PRN. Preservative Free Moisture Eye drops.   ROCKLATAN 0.02-0.005 % SOLN Apply to eye.   verapamil (CALAN-SR) 180 MG CR tablet Take 1 tablet (180 mg total) by mouth at bedtime.     Allergies:   Bimatoprost, Levofloxacin, Penicillins, Shellfish allergy, Statins, Atorvastatin, Brimonidine, Dorzolamide hcl-timolol mal, Lovastatin, and Perindopril   Social History   Socioeconomic History   Marital status: Widowed    Spouse name: Not on file   Number of children: Not on file   Years of education: Not on file   Highest education level: Not on file  Occupational History   Occupation: Retired  Tobacco Use   Smoking status: Former   Smokeless tobacco: Never  Vaping Use   Vaping status: Never Used  Substance and Sexual Activity   Alcohol use: Yes    Alcohol/week: 3.0 standard drinks of alcohol    Types: 3 Glasses of wine per week    Comment: wine 3 x per week   Drug use: Never   Sexual activity: Not Currently  Other Topics Concern   Not on file  Social History Narrative   Grew up in [place], moved to [place] in her 20's   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   Husband died when she was in her 78's   Son  and 2 daughters- all local   Has 5 grandchildren and 3 great grandchildren   Lives by herself   No pets   Used to enjoy golf   Enjoys golf   Social Determinants of Health   Financial Resource Strain: Low Risk  (01/29/2022)   Overall Financial Resource Strain (CARDIA)    Difficulty of Paying Living Expenses: Not hard at all  Food Insecurity: No Food Insecurity (01/29/2022)   Hunger Vital Sign    Worried About Running Out of Food in the Last Year: Never true    Ran Out of Food in the Last Year: Never true  Transportation Needs: No Transportation Needs (01/29/2022)   PRAPARE - Administrator, Civil Service (Medical): No    Lack of Transportation (Non-Medical): No  Physical Activity: Sufficiently Active (01/29/2022)   Exercise Vital Sign    Days of Exercise per Week: 7 days    Minutes of Exercise per Session: 60 min  Stress: No Stress Concern  Present (01/29/2022)   [employer] of Occupational Health - Occupational Stress Questionnaire    Feeling of Stress : Not at all  Social Connections: Unknown (11/24/2022)   [facility] from [facility], [facility]   Social Network    Social Network: Not on file     Family History: The patient's family history includes Aneurysm in her brother; Cancer in her sister; Heart attack in her father; Lymphoma in her sister; Stroke in her sister.  ROS:   Review of Systems  Constitution: Negative for decreased appetite, fever and weight gain.  HENT: Negative for congestion, ear discharge, hoarse voice and sore throat.   Eyes: Negative for discharge, redness, vision loss in right eye and visual halos.  Cardiovascular: Report palpitations.  Negative for chest pain, dyspnea on exertion, leg swelling, orthopnea. Respiratory: Negative for cough, hemoptysis, shortness of breath and snoring.   Endocrine: Negative for heat intolerance and polyphagia.  Hematologic/Lymphatic: Negative for bleeding problem. Does not bruise/bleed easily.  Skin:  Negative for flushing, nail changes, rash and suspicious lesions.  Musculoskeletal: Negative for arthritis, joint pain, muscle cramps, myalgias, neck pain and stiffness.  Gastrointestinal: Negative for abdominal pain, bowel incontinence, diarrhea and excessive appetite.  Genitourinary: Negative for decreased libido, genital sores and incomplete emptying.  Neurological: Negative for brief paralysis, focal weakness, headaches and loss of balance.  Psychiatric/Behavioral: Negative for altered mental status, depression and suicidal ideas.  Allergic/Immunologic: Negative for HIV exposure and persistent infections.    EKGs/Labs/Other Studies Reviewed:    The following studies were reviewed today:   EKG:  None today   TTE 07/10/2021 IMPRESSIONS    1. Left ventricular ejection fraction, by estimation, is 65 to 70%. The  left ventricle has normal function. The left ventricle has no regional  wall motion abnormalities. There is moderate left ventricular hypertrophy.  Left ventricular diastolic  parameters are consistent with Grade I diastolic dysfunction (impaired  relaxation). Elevated left ventricular end-diastolic pressure. The E/e' is  16.   2. Right ventricular systolic function is normal. The right ventricular  size is normal.   3. The mitral valve is abnormal. Trivial mitral valve regurgitation.   4. The aortic valve is tricuspid. Aortic valve regurgitation is not  visualized. Mild aortic valve sclerosis is present, with no evidence of  aortic valve stenosis.   Comparison(s): Changes from prior study are noted. 08/08/2020: LVEF 60-65%,  aortic valve sclerosis, grade 1 DD.   FINDINGS   Left Ventricle: Left ventricular ejection fraction, by estimation, is 65  to 70%. The left ventricle has normal function. The left ventricle has no  regional wall motion abnormalities. The left ventricular internal cavity  size was normal in size. There is   moderate left ventricular hypertrophy.  Left ventricular diastolic  parameters are consistent with Grade I diastolic dysfunction (impaired  relaxation). Elevated left ventricular end-diastolic pressure. The E/e' is  16.   Right Ventricle: The right ventricular size is normal. No increase in  right ventricular wall thickness. Right ventricular systolic function is  normal.   Left Atrium: Left atrial size was normal in size.   Right Atrium: Right atrial size was normal in size.   Pericardium: There is no evidence of pericardial effusion.   Mitral Valve: The mitral valve is abnormal. There is mild thickening of  the mitral valve leaflet(s). There is mild calcification of the mitral  valve leaflet(s). Trivial mitral valve regurgitation.   Tricuspid Valve: The tricuspid valve is grossly normal. Tricuspid valve  regurgitation is trivial.  Aortic Valve: The aortic valve is tricuspid. Aortic valve regurgitation is  not visualized. Mild aortic valve sclerosis is present, with no evidence  of aortic valve stenosis.   Pulmonic Valve: The pulmonic valve was normal in structure. Pulmonic valve  regurgitation is not visualized.   Aorta: The aortic root and ascending aorta are structurally normal, with  no evidence of dilitation.   Venous: The inferior vena cava was not well visualized.   IAS/Shunts: No atrial level shunt detected by color flow Doppler.   Recent Labs: 11/12/2022: Magnesium 2.5 05/09/2023: ALT 10; BUN 23; Creatinine, Ser 0.65; Hemoglobin 12.1; Platelets 148.0; Potassium 4.6; Sodium 136  Recent Lipid Panel    Component Value Date/Time   CHOL 224 (H) 12/13/2022 1021   TRIG 159.0 (H) 12/13/2022 1021   HDL 44.90 12/13/2022 1021   CHOLHDL 5 12/13/2022 1021   VLDL 31.8 12/13/2022 1021   LDLCALC 148 (H) 12/13/2022 1021    Physical Exam:    VS:  BP 126/84   Pulse 86   Ht 5\' 4"  (1.626 m)   Wt 166 lb 6.4 oz (75.5 kg)   SpO2 94%   BMI 28.56 kg/m     Wt Readings from Last 3 Encounters:  10/07/23 166 lb 6.4  oz (75.5 kg)  07/07/23 160 lb 6.4 oz (72.8 kg)  05/09/23 160 lb 12.8 oz (72.9 kg)     GEN: Well nourished, well developed in no acute distress HEENT: Normal NECK: No JVD; No carotid bruits LYMPHATICS: No lymphadenopathy CARDIAC: S1S2 noted,RRR, no murmurs, rubs, gallops RESPIRATORY:  Clear to auscultation without rales, wheezing or rhonchi  ABDOMEN: Soft, non-tender, non-distended, +bowel sounds, no guarding. EXTREMITIES: No edema, No cyanosis, no clubbing MUSCULOSKELETAL:  No deformity  SKIN: Warm and dry NEUROLOGIC:  Alert and oriented x 3, non-focal PSYCHIATRIC:  Normal affect, good insight  ASSESSMENT:    1. PAF (paroxysmal atrial fibrillation) (HCC)   2. Frequent PAC (premature atrial contraction)   3. Essential hypertension   4. Dizziness      PLAN:    Dizziness and Balance Issues Frequent lightheadedness and imbalance, particularly upon standing. No associated tinnitus. Recent fall with walker. Discussed potential vestibular issues and the need for evaluation. -Recommend referral to primary care provider for consideration of vestibular rehabilitation. -Consider ENT consultation for potential inner ear issues.  Hypertension Patient on Clonidine and Verapamil with variable blood pressure readings at home. No clear correlation with dizziness episodes. -Continue current antihypertensive regimen. -Recommend primary care provider to review blood pressure management in light of dizziness symptoms.  Insomnia Patient on Lorazepam 1mg  at night, but still experiencing sleep issues. Concerns raised about potential contribution to dizziness and balance issues. -Consider reducing Lorazepam to 0.5mg  at night. -Recommend primary care provider to review sleep management and consider alternative sleep aids.  Premature Atrial Contractions (PACs) Patient reports occasional palpitations. Currently managed with Verapamil. -Continue current management with Verapamil. -Recommend routine  follow-up with cardiology.  General Health Maintenance / Followup Plans -Recommend follow-up with primary care provider to discuss above issues and potential changes in management. -Recommend follow-up with cardiology for routine management of PACs.  The patient is in agreement with the above plan. The patient left the office in stable condition.  The patient will follow up in 1 yr or sooner if needed.   Medication Adjustments/Labs and Tests Ordered: Current medicines are reviewed at length with the patient today.  Concerns regarding medicines are outlined above.  No orders of the defined types were placed in this  encounter.  No orders of the defined types were placed in this encounter.   Patient Instructions  Medication Instructions:  Your physician recommends that you continue on your current medications as directed. Please refer to the Current Medication list given to you today.  *If you need a refill on your cardiac medications before your next appointment, please call your pharmacy*   Follow-Up: At [facility], you and your health needs are our priority.  As part of our continuing mission to provide you with exceptional heart care, we have created designated Provider Care Teams.  These Care Teams include your primary Cardiologist (physician) and Advanced Practice Providers (APPs -  Physician Assistants and Nurse Practitioners) who all work together to provide you with the care you need, when you need it.   Your next appointment:   1 year(s)  Provider:   Thomasene Ripple, DO    Adopting a Healthy Lifestyle.  Know what a healthy weight is for you (roughly BMI <25) and aim to maintain this   Aim for 7+ servings of fruits and vegetables daily   65-80+ fluid ounces of water or unsweet tea for healthy kidneys   Limit to max 1 drink of alcohol per day; avoid smoking/tobacco   Limit animal fats in diet for cholesterol and heart health - choose grass fed whenever  available   Avoid highly processed foods, and foods high in saturated/trans fats   Aim for low stress - take time to unwind and care for your mental health   Aim for 150 min of moderate intensity exercise weekly for heart health, and weights twice weekly for bone health   Aim for 7-9 hours of sleep daily   When it comes to diets, agreement about the perfect plan isnt easy to find, even among the experts. Experts at the [facility] [employer] developed an idea known as the Healthy Eating Plate. Just imagine a plate divided into logical, healthy portions.   The emphasis is on diet quality:   Load up on vegetables and fruits - one-half of your plate: Aim for color and variety, and remember that potatoes dont count.   Go for whole grains - one-quarter of your plate: Whole wheat, barley, wheat berries, quinoa, oats, brown rice, and foods made with them. If you want pasta, go with whole wheat pasta.   Protein power - one-quarter of your plate: Fish, chicken, beans, and nuts are all healthy, versatile protein sources. Limit red meat.   The diet, however, does go beyond the plate, offering a few other suggestions.   Use healthy plant oils, such as olive, canola, soy, corn, sunflower and peanut. Check the labels, and avoid partially hydrogenated oil, which have unhealthy trans fats.   If youre thirsty, drink water. Coffee and tea are good in moderation, but skip sugary drinks and limit milk and dairy products to one or two daily servings.   The type of carbohydrate in the diet is more important than the amount. Some sources of carbohydrates, such as vegetables, fruits, whole grains, and beans-are healthier than others.   Finally, stay active  Signed, Thomasene Ripple, DO  10/10/2023 7:22 PM     [facility]

## 2023-10-12 ENCOUNTER — Telehealth: Payer: Self-pay | Admitting: Family

## 2023-10-12 MED ORDER — LORAZEPAM 1 MG PO TABS: 0.5000 mg | ORAL_TABLET | Freq: Every day | ORAL | Status: DC

## 2023-10-12 NOTE — Telephone Encounter (Signed)
-----   Message from Thomasene Ripple sent at 10/10/2023  7:30 PM EST ----- Hello Tequita Marrs,   Hope you are doing well.  I wanted to reach out to you about our mutual patient.  She is experiencing some dizziness and I see that she is on lorazepam 1 mg daily.  Which you be willing to consider another medicine to cut down on the dose yet?Carla Chandler

## 2023-10-12 NOTE — Telephone Encounter (Signed)
Patient notified of provider's message and to decrease medication dose by 1/2. She will let us know of any problems with the change.

## 2023-10-12 NOTE — Telephone Encounter (Signed)
Please advise patient that Dr. Servando Salina reached out to me about her dizziness.  It is possible that her lorazepam may be contributing to her dizziness. I would like her to cut her lorazepam in half and only take 1/2 tab by mouth instead of a full tab for now.

## 2023-10-13 ENCOUNTER — Ambulatory Visit: Payer: PPO

## 2023-10-13 VITALS — Ht 64.0 in | Wt 166.0 lb

## 2023-10-13 DIAGNOSIS — Z0000 Encounter for general adult medical examination without abnormal findings: Secondary | ICD-10-CM | POA: Diagnosis not present

## 2023-10-13 NOTE — Progress Notes (Signed)
Subjective:   Carla Chandler is a 89 y.o. female who presents for Medicare Annual (Subsequent) preventive examination.  Visit Complete: Virtual I connected with  Jackquline Bosch on 10/13/23 by a audio enabled telemedicine application and verified that I am speaking with the correct person using two identifiers.  Patient Location: Home  Provider Location: Home Office  I discussed the limitations of evaluation and management by telemedicine. The patient expressed understanding and agreed to proceed.  Vital Signs: Because this visit was a virtual/telehealth visit, some criteria may be missing or patient reported. Any vitals not documented were not able to be obtained and vitals that have been documented are patient reported.   Cardiac Risk Factors include: advanced age (>27men, >11 women);hypertension     Objective:    Today's Vitals   10/13/23 1138  Weight: 166 lb (75.3 kg)  Height: 5\' 4"  (1.626 m)   Body mass index is 28.49 kg/m.     10/13/2023   11:46 AM 01/29/2022    1:23 PM  Advanced Directives  Does Patient Have a Medical Advance Directive? Yes Yes  Type of Estate agent of [place];Living will Healthcare Power of [place];Living will;Out of facility DNR (pink MOST or yellow form)  Copy of Healthcare Power of Attorney in Chart? No - copy requested No - copy requested    Current Medications (verified) Outpatient Encounter Medications as of 10/13/2023  Medication Sig   aspirin EC 81 MG tablet Take 1 tablet (81 mg total) by mouth daily. Swallow whole.   b complex vitamins capsule Take 1 capsule by mouth daily.   citalopram (CELEXA) 20 MG tablet Take 1 tablet by mouth once daily   cloNIDine (CATAPRES) 0.1 MG tablet Take 1 tablet by mouth twice daily   famotidine (PEPCID) 20 MG tablet Take 1 tablet by mouth twice daily   LORazepam (ATIVAN) 1 MG tablet Take 0.5 tablets (0.5 mg total) by mouth at bedtime.   meloxicam (MOBIC) 7.5 MG tablet  Take 1 tablet by mouth once daily   OVER THE COUNTER MEDICATION Once PRN. Preservative Free Moisture Eye drops.   ROCKLATAN 0.02-0.005 % SOLN Apply to eye.   verapamil (CALAN-SR) 180 MG CR tablet Take 1 tablet (180 mg total) by mouth at bedtime.   No facility-administered encounter medications on file as of 10/13/2023.    Allergies (verified) Bimatoprost, Levofloxacin, Penicillins, Shellfish allergy, Statins, Atorvastatin, Brimonidine, Dorzolamide hcl-timolol mal, Lovastatin, and Perindopril   History: Past Medical History:  Diagnosis Date   Accelerated hypertension 12/04/2015   AF (paroxysmal atrial fibrillation) (HCC) 06/12/2020   Anxiety    Bilateral impacted cerumen 02/12/2018   Breast cancer (HCC)    right   Carotid artery stenosis    CN VI palsy, left 03/13/2019   Cyst of joint of shoulder 12/20/2017   Dizziness 11/12/2020   DLBCL (diffuse large B cell lymphoma) (HCC) 02/29/2016   Drusen of macula of both eyes 02/29/2016   Dry eyes, bilateral 02/29/2016   Dyslipidemia, goal LDL below 130 10/26/2016   Dysphagia 05/25/2015   Dyspnea 05/25/2015   Essential hypertension 05/27/2015   Excessive cerumen in ear canal, bilateral 04/26/2017   Frequent PAC (premature atrial contraction) 01/09/2021   Gastroesophageal reflux disease 06/12/2020   Glaucoma 06/12/2020   History of B-cell lymphoma 10/26/2016   History of breast cancer 06/12/2020   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 2013 rt radiation  with balloon   Hyperlipidemia    Hypertension    Hyponatremia 05/25/2015   Formatting of this note might be different from the original. Mild and likely 2ry to diuretic use   Idiopathic peripheral neuropathy 10/26/2016   Insomnia 06/12/2020   Knee effusion, right 01/22/2016   Laryngeal edema 05/25/2015   Lymphoma (HCC) 05/05/2015   Microcalcification of left breast on  mammogram 09/27/2017   Murmur, cardiac 07/11/2020   Osteopenia of left thigh 10/26/2016   Other visual disturbances 12/04/2015   PAF (paroxysmal atrial fibrillation) (HCC) 07/11/2020   PAT (paroxysmal atrial tachycardia) (HCC) 01/09/2021   Poor balance 06/12/2020   Primary hypertension 11/12/2020   Primary open angle glaucoma of left eye, moderate stage 02/29/2016   Primary osteoarthritis of both knees 01/22/2016   Primary osteoarthritis of both shoulders 04/12/2017   Primary osteoarthritis of left knee 01/11/2017   Pseudophakia of both eyes 02/29/2016   PVD (posterior vitreous detachment), both eyes 02/29/2016   Radiation injury 05/27/2015   Right rotator cuff tear arthropathy 01/03/2018   Sensorineural hearing loss (SNHL) of both ears 04/26/2017   Shortness of breath 02/13/2021   Varicose veins of left lower extremity 06/11/2020   Past Surgical History:  Procedure Laterality Date   BLADDER SUSPENSION     BREAST SURGERY Right 2008   lumpectomy   LAPAROSCOPIC HYSTERECTOMY     LYMPHADENECTOMY Right 2016   Family History  Problem Relation Age of Onset   Heart attack Father    Lymphoma Sister    Aneurysm Brother    Cancer Sister    Stroke Sister    Social History   Socioeconomic History   Marital status: Widowed    Spouse name: Not on file   Number of children: Not on file   Years of education: Not on file   Highest education level: Not on file  Occupational History   Occupation: Retired  Tobacco Use   Smoking status: Former   Smokeless tobacco: Never  Vaping Use   Vaping status: Never Used  Substance and Sexual Activity   Alcohol use: Yes    Alcohol/week: 3.0 standard drinks of alcohol    Types: 3 Glasses of wine per week    Comment: wine 3 x per week   Drug use: Never   Sexual activity: Not Currently  Other Topics Concern   Not on file  Social History Narrative   Grew up in [place], moved to [place] in her 20's   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   Husband died when she was in her 58's   Son and 2 daughters- all local   Has 5 grandchildren and 3 great grandchildren   Lives by herself   No pets   Used to enjoy golf   Enjoys golf   Social Determinants of Health   Financial Resource Strain: Low Risk  (10/13/2023)   Overall Financial Resource Strain (CARDIA)    Difficulty of Paying Living Expenses: Not hard at all  Food Insecurity: No Food Insecurity (10/13/2023)   Hunger Vital Sign    Worried About Running Out of Food in the Last Year: Never true    Ran Out of Food in the Last Year: Never true  Transportation Needs: No Transportation Needs (10/13/2023)   PRAPARE - Administrator, Civil Service (Medical): No    Lack of Transportation (Non-Medical): No  Physical Activity: Inactive (10/13/2023)   Exercise Vital Sign    Days of Exercise  per Week: 0 days    Minutes of Exercise per Session: 0 min  Stress: No Stress Concern Present (10/13/2023)   [employer] of Occupational Health - Occupational Stress Questionnaire    Feeling of Stress : Not at all  Social Connections: Moderately Integrated (10/13/2023)   Social Connection and Isolation Panel [NHANES]    Frequency of Communication with Friends and Family: More than three times a week    Frequency of Social Gatherings with Friends and Family: More than three times a week    Attends Religious Services: More than 4 times per year    Active Member of [employer] or Organizations: Yes    Attends Banker Meetings: More than 4 times per year    Marital Status: Widowed    Tobacco Counseling Counseling given: Not Answered   Clinical Intake:  Pre-visit preparation completed: Yes  Pain : No/denies pain     BMI - recorded: 28.49 Nutritional Status: BMI 25 -29 Overweight Nutritional Risks: None Diabetes: No  How often do you need to have someone help you when you read instructions, pamphlets, or other written materials from your  doctor or pharmacy?: 1 - Never  Interpreter Needed?: No  Information entered by :: Theresa Mulligan LPN   Activities of Daily Living    10/13/2023   11:44 AM  In your present state of health, do you have any difficulty performing the following activities:  Hearing? 0  Vision? 0  Difficulty concentrating or making decisions? 0  Walking or climbing stairs? 1  Comment Uses a walker  Dressing or bathing? 0  Doing errands, shopping? 0  Preparing Food and eating ? N  Using the Toilet? N  In the past six months, have you accidently leaked urine? N  Do you have problems with loss of bowel control? N  Managing your Medications? N  Managing your Finances? N  Housekeeping or managing your Housekeeping? N    Patient Care Team: Sandford Craze, NP as PCP - General (Internal Medicine) Thomasene Ripple, DO as PCP - Cardiology (Cardiology)  Indicate any recent Medical Services you may have received from other than [facility] providers in the past year (date may be approximate).     Assessment:   This is a routine wellness examination for Shakeya.  Hearing/Vision screen Hearing Screening - Comments:: Denies hearing difficulties   Vision Screening - Comments:: Wears rx glasses - up to date with routine eye exams with  [facility]   Goals Addressed               This Visit's Progress     Stay Active (pt-stated)         Depression Screen    10/13/2023   11:43 AM 12/03/2022   11:01 AM 01/29/2022    1:20 PM 04/10/2021    9:13 AM 06/11/2020   10:18 AM  PHQ 2/9 Scores  PHQ - 2 Score 0 3 0 2 0  PHQ- 9 Score  10       Fall Risk    10/13/2023   11:45 AM 11/23/2022    4:08 PM 01/29/2022    1:18 PM 04/10/2021    9:13 AM  Fall Risk   Falls in the past year? 1 0 0 1  Number falls in past yr: 0 0 0 1  Injury with Fall? 0 0 0 0  Risk for fall due to : No Fall Risks  No Fall Risks   Follow up Falls prevention discussed  Falls  evaluation completed     MEDICARE RISK AT HOME: Medicare  Risk at Home Any stairs in or around the home?: Yes If so, are there any without handrails?: No Home free of loose throw rugs in walkways, pet beds, electrical cords, etc?: Yes Adequate lighting in your home to reduce risk of falls?: Yes Life alert?: No Use of a cane, walker or w/c?: Yes Grab bars in the bathroom?: Yes Shower chair or bench in shower?: Yes Elevated toilet seat or a handicapped toilet?: Yes  TIMED UP AND GO:  Was the test performed?  No    Cognitive Function:        10/13/2023   11:46 AM 01/29/2022    1:25 PM  6CIT Screen  What Year? 0 points 0 points  What month? 0 points 0 points  What time? 0 points 0 points  Count back from 20 0 points 0 points  Months in reverse 0 points 0 points  Repeat phrase 0 points 4 points  Total Score 0 points 4 points    Immunizations Immunization History  Administered Date(s) Administered   PFIZER(Purple Top)SARS-COV-2 Vaccination 01/05/2020, 01/30/2020, 09/26/2020   Pfizer Covid-19 Vaccine Bivalent Booster 42yrs & up 11/06/2021   Pneumococcal Conjugate-13 12/24/2004   Pneumococcal Polysaccharide-23 11/23/2004    TDAP status: Due, Education has been provided regarding the importance of this vaccine. Advised may receive this vaccine at local pharmacy or Health Dept. Aware to provide a copy of the vaccination record if obtained from local pharmacy or Health Dept. Verbalized acceptance and understanding.  Flu Vaccine status: Declined, Education has been provided regarding the importance of this vaccine but patient still declined. Advised may receive this vaccine at local pharmacy or Health Dept. Aware to provide a copy of the vaccination record if obtained from local pharmacy or Health Dept. Verbalized acceptance and understanding.  Pneumococcal vaccine status: Up to date  Covid-19 vaccine status: Declined, Education has been provided regarding the importance of this vaccine but patient still declined. Advised may receive this  vaccine at local pharmacy or Health Dept.or vaccine clinic. Aware to provide a copy of the vaccination record if obtained from local pharmacy or Health Dept. Verbalized acceptance and understanding.  Qualifies for Shingles Vaccine? Yes   Zostavax completed No   Shingrix Completed?: No.    Education has been provided regarding the importance of this vaccine. Patient has been advised to call insurance company to determine out of pocket expense if they have not yet received this vaccine. Advised may also receive vaccine at local pharmacy or Health Dept. Verbalized acceptance and understanding.  Screening Tests Health Maintenance  Topic Date Due   DTaP/Tdap/Td (1 - Tdap) Never done   Zoster Vaccines- Shingrix (1 of 2) Never done   DEXA SCAN  Never done   INFLUENZA VACCINE  Never done   COVID-19 Vaccine (5 - 2023-24 season) 07/24/2023   Medicare Annual Wellness (AWV)  10/12/2024   Pneumonia Vaccine 58+ Years old  Completed   HPV VACCINES  Aged Out    Health Maintenance  Health Maintenance Due  Topic Date Due   DTaP/Tdap/Td (1 - Tdap) Never done   Zoster Vaccines- Shingrix (1 of 2) Never done   DEXA SCAN  Never done   INFLUENZA VACCINE  Never done   COVID-19 Vaccine (5 - 2023-24 season) 07/24/2023        Bone Density status: Ordered Deferred. Pt provided with contact info and advised to call to schedule appt.     Vision  Screening: Recommended annual ophthalmology exams for early detection of glaucoma and other disorders of the eye. Is the patient up to date with their annual eye exam?  Yes  Who is the provider or what is the name of the office in which the patient attends annual eye exams? [facility] If pt is not established with a provider, would they like to be referred to a provider to establish care? No .   Dental Screening: Recommended annual dental exams for proper oral hygiene    Community Resource Referral / Chronic Care Management:  CRR required this visit?   No   CCM required this visit?  No     Plan:     I have personally reviewed and noted the following in the patient's chart:   Medical and social history Use of alcohol, tobacco or illicit drugs  Current medications and supplements including opioid prescriptions. Patient is currently taking opioid prescriptions. Information provided to patient regarding non-opioid alternatives. Patient advised to discuss non-opioid treatment plan with their provider. Functional ability and status Nutritional status Physical activity Advanced directives List of other physicians Hospitalizations, surgeries, and ER visits in previous 12 months Vitals Screenings to include cognitive, depression, and falls Referrals and appointments  In addition, I have reviewed and discussed with patient certain preventive protocols, quality metrics, and best practice recommendations. A written personalized care plan for preventive services as well as general preventive health recommendations were provided to patient.     Tillie Rung, LPN   [id number]   After Visit Summary: (MyChart) Due to this being a telephonic visit, the after visit summary with patients personalized plan was offered to patient via MyChart   Nurse Notes: None

## 2023-10-13 NOTE — Patient Instructions (Addendum)
Ms. Trench , Thank you for taking time to come for your Medicare Wellness Visit. I appreciate your ongoing commitment to your health goals. Please review the following plan we discussed and let me know if I can assist you in the future.   Referrals/Orders/Follow-Ups/Clinician Recommendations:   This is a list of the screening recommended for you and due dates:  Health Maintenance  Topic Date Due   DTaP/Tdap/Td vaccine (1 - Tdap) Never done   Zoster (Shingles) Vaccine (1 of 2) Never done   DEXA scan (bone density measurement)  Never done   Flu Shot  Never done   COVID-19 Vaccine (5 - 2023-24 season) 07/24/2023   Medicare Annual Wellness Visit  10/12/2024   Pneumonia Vaccine  Completed   HPV Vaccine  Aged Out    Advanced directives: (Copy Requested) Please bring a copy of your health care power of attorney and living will to the office to be added to your chart at your convenience.  Next Medicare Annual Wellness Visit scheduled for next year: Yes

## 2023-10-28 ENCOUNTER — Telehealth: Payer: Self-pay

## 2023-10-28 NOTE — Telephone Encounter (Signed)
Kimber Relic R "Patty"  P [facility] Clinical Pool Phone Number: [phone]   Hi, hope you are doing well. Sending this for mom, Virjinia Mantey. Mom want to know how long to take the half pill of larazopam as you are weaning her off of it. She wants to know if she can take an over the counter sleep aid and if so, what do you suggest? And last, Dr. Servando Salina sent you something in her notes about her dizziness and the possibility of an adjustment in the small bones under her ear? Not sure if I have that exactly correct. How does she go about following up on this? Take care and have a nice weekend, [employer]

## 2023-10-28 NOTE — Telephone Encounter (Signed)
I would recommend that she continue the half dose of lorazepam for now.  If dizziness persists on this lower dose, please schedule a follow up visit with me so that we can discuss next steps.

## 2023-10-28 NOTE — Telephone Encounter (Signed)
Message sent back via mychart.

## 2023-11-07 ENCOUNTER — Other Ambulatory Visit: Payer: Self-pay | Admitting: Family

## 2023-12-02 DIAGNOSIS — H5203 Hypermetropia, bilateral: Secondary | ICD-10-CM | POA: Diagnosis not present

## 2023-12-02 DIAGNOSIS — H401111 Primary open-angle glaucoma, right eye, mild stage: Secondary | ICD-10-CM | POA: Diagnosis not present

## 2023-12-02 DIAGNOSIS — H538 Other visual disturbances: Secondary | ICD-10-CM | POA: Diagnosis not present

## 2023-12-02 DIAGNOSIS — H43813 Vitreous degeneration, bilateral: Secondary | ICD-10-CM | POA: Diagnosis not present

## 2023-12-02 DIAGNOSIS — H04123 Dry eye syndrome of bilateral lacrimal glands: Secondary | ICD-10-CM | POA: Diagnosis not present

## 2023-12-02 DIAGNOSIS — H52223 Regular astigmatism, bilateral: Secondary | ICD-10-CM | POA: Diagnosis not present

## 2023-12-02 DIAGNOSIS — Z961 Presence of intraocular lens: Secondary | ICD-10-CM | POA: Diagnosis not present

## 2023-12-02 DIAGNOSIS — H524 Presbyopia: Secondary | ICD-10-CM | POA: Diagnosis not present

## 2023-12-02 DIAGNOSIS — H35363 Drusen (degenerative) of macula, bilateral: Secondary | ICD-10-CM | POA: Diagnosis not present

## 2023-12-02 DIAGNOSIS — H401122 Primary open-angle glaucoma, left eye, moderate stage: Secondary | ICD-10-CM | POA: Diagnosis not present

## 2023-12-07 ENCOUNTER — Telehealth: Payer: Self-pay

## 2023-12-07 NOTE — Telephone Encounter (Signed)
 Copied from CRM [id number]. Topic: Clinical - Medication Question >> Dec 07, 2023  2:57 PM Corin V wrote: Reason for CRM: Patient called in regarding medication issued and made an appointment for next week to discuss with Melissa O'Sullivan. Se wanted Melissa to have an idea of the issues. She needs to know if she will contineu to take the LORazepam  (ATIVAN ) 1 MG tablet or if she is being tapered off of that since the recent Rx was for a half dose. She used to take omeprazole  for stomach until it stopped working. She was put on the famotidine  (PEPCID ) 20 MG tablet. She feels it is not working for her and she is feeling sick to her stomach because of this. IS there an alternate medication she can try? She is dealing with dizziness as well and her optometrist stated it was not due to eye issues. Is there something she can try to relive this?

## 2023-12-07 NOTE — Telephone Encounter (Signed)
 Noted.  Will plan to discuss in further detail at her upcoming visit.

## 2023-12-08 ENCOUNTER — Other Ambulatory Visit: Payer: Self-pay | Admitting: Family

## 2023-12-08 NOTE — Telephone Encounter (Signed)
This information was forwarded to provider

## 2023-12-09 DIAGNOSIS — L219 Seborrheic dermatitis, unspecified: Secondary | ICD-10-CM | POA: Diagnosis not present

## 2023-12-09 DIAGNOSIS — L400 Psoriasis vulgaris: Secondary | ICD-10-CM | POA: Diagnosis not present

## 2023-12-13 ENCOUNTER — Telehealth: Payer: PPO | Admitting: Family

## 2023-12-13 ENCOUNTER — Other Ambulatory Visit: Payer: Self-pay | Admitting: Family

## 2023-12-13 DIAGNOSIS — R350 Frequency of micturition: Secondary | ICD-10-CM | POA: Insufficient documentation

## 2023-12-13 DIAGNOSIS — Z66 Do not resuscitate: Secondary | ICD-10-CM | POA: Diagnosis not present

## 2023-12-13 DIAGNOSIS — R110 Nausea: Secondary | ICD-10-CM | POA: Diagnosis not present

## 2023-12-13 DIAGNOSIS — R42 Dizziness and giddiness: Secondary | ICD-10-CM

## 2023-12-13 DIAGNOSIS — C8331 Diffuse large B-cell lymphoma, lymph nodes of head, face, and neck: Secondary | ICD-10-CM

## 2023-12-13 MED ORDER — SULFAMETHOXAZOLE-TRIMETHOPRIM 800-160 MG PO TABS: 1.0000 | ORAL_TABLET | Freq: Two times a day (BID) | ORAL | 0 refills | Status: DC

## 2023-12-13 NOTE — Assessment & Plan Note (Signed)
Increased frequency of urination at night, up to 3-4 times. This is a new symptom for the patient. -Start Bactrim for possible urinary tract infection. -Order urine culture

## 2023-12-13 NOTE — Assessment & Plan Note (Signed)
  Patient reports constant queasiness, not clearly related to meals or medication. No vomiting. Patient has been trying Omeprazole without significant improvement. -Discontinue Omeprazole and continue Famotidine 20mg  twice daily. -offered antiemtic but she declined. -If labs are normal and nausea persists, consider further evaluation.

## 2023-12-13 NOTE — Progress Notes (Unsigned)
MyChart Video Visit    Virtual Visit via Video Note    Patient location: Home. Patient and provider in visit Provider location: Office  I discussed the limitations of evaluation and management by telemedicine and the availability of in person appointments. The patient expressed understanding and agreed to proceed.  Visit Date: 12/13/2023  Today's healthcare provider: Lemont Fillers, NP     Subjective:    Patient ID: Carla Chandler, female    DOB: 1925-11-28, 89 y.o.   MRN: [id number]  Chief Complaint  Patient presents with   Urinary Frequency    Patient reports frequent urination   Dizziness    Complains of dizziness    Nausea    Complains of feeling nauseous, worse after eating.    HPI Carla Chandler presents today for follow up. She reports worsening dizziness and lightheadedness. She reports feeling "wavy" even when sitting and has to be extremely careful when walking due to balance issues. She also mentions feeling queasy all the time, both before and after meals, but does not feel like vomiting. Dizziness is a long standing problem. She has done some vestibular rehab and tried to do some of the exercises at home.   In addition to the dizziness, Carla Chandler has been experiencing an increased frequency of urination, waking up every two hours at night to go to the bathroom. This is a change from her usual pattern of waking up once or twice during the night.  Carla Chandler has been on lorazepam and citalopram, taking them at bedtime. She has been trying to taper off lorazepam, cutting the pills in half, and reports not noticing any difference in her dizziness. She also tried taking omeprazole for the queasiness but did not find it helpful.   Past Medical History:  Diagnosis Date   Accelerated hypertension 12/04/2015   AF (paroxysmal atrial fibrillation) (HCC) 06/12/2020   Anxiety    Bilateral impacted cerumen 02/12/2018   Breast cancer (HCC)    right   Carotid artery  stenosis    CN VI palsy, left 03/13/2019   Cyst of joint of shoulder 12/20/2017   Dizziness 11/12/2020   DLBCL (diffuse large B cell lymphoma) (HCC) 02/29/2016   Drusen of macula of both eyes 02/29/2016   Dry eyes, bilateral 02/29/2016   Dyslipidemia, goal LDL below 130 10/26/2016   Dysphagia 05/25/2015   Dyspnea 05/25/2015   Essential hypertension 05/27/2015   Excessive cerumen in ear canal, bilateral 04/26/2017   Frequent PAC (premature atrial contraction) 01/09/2021   Gastroesophageal reflux disease 06/12/2020   Glaucoma 06/12/2020   History of B-cell lymphoma 10/26/2016   History of breast cancer 06/12/2020   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 2013 rt radiation with balloon   Hyperlipidemia    Hypertension    Hyponatremia 05/25/2015   Formatting of this note might be different from the original. Mild and likely 2ry to diuretic use   Idiopathic peripheral neuropathy 10/26/2016   Insomnia 06/12/2020   Knee effusion, right 01/22/2016   Laryngeal edema 05/25/2015   Lymphoma (HCC) 05/05/2015   Microcalcification of left breast on mammogram 09/27/2017   Murmur, cardiac 07/11/2020   Osteopenia of left thigh 10/26/2016   Other visual disturbances 12/04/2015   PAF (paroxysmal atrial fibrillation) (HCC) 07/11/2020   PAT (paroxysmal atrial tachycardia) (HCC) 01/09/2021   Poor balance 06/12/2020   Primary hypertension 11/12/2020   Primary open angle glaucoma of  left eye, moderate stage 02/29/2016   Primary osteoarthritis of both knees 01/22/2016   Primary osteoarthritis of both shoulders 04/12/2017   Primary osteoarthritis of left knee 01/11/2017   Pseudophakia of both eyes 02/29/2016   PVD (posterior vitreous detachment), both eyes 02/29/2016   Radiation injury 05/27/2015   Right rotator cuff tear arthropathy 01/03/2018   Sensorineural hearing loss (SNHL) of both ears  04/26/2017   Shortness of breath 02/13/2021   Varicose veins of left lower extremity 06/11/2020    Past Surgical History:  Procedure Laterality Date   BLADDER SUSPENSION     BREAST SURGERY Right 2008   lumpectomy   LAPAROSCOPIC HYSTERECTOMY     LYMPHADENECTOMY Right 2016    Family History  Problem Relation Age of Onset   Heart attack Father    Lymphoma Sister    Aneurysm Brother    Cancer Sister    Stroke Sister     Social History   Socioeconomic History   Marital status: Widowed    Spouse name: Not on file   Number of children: Not on file   Years of education: Not on file   Highest education level: Not on file  Occupational History   Occupation: Retired  Tobacco Use   Smoking status: Former   Smokeless tobacco: Never  Vaping Use   Vaping status: Never Used  Substance and Sexual Activity   Alcohol use: Yes    Alcohol/week: 3.0 standard drinks of alcohol    Types: 3 Glasses of wine per week    Comment: wine 3 x per week   Drug use: Never   Sexual activity: Not Currently  Other Topics Concern   Not on file  Social History Narrative   Grew up in [place], moved to [place] in her 20's   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   Husband died when she was in her 39's   Son and 2 daughters- all local   Has 5 grandchildren and 3 great grandchildren   Lives by herself   No pets   Used to enjoy golf   Enjoys golf   Social Drivers of Corporate investment banker Strain: Low Risk  (10/13/2023)   Overall Financial Resource Strain (CARDIA)    Difficulty of Paying Living Expenses: Not hard at all  Food Insecurity: No Food Insecurity (10/13/2023)   Hunger Vital Sign    Worried About Running Out of Food in the Last Year: Never true    Ran Out of Food in the Last Year: Never true  Transportation Needs: No Transportation Needs (10/13/2023)   PRAPARE - Administrator, Civil Service (Medical): No    Lack of Transportation (Non-Medical):  No  Physical Activity: Inactive (10/13/2023)   Exercise Vital Sign    Days of Exercise per Week: 0 days    Minutes of Exercise per Session: 0 min  Stress: No Stress Concern Present (10/13/2023)   [employer] of Occupational Health - Occupational Stress Questionnaire    Feeling of Stress : Not at all  Social Connections: Moderately Integrated (10/13/2023)   Social Connection and Isolation Panel [NHANES]    Frequency of Communication with Friends and Family: More than three times a week    Frequency of Social Gatherings with Friends and Family: More than three times a week    Attends Religious Services: More than 4 times per year    Active Member of [employer] or Organizations: Yes    Attends [employer]  or Organization Meetings: More than 4 times per year    Marital Status: Widowed  Intimate Partner Violence: Not At Risk (10/13/2023)   Humiliation, Afraid, Rape, and Kick questionnaire    Fear of Current or Ex-Partner: No    Emotionally Abused: No    Physically Abused: No    Sexually Abused: No    Outpatient Medications Prior to Visit  Medication Sig Dispense Refill   aspirin EC 81 MG tablet Take 1 tablet (81 mg total) by mouth daily. Swallow whole. 30 tablet 12   b complex vitamins capsule Take 1 capsule by mouth daily.     famotidine (PEPCID) 20 MG tablet Take 1 tablet by mouth twice daily 180 tablet 0   LORazepam (ATIVAN) 1 MG tablet Take 0.5 tablets (0.5 mg total) by mouth at bedtime.     meloxicam (MOBIC) 7.5 MG tablet Take 1 tablet by mouth once daily 30 tablet 0   OVER THE COUNTER MEDICATION Once PRN. Preservative Free Moisture Eye drops.     ROCKLATAN 0.02-0.005 % SOLN Apply to eye.     verapamil (CALAN-SR) 180 MG CR tablet Take 1 tablet (180 mg total) by mouth at bedtime. 90 tablet 3   citalopram (CELEXA) 20 MG tablet Take 1 tablet by mouth once daily 90 tablet 0   cloNIDine (CATAPRES) 0.1 MG tablet Take 1 tablet by mouth twice daily 180 tablet 0   No facility-administered  medications prior to visit.    Allergies  Allergen Reactions   Bimatoprost Other (See Comments)    toxicity   Levofloxacin Diarrhea   Penicillins Itching   Shellfish Allergy Other (See Comments)   Statins Other (See Comments)    Myalgias - "all statins"  Per pt   Atorvastatin Other (See Comments)   Brimonidine Other (See Comments)   Dorzolamide Hcl-Timolol Mal Other (See Comments)   Lovastatin Other (See Comments)   Perindopril Other (See Comments)    ROS See HPI    BP Readings from Last 3 Encounters:  10/07/23 126/84  07/07/23 (!) 110/52  05/09/23 110/72    Gen: Awake, alert, no acute distress Resp: Breathing is even and non-labored Psych: calm/pleasant demeanor Neuro: Alert and Oriented x 3, + facial symmetry, speech is clear.  Objective:    Physical Exam Constitutional:      General: She is not in acute distress.    Appearance: Normal appearance. She is well-developed.  HENT:     Head: Normocephalic and atraumatic.     Right Ear: External ear normal.     Left Ear: External ear normal.  Eyes:     General: No scleral icterus. Pulmonary:     Effort: No respiratory distress.  Skin:    General: Skin is warm and dry.  Neurological:     Mental Status: She is alert and oriented to person, place, and time.  Psychiatric:        Mood and Affect: Mood normal.        Behavior: Behavior normal.        Thought Content: Thought content normal.        Judgment: Judgment normal.     There were no vitals taken for this visit. Wt Readings from Last 3 Encounters:  10/13/23 166 lb (75.3 kg)  10/07/23 166 lb 6.4 oz (75.5 kg)  07/07/23 160 lb 6.4 oz (72.8 kg)       Assessment & Plan:   Problem List Items Addressed This Visit       Unprioritized  Urinary frequency   Increased frequency of urination at night, up to 3-4 times. This is a new symptom for the patient. -Start Bactrim for possible urinary tract infection. -Order urine culture      Relevant Orders    Urine Culture   Nausea - Primary    Patient reports constant queasiness, not clearly related to meals or medication. No vomiting. Patient has been trying Omeprazole without significant improvement. -Discontinue Omeprazole and continue Famotidine 20mg  twice daily. -offered antiemtic but she declined. -If labs are normal and nausea persists, consider further evaluation.      Relevant Orders   CBC w/Diff   Comp Met (CMET)   Lipase   DNR (do not resuscitate)   States she received the DNR form we mailed her and placed it on her refrigerator.       DLBCL (diffuse large B cell lymphoma) (HCC)   Notes a "lump" in her neck but states that "if my cancer has come back I wouldn't treat it."       Relevant Medications   sulfamethoxazole-trimethoprim (BACTRIM DS) 800-160 MG tablet   Dizziness    Patient reports worsening dizziness and lightheadedness, affecting balance. No clear cause identified. Patient has a history of 60% carotid artery blockage but is not interested in surgical intervention. We discussed that carotid or other arterial blockage in her brain could be a cause but she does not wish to further evaluation. -Continue to monitor symptoms. Patient to be cautious with mobility due to balance issues.        I am having Carla Chandler. Carla Chandler start on sulfamethoxazole-trimethoprim. I am also having her maintain her b complex vitamins, Rocklatan, OVER THE COUNTER MEDICATION, verapamil, aspirin EC, famotidine, LORazepam, and meloxicam.  Meds ordered this encounter  Medications   sulfamethoxazole-trimethoprim (BACTRIM DS) 800-160 MG tablet    Sig: Take 1 tablet by mouth 2 (two) times daily.    Dispense:  10 tablet    Refill:  0    Supervising Provider:   Danise Edge A [[id number]]    I discussed the assessment and treatment plan with the patient. The patient was provided an opportunity to ask questions and all were answered. The patient agreed with the plan and demonstrated an  understanding of the instructions.   The patient was advised to call back or seek an in-person evaluation if the symptoms worsen or if the condition fails to improve as anticipated.   Lemont Fillers, NP [facility] [phone] (phone) [phone] (fax)  [facility]

## 2023-12-13 NOTE — Assessment & Plan Note (Signed)
Notes a "lump" in her neck but states that "if my cancer has come back I wouldn't treat it."

## 2023-12-13 NOTE — Assessment & Plan Note (Signed)
  Patient reports worsening dizziness and lightheadedness, affecting balance. No clear cause identified. Patient has a history of 60% carotid artery blockage but is not interested in surgical intervention. We discussed that carotid or other arterial blockage in her brain could be a cause but she does not wish to further evaluation. -Continue to monitor symptoms. Patient to be cautious with mobility due to balance issues.

## 2023-12-14 DIAGNOSIS — Z66 Do not resuscitate: Secondary | ICD-10-CM | POA: Insufficient documentation

## 2023-12-14 NOTE — Assessment & Plan Note (Signed)
States she received the DNR form we mailed her and placed it on her refrigerator.

## 2023-12-14 NOTE — Patient Instructions (Signed)
VISIT SUMMARY:  During today's visit, we discussed your ongoing issues with dizziness, lightheadedness, increased nighttime urination, and constant queasiness. We reviewed your current medications and made some adjustments to better manage your symptoms.  YOUR PLAN:  -ANXIETY: Anxiety is a feeling of worry or fear that can affect daily activities. You have been tapering off Lorazepam and have not noticed any significant changes in your symptoms. Continue taking Lorazepam 0.5mg  at bedtime as needed for sleep, and decide if you need to continue based on your sleep quality.  -DIZZINESS AND LIGHTHEADEDNESS: Dizziness and lightheadedness can affect your balance and make you feel unsteady. Since no clear cause has been identified, continue to monitor your symptoms and be cautious with your movements to avoid falls.  -NOCTURIA: Nocturia is the need to wake up frequently at night to urinate. To address this, we will start you on Bactrim for a possible urinary tract infection and have ordered a urine culture and liver function tests to investigate further.  -NAUSEA: Nausea is a feeling of queasiness or an upset stomach. Since Omeprazole has not been effective, we will discontinue it and continue Famotidine 20mg  twice daily. If your lab results are normal and the nausea persists, we will consider further evaluation.  INSTRUCTIONS:  Please schedule a lab appointment for next week to complete the urine culture and liver function tests. We will have an in-person follow-up visit in 3 months.

## 2023-12-16 ENCOUNTER — Other Ambulatory Visit (INDEPENDENT_AMBULATORY_CARE_PROVIDER_SITE_OTHER): Payer: PPO

## 2023-12-16 DIAGNOSIS — L400 Psoriasis vulgaris: Secondary | ICD-10-CM | POA: Diagnosis not present

## 2023-12-16 DIAGNOSIS — Z111 Encounter for screening for respiratory tuberculosis: Secondary | ICD-10-CM | POA: Diagnosis not present

## 2023-12-16 DIAGNOSIS — R110 Nausea: Secondary | ICD-10-CM

## 2023-12-16 DIAGNOSIS — R350 Frequency of micturition: Secondary | ICD-10-CM

## 2023-12-16 LAB — CBC WITH DIFFERENTIAL/PLATELET
Basophils Absolute: 0 10*3/uL (ref 0.0–0.1)
Basophils Relative: 0.9 % (ref 0.0–3.0)
Eosinophils Absolute: 0.1 10*3/uL (ref 0.0–0.7)
Eosinophils Relative: 2.3 % (ref 0.0–5.0)
HCT: 39 % (ref 36.0–46.0)
Hemoglobin: 12.9 g/dL (ref 12.0–15.0)
Lymphocytes Relative: 18.7 % (ref 12.0–46.0)
Lymphs Abs: 0.8 10*3/uL (ref 0.7–4.0)
MCHC: 33.2 g/dL (ref 30.0–36.0)
MCV: 102.3 fL — ABNORMAL HIGH (ref 78.0–100.0)
Monocytes Absolute: 0.4 10*3/uL (ref 0.1–1.0)
Monocytes Relative: 8.6 % (ref 3.0–12.0)
Neutro Abs: 3 10*3/uL (ref 1.4–7.7)
Neutrophils Relative %: 69.5 % (ref 43.0–77.0)
Platelets: 156 10*3/uL (ref 150.0–400.0)
RBC: 3.81 Mil/uL — ABNORMAL LOW (ref 3.87–5.11)
RDW: 13.7 % (ref 11.5–15.5)
WBC: 4.3 10*3/uL (ref 4.0–10.5)

## 2023-12-16 LAB — COMPREHENSIVE METABOLIC PANEL
ALT: 12 U/L (ref 0–35)
AST: 15 U/L (ref 0–37)
Albumin: 4.2 g/dL (ref 3.5–5.2)
Alkaline Phosphatase: 49 U/L (ref 39–117)
BUN: 21 mg/dL (ref 6–23)
CO2: 31 meq/L (ref 19–32)
Calcium: 9.1 mg/dL (ref 8.4–10.5)
Chloride: 100 meq/L (ref 96–112)
Creatinine, Ser: 0.61 mg/dL (ref 0.40–1.20)
GFR: 75.67 mL/min (ref >60.00)
Glucose, Bld: 115 mg/dL — ABNORMAL HIGH (ref 70–99)
Potassium: 4.6 meq/L (ref 3.5–5.1)
Sodium: 137 meq/L (ref 135–145)
Total Bilirubin: 0.5 mg/dL (ref 0.2–1.2)
Total Protein: 7 g/dL (ref 6.0–8.3)

## 2023-12-17 LAB — URINE CULTURE
MICRO NUMBER:: 15994485
Result:: NO GROWTH
SPECIMEN QUALITY:: ADEQUATE

## 2023-12-18 ENCOUNTER — Encounter: Payer: Self-pay | Admitting: Family

## 2023-12-26 ENCOUNTER — Other Ambulatory Visit: Payer: Self-pay | Admitting: Family

## 2024-01-02 ENCOUNTER — Telehealth: Payer: Self-pay | Admitting: Emergency Medicine

## 2024-01-02 NOTE — Telephone Encounter (Signed)
 Copied from CRM [id number]. Topic: General - Other >> Jan 02, 2024  4:10 PM Carla Chandler wrote: Reason for CRM: Patient called in wanting to speak with [name] regarding her medication before she begins to take it again when it is refilled. Is requesting a callback on this matter

## 2024-01-03 MED ORDER — CITALOPRAM HYDROBROMIDE 20 MG PO TABS: 20.0000 mg | ORAL_TABLET | Freq: Every day | ORAL | 1 refills | Status: DC

## 2024-01-03 NOTE — Addendum Note (Signed)
Addended by: Sandford Craze on: 01/03/2024 03:18 PM   Modules accepted: Orders

## 2024-01-05 ENCOUNTER — Other Ambulatory Visit: Payer: Self-pay | Admitting: Cardiology

## 2024-01-14 ENCOUNTER — Other Ambulatory Visit: Payer: Self-pay | Admitting: Family

## 2024-01-30 ENCOUNTER — Ambulatory Visit: Payer: Self-pay | Admitting: Family

## 2024-01-30 NOTE — Telephone Encounter (Signed)
  Chief Complaint: back pain Symptoms: lower back pain Frequency: for 3 weeks Pertinent Negatives: Patient denies fever, sob Disposition: [] ED /[] Urgent Care (no appt availability in office) / [x] Appointment(In office/virtual)/ []  [facility] [place] [facility]/ [] Home Care/ [] Refused Recommended Disposition /[] [facility]/ []  Follow-up with PCP Additional Notes: Patient states that she has been having back pain on the left side right above her waist for about 3 weeks and it is difficult for her to lay down to sleep.  Pain not as bad when she is sitting up. She doesn't recall doing anything to cause an injury. Pain can range from 4/10-8/10 depending on her position.    Copied from CRM [id number]. Topic: Clinical - Red Word Triage >> Jan 30, 2024 12:08 PM Gurney Maxin H wrote: Red Word that prompted transfer to Nurse Triage: Pain in lower back side above waistline on right side. Sitting up it's not as bad but when she lays down it hurts Reason for Disposition  [1] MODERATE back pain (e.g., interferes with normal activities) AND [2] present > 3 days  Answer Assessment - Initial Assessment Questions 1. ONSET: "When did the pain begin?"      Couple of weeks 2. LOCATION: "Where does it hurt?" (upper, mid or lower back)     Right lower back  3. SEVERITY: "How bad is the pain?"  (e.g., Scale 1-10; mild, moderate, or severe)   - MILD (1-3): Doesn't interfere with normal activities.    - MODERATE (4-7): Interferes with normal activities or awakens from sleep.    - SEVERE (8-10): Excruciating pain, unable to do any normal activities.      8 4. PATTERN: "Is the pain constant?" (e.g., yes, no; constant, intermittent)      constant 5. RADIATION: "Does the pain shoot into your legs or somewhere else?"     One area 6. CAUSE:  "What do you think is causing the back pain?"      Thought she may have pulled something 7. BACK OVERUSE:  "Any recent lifting of heavy objects, strenuous work or exercise?"      no 8. MEDICINES: "What have you taken so far for the pain?" (e.g., nothing, acetaminophen, NSAIDS)    Acetaminophen, ibuprofen.  9. NEUROLOGIC SYMPTOMS: "Do you have any weakness, numbness, or problems with bowel/bladder control?"     no 10. OTHER SYMPTOMS: "Do you have any other symptoms?" (e.g., fever, abdomen pain, burning with urination, blood in urine)       no  Protocols used: Back Pain-A-AH

## 2024-01-31 ENCOUNTER — Encounter: Payer: Self-pay | Admitting: Family Medicine

## 2024-01-31 ENCOUNTER — Ambulatory Visit (INDEPENDENT_AMBULATORY_CARE_PROVIDER_SITE_OTHER): Admitting: Family Medicine

## 2024-01-31 VITALS — BP 138/82 | HR 60 | Resp 14 | Ht 64.0 in | Wt 167.2 lb

## 2024-01-31 DIAGNOSIS — R229 Localized swelling, mass and lump, unspecified: Secondary | ICD-10-CM | POA: Diagnosis not present

## 2024-01-31 DIAGNOSIS — M545 Low back pain, unspecified: Secondary | ICD-10-CM | POA: Diagnosis not present

## 2024-01-31 NOTE — Patient Instructions (Addendum)

## 2024-01-31 NOTE — Progress Notes (Signed)
 Musculoskeletal Exam  Patient: Carla Chandler DOB: 1926/04/03  DOS: 01/31/2024  SUBJECTIVE:  Chief Complaint:   Chief Complaint  Patient presents with   Back Pain    Patient presents today for low back pain for a few weeks now. Taking tylenol and ibuprofen.    KEASIA Chandler is a 89 y.o.  female for evaluation and treatment of back pain. Here w her daughter.   Onset:  2 weeks ago. No inj or change in activity.  Location: R lower Character:  aching  Progression of issue:  is unchanged Associated symptoms: radiates around the front on R side Denies bowel/bladder incontinence, bruising, redness, swelling, or weakness Treatment: to date has been OTC NSAIDS, acetaminophen, and lidocaine patches; all helpful but not improving issue.   Neurovascular symptoms: no  Over the past 2 months, she has noticed a lump on the right side of her neck.  It is not tender unless she pushes on it.  No trauma to the area.  She has a history of lymphoma.  She told her regular PCP that even if it does come back, she would not pursue treatment.  No recent illness or fevers.  Past Medical History:  Diagnosis Date   Accelerated hypertension 12/04/2015   AF (paroxysmal atrial fibrillation) (HCC) 06/12/2020   Anxiety    Bilateral impacted cerumen 02/12/2018   Breast cancer (HCC)    right   Carotid artery stenosis    CN VI palsy, left 03/13/2019   Cyst of joint of shoulder 12/20/2017   Dizziness 11/12/2020   DLBCL (diffuse large B cell lymphoma) (HCC) 02/29/2016   Drusen of macula of both eyes 02/29/2016   Dry eyes, bilateral 02/29/2016   Dyslipidemia, goal LDL below 130 10/26/2016   Dysphagia 05/25/2015   Dyspnea 05/25/2015   Essential hypertension 05/27/2015   Excessive cerumen in ear canal, bilateral 04/26/2017   Frequent PAC (premature atrial contraction) 01/09/2021   Gastroesophageal reflux disease 06/12/2020   Glaucoma 06/12/2020   History of B-cell lymphoma 10/26/2016   History of  breast cancer 06/12/2020   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 2013 rt radiation with balloon   Hyperlipidemia    Hypertension    Hyponatremia 05/25/2015   Formatting of this note might be different from the original. Mild and likely 2ry to diuretic use   Idiopathic peripheral neuropathy 10/26/2016   Insomnia 06/12/2020   Knee effusion, right 01/22/2016   Laryngeal edema 05/25/2015   Lymphoma (HCC) 05/05/2015   Microcalcification of left breast on mammogram 09/27/2017   Murmur, cardiac 07/11/2020   Osteopenia of left thigh 10/26/2016   Other visual disturbances 12/04/2015   PAF (paroxysmal atrial fibrillation) (HCC) 07/11/2020   PAT (paroxysmal atrial tachycardia) (HCC) 01/09/2021   Poor balance 06/12/2020   Primary hypertension 11/12/2020   Primary open angle glaucoma of left eye, moderate stage 02/29/2016   Primary osteoarthritis of both knees 01/22/2016   Primary osteoarthritis of both shoulders 04/12/2017   Primary osteoarthritis of left knee 01/11/2017   Pseudophakia of both eyes 02/29/2016   PVD (posterior vitreous detachment), both eyes 02/29/2016   Radiation injury 05/27/2015   Right rotator cuff tear arthropathy 01/03/2018   Sensorineural hearing loss (SNHL) of both ears 04/26/2017   Shortness of breath 02/13/2021   Varicose veins of left lower extremity 06/11/2020    Objective:  VITAL SIGNS: BP 138/82 (BP Location: Left Arm, Cuff Size: Normal)  Pulse 60   Resp 14   Ht 5\' 4"  (1.626 m)   Wt 167 lb 3.2 oz (75.8 kg)   SpO2 98%   BMI 28.70 kg/m  Constitutional: Well formed, well developed. No acute distress. HENT: Normocephalic, atraumatic.  Thorax & Lungs:  No accessory muscle use Musculoskeletal: low back.   Tenderness to palpation: Yes over the thoracolumbar paraspinal musculature on the right with associated hypertonicity Deformity: no Ecchymosis:  no Straight leg test: negative for Poor hamstring flexibility b/l. Skin: Over supraclav region on R, there is a raised and flesh colored lesion approx 2 x 3 cm in diameter. TTP. Freely moveable. No fluctuance, erythema, edema, drainage.  Neurologic: Normal sensory function. No focal deficits noted. DTR's 0/4 and symmetric in LE's. No clonus. Gait is cautious.  Psychiatric: Normal mood. Age appropriate judgment and insight. Alert & oriented x 3.    Assessment:  Acute right-sided low back pain without sciatica  Skin mass  Plan: 1. Stretches/exercises, heat, ice, Tylenol. Avoid muscle relaxers 2/2 possible sedation. PT if no better or cannot do stretches.  2. Could be LN vs cyst. Pt states if her lymphoma recurred, she would not want to pursue tx. Given these, we agreed to forego further workup. She will let me know if she changes her mind.  F/u prn. The patient and her daughter voiced understanding and agreement to the plan.  I spent 32 min w the pt and her daughter discussing the above plans in addition to reviewing her chart on the same day of the visit.   Jilda Roche Sandborn, DO 01/31/24  2:52 PM

## 2024-02-15 ENCOUNTER — Other Ambulatory Visit: Payer: Self-pay | Admitting: Family

## 2024-02-15 NOTE — Telephone Encounter (Signed)
 Requesting: lorazepam  Contract: 06/17/20 UDS:  11/06/21 Last Visit: 12/13/23 Next Visit: None Last Refill:  Unable to see when last refilled  Please Advise

## 2024-02-17 ENCOUNTER — Encounter: Payer: Self-pay | Admitting: Family

## 2024-03-15 ENCOUNTER — Other Ambulatory Visit: Payer: Self-pay | Admitting: Family

## 2024-03-18 ENCOUNTER — Other Ambulatory Visit: Payer: Self-pay | Admitting: Family

## 2024-05-15 ENCOUNTER — Other Ambulatory Visit: Payer: Self-pay | Admitting: *Deleted

## 2024-05-15 ENCOUNTER — Encounter: Payer: Self-pay | Admitting: Family

## 2024-05-15 MED ORDER — MELOXICAM 7.5 MG PO TABS: 7.5000 mg | ORAL_TABLET | Freq: Every day | ORAL | 0 refills | Status: DC

## 2024-05-16 ENCOUNTER — Other Ambulatory Visit: Payer: Self-pay | Admitting: Family

## 2024-06-06 DIAGNOSIS — H401122 Primary open-angle glaucoma, left eye, moderate stage: Secondary | ICD-10-CM | POA: Diagnosis not present

## 2024-06-06 DIAGNOSIS — H43813 Vitreous degeneration, bilateral: Secondary | ICD-10-CM | POA: Diagnosis not present

## 2024-06-06 DIAGNOSIS — H401111 Primary open-angle glaucoma, right eye, mild stage: Secondary | ICD-10-CM | POA: Diagnosis not present

## 2024-06-06 DIAGNOSIS — H4922 Sixth [abducent] nerve palsy, left eye: Secondary | ICD-10-CM | POA: Diagnosis not present

## 2024-06-06 DIAGNOSIS — Z961 Presence of intraocular lens: Secondary | ICD-10-CM | POA: Diagnosis not present

## 2024-06-06 DIAGNOSIS — H532 Diplopia: Secondary | ICD-10-CM | POA: Diagnosis not present

## 2024-06-06 DIAGNOSIS — H35363 Drusen (degenerative) of macula, bilateral: Secondary | ICD-10-CM | POA: Diagnosis not present

## 2024-06-06 DIAGNOSIS — H04123 Dry eye syndrome of bilateral lacrimal glands: Secondary | ICD-10-CM | POA: Diagnosis not present

## 2024-06-11 DIAGNOSIS — L821 Other seborrheic keratosis: Secondary | ICD-10-CM | POA: Diagnosis not present

## 2024-06-11 DIAGNOSIS — L400 Psoriasis vulgaris: Secondary | ICD-10-CM | POA: Diagnosis not present

## 2024-06-27 ENCOUNTER — Other Ambulatory Visit: Payer: Self-pay | Admitting: Family

## 2024-07-12 ENCOUNTER — Other Ambulatory Visit: Payer: Self-pay | Admitting: Family

## 2024-08-15 ENCOUNTER — Other Ambulatory Visit: Payer: Self-pay | Admitting: Family

## 2024-09-17 ENCOUNTER — Encounter: Payer: Self-pay | Admitting: Family

## 2024-09-18 MED ORDER — ONDANSETRON HCL 4 MG PO TABS: 4.0000 mg | ORAL_TABLET | Freq: Three times a day (TID) | ORAL | 0 refills | Status: DC | PRN

## 2024-09-25 ENCOUNTER — Other Ambulatory Visit (HOSPITAL_COMMUNITY): Payer: Self-pay

## 2024-09-25 ENCOUNTER — Telehealth: Payer: Self-pay

## 2024-09-25 NOTE — Telephone Encounter (Signed)
 Pharmacy Patient Advocate Encounter   Received notification from Pt Calls Messages that prior authorization for Ondansetron 4mg  tabs is required/requested.   Insurance verification completed.   The patient is insured through [facility] ADVANTAGE/RX ADVANCE.   Per test claim: PA required; PA submitted to above mentioned insurance via Latent Key/confirmation #/EOC [facility] Status is pending   Fax# [phone] Phone# [phone]

## 2024-09-25 NOTE — Telephone Encounter (Signed)
 Can you see if PA is needed for ondansetron please?

## 2024-09-26 ENCOUNTER — Telehealth: Payer: Self-pay

## 2024-09-26 NOTE — Telephone Encounter (Signed)
 Copied from CRM [id number]. Topic: Clinical - Prescription Issue >> Sep 26, 2024  1:44 PM [name] wrote: Reason for CRM: Daughter Avelina is calling in stating she never heard back about the insurance not paying for the nausea medication. I see in the chart that it looks like we are working on a prior auth for this through her insurance, but daughter states that patient is sick to her stomach and wants to know if there is anything we can do for her to help her with this?  States she is willing to pay full price out of pocket if this is what is needed. She can also do the pharmacy in the med center if needed so she can run it to her if needed.  Please call daughter, Avelina, back at [phone]

## 2024-09-26 NOTE — Telephone Encounter (Signed)
 Pharmacy Patient Advocate Encounter  Received notification from [facility] ADVANTAGE/RX ADVANCE that Prior Authorization for Ondansetron 4mg  tabs has been DENIED.  Full denial letter will be uploaded to the media tab. See denial reason below.   PA #/Case ID/Reference #: [id number]

## 2024-09-26 NOTE — Telephone Encounter (Signed)
 Called Carla Chandler twice but no answer. Lvm for her to be aware PA was denied. For cash price she can contact the pharmacy. Per provider, she can also use the [employer] and it will cost about 15 to 16 dollars at [facility]

## 2024-09-27 NOTE — Telephone Encounter (Signed)
 Spoke with daughter and she stated that they used [facility] and picked up medication.

## 2024-10-02 ENCOUNTER — Other Ambulatory Visit: Payer: Self-pay | Admitting: Family

## 2024-10-12 ENCOUNTER — Other Ambulatory Visit: Payer: Self-pay | Admitting: Family

## 2024-10-14 ENCOUNTER — Other Ambulatory Visit: Payer: Self-pay | Admitting: Family

## 2024-10-14 ENCOUNTER — Other Ambulatory Visit: Payer: Self-pay | Admitting: Cardiology

## 2024-10-24 ENCOUNTER — Ambulatory Visit: Payer: Self-pay

## 2024-10-24 ENCOUNTER — Other Ambulatory Visit: Payer: Self-pay

## 2024-10-24 ENCOUNTER — Ambulatory Visit: Admitting: Family

## 2024-10-24 VITALS — BP 163/71 | HR 82 | Temp 97.6°F | Resp 16 | Ht 64.0 in | Wt 168.0 lb

## 2024-10-24 DIAGNOSIS — M8580 Other specified disorders of bone density and structure, unspecified site: Secondary | ICD-10-CM

## 2024-10-24 DIAGNOSIS — E782 Mixed hyperlipidemia: Secondary | ICD-10-CM

## 2024-10-24 DIAGNOSIS — R739 Hyperglycemia, unspecified: Secondary | ICD-10-CM | POA: Diagnosis not present

## 2024-10-24 DIAGNOSIS — Z8572 Personal history of non-Hodgkin lymphomas: Secondary | ICD-10-CM | POA: Diagnosis not present

## 2024-10-24 DIAGNOSIS — R42 Dizziness and giddiness: Secondary | ICD-10-CM

## 2024-10-24 DIAGNOSIS — R300 Dysuria: Secondary | ICD-10-CM | POA: Diagnosis not present

## 2024-10-24 DIAGNOSIS — R110 Nausea: Secondary | ICD-10-CM | POA: Diagnosis not present

## 2024-10-24 DIAGNOSIS — D7589 Other specified diseases of blood and blood-forming organs: Secondary | ICD-10-CM

## 2024-10-24 DIAGNOSIS — I10 Essential (primary) hypertension: Secondary | ICD-10-CM | POA: Diagnosis not present

## 2024-10-24 DIAGNOSIS — G4700 Insomnia, unspecified: Secondary | ICD-10-CM

## 2024-10-24 DIAGNOSIS — Z853 Personal history of malignant neoplasm of breast: Secondary | ICD-10-CM

## 2024-10-24 LAB — POC URINALSYSI DIPSTICK (AUTOMATED)
Bilirubin, UA: NEGATIVE
Blood, UA: NEGATIVE
Glucose, UA: NEGATIVE
Nitrite, UA: NEGATIVE
Protein, UA: POSITIVE — AB
Spec Grav, UA: 1.02 (ref 1.010–1.025)
Urobilinogen, UA: 0.2 U/dL
pH, UA: 5 (ref 5.0–8.0)

## 2024-10-24 MED ORDER — VERAPAMIL HCL ER 180 MG PO TBCR: 180.0000 mg | EXTENDED_RELEASE_TABLET | Freq: Every day | ORAL | 2 refills | Status: AC

## 2024-10-24 MED ORDER — ONDANSETRON HCL 4 MG PO TABS: 4.0000 mg | ORAL_TABLET | Freq: Three times a day (TID) | ORAL | 0 refills | Status: DC | PRN

## 2024-10-24 NOTE — Assessment & Plan Note (Addendum)
 BP Readings from Last 3 Encounters:  10/24/24 (!) 163/71  01/31/24 138/82  10/07/23 126/84    BP elevated today. On clonidine  once daily and verapamil . - Check BP once daily for a week at home, report via MyChart. - Continue current antihypertensive regimen for now. Consider adjusting if trend is high.

## 2024-10-24 NOTE — Progress Notes (Signed)
 Subjective:     Patient ID: Carla Chandler, female    DOB: 03-Jun-1926, 89 y.o.   MRN: [id number]  Chief Complaint  Patient presents with   Hypertension    Here for follow up   Dysuria    Patient reports having burning with urination   Numbness    Patient complains of numbness of right hand   Dizziness    Patient reports having occasional dizziness   Insomnia    HPI  Discussed the use of AI scribe software for clinical note transcription with the patient, who gave verbal consent to proceed.  History of Present Illness Carla Chandler is a 89 year old female who presents with urinary symptoms and hand numbness. She is accompanied by her daughter, Carla Chandler.  She has been experiencing burning during urination for the past couple of weeks. Her urinary incontinence has worsened over the past three to six months. She wears many pads and is uncertain if this contributes to her symptoms. A prior operation was mentioned for urinary issues, but details were not specified.  She describes numbness in her hand, which sometimes extends to her arm. Her hands are often ice cold, and the numbness affects all fingers, making them difficult to move. The numbness resolves after moving around. She experiences this numbness both at night and during the day, particularly when sitting in her chair. She has a history of two painful shoulders, especially at night.  She reports frequent dizziness, including when getting up at night, and uses a walker for stability. She reports that a CT of her head and an ultrasound of her carotids were performed in January. She experiences double vision when looking sideways, which has been present for the last year or two.  She experiences nausea, for which she takes Zofran  approximately once every couple of days, usually around lunchtime or after eating. She also takes famotidine  for acid reflux. Her diet includes cereal, banana, and toast for breakfast, and larger  meals do not seem to exacerbate her symptoms.  She has difficulty sleeping and has tried melatonin without success. She previously used lorazepam  but discontinued it due to dizziness. She naps during the day, which may affect her nighttime sleep.  She monitors her blood pressure at home and notes discrepancies between home and office readings. She takes clonidine  once daily and verapamil . She also takes an 81 mg aspirin  daily. She has a history of PVCs, described as 'skippies', but does not consume caffeine.      Health Maintenance Due  Topic Date Due   DTaP/Tdap/Td (1 - Tdap) Never done   Zoster Vaccines- Shingrix (1 of 2) Never done   Mammogram  Never done   COVID-19 Vaccine (5 - 2025-26 season) 07/23/2024   Medicare Annual Wellness (AWV)  10/12/2024    Past Medical History:  Diagnosis Date   Accelerated hypertension 12/04/2015   AF (paroxysmal atrial fibrillation) (HCC) 06/12/2020   Anxiety    Bilateral impacted cerumen 02/12/2018   Breast cancer (HCC)    right   Carotid artery stenosis    CN VI palsy, left 03/13/2019   Cyst of joint of shoulder 12/20/2017   Dizziness 11/12/2020   DLBCL (diffuse large B cell lymphoma) (HCC) 02/29/2016   Drusen of macula of both eyes 02/29/2016   Dry eyes, bilateral 02/29/2016   Dyslipidemia, goal LDL below 130 10/26/2016   Dysphagia 05/25/2015   Dyspnea 05/25/2015   Essential hypertension 05/27/2015   Excessive cerumen in ear canal, bilateral 04/26/2017  Frequent PAC (premature atrial contraction) 01/09/2021   Gastroesophageal reflux disease 06/12/2020   Glaucoma 06/12/2020   History of B-cell lymphoma 05/05/2015   History of breast cancer 06/12/2020   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 2013 rt radiation with balloon   Hyperlipidemia    Hypertension    Hyponatremia 05/25/2015   Formatting of this note might be different  from the original. Mild and likely 2ry to diuretic use   Idiopathic peripheral neuropathy 10/26/2016   Insomnia 06/12/2020   Knee effusion, right 01/22/2016   Laryngeal edema 05/25/2015   Lymphoma (HCC) 05/05/2015   Microcalcification of left breast on mammogram 09/27/2017   Murmur, cardiac 07/11/2020   Osteopenia of left thigh 10/26/2016   Other visual disturbances 12/04/2015   PAF (paroxysmal atrial fibrillation) (HCC) 07/11/2020   PAT (paroxysmal atrial tachycardia) 01/09/2021   Poor balance 06/12/2020   Primary hypertension 11/12/2020   Primary open angle glaucoma of left eye, moderate stage 02/29/2016   Primary osteoarthritis of both knees 01/22/2016   Primary osteoarthritis of both shoulders 04/12/2017   Primary osteoarthritis of left knee 01/11/2017   Pseudophakia of both eyes 02/29/2016   PVD (posterior vitreous detachment), both eyes 02/29/2016   Radiation injury 05/27/2015   Right rotator cuff tear arthropathy 01/03/2018   Sensorineural hearing loss (SNHL) of both ears 04/26/2017   Shortness of breath 02/13/2021   Varicose veins of left lower extremity 06/11/2020    Past Surgical History:  Procedure Laterality Date   BLADDER SUSPENSION     BREAST SURGERY Right 2008   lumpectomy   LAPAROSCOPIC HYSTERECTOMY     LYMPHADENECTOMY Right 2016    Family History  Problem Relation Age of Onset   Heart attack Father    Lymphoma Sister    Aneurysm Brother    Cancer Sister    Stroke Sister     Social History   Socioeconomic History   Marital status: Widowed    Spouse name: Not on file   Number of children: Not on file   Years of education: Not on file   Highest education level: Not on file  Occupational History   Occupation: Retired  Tobacco Use   Smoking status: Former   Smokeless tobacco: Never  Vaping Use   Vaping status: Never Used  Substance and Sexual Activity   Alcohol use: Yes    Alcohol/week: 3.0 standard drinks of alcohol    Types: 3 Glasses of  wine per week    Comment: wine 3 x per week   Drug use: Never   Sexual activity: Not Currently  Other Topics Concern   Not on file  Social History Narrative   Grew up in [place], moved to [place] in her 20's   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   Husband died when she was in her 102's   Son and 2 daughters- all local   Has 5 grandchildren and 3 great grandchildren   Lives by herself   No pets   Used to enjoy golf   Enjoys golf   Social Drivers of Corporate Investment Banker Strain: Low Risk  (10/13/2023)   Overall Financial Resource Strain (CARDIA)    Difficulty of Paying Living Expenses: Not hard at all  Food Insecurity: No Food Insecurity (10/13/2023)   Hunger Vital Sign    Worried About Running Out of Food in the Last  Year: Never true    Ran Out of Food in the Last Year: Never true  Transportation Needs: No Transportation Needs (10/13/2023)   PRAPARE - Administrator, Civil Service (Medical): No    Lack of Transportation (Non-Medical): No  Physical Activity: Inactive (10/13/2023)   Exercise Vital Sign    Days of Exercise per Week: 0 days    Minutes of Exercise per Session: 0 min  Stress: No Stress Concern Present (10/13/2023)   [employer] of Occupational Health - Occupational Stress Questionnaire    Feeling of Stress : Not at all  Social Connections: Moderately Integrated (10/13/2023)   Social Connection and Isolation Panel    Frequency of Communication with Friends and Family: More than three times a week    Frequency of Social Gatherings with Friends and Family: More than three times a week    Attends Religious Services: More than 4 times per year    Active Member of [employer] or Organizations: Yes    Attends Banker Meetings: More than 4 times per year    Marital Status: Widowed  Intimate Partner Violence: Not At Risk (10/13/2023)   Humiliation, Afraid, Rape, and Kick questionnaire    Fear of Current or  Ex-Partner: No    Emotionally Abused: No    Physically Abused: No    Sexually Abused: No    Outpatient Medications Prior to Visit  Medication Sig Dispense Refill   aspirin  EC 81 MG tablet Take 1 tablet (81 mg total) by mouth daily. Swallow whole. 30 tablet 12   b complex vitamins capsule Take 1 capsule by mouth daily.     citalopram  (CELEXA ) 20 MG tablet Take 1 tablet (20 mg total) by mouth daily. 90 tablet 0   cloNIDine  (CATAPRES ) 0.1 MG tablet Take 1 tablet by mouth twice daily 180 tablet 0   famotidine  (PEPCID ) 20 MG tablet Take 1 tablet by mouth twice daily 180 tablet 0   meloxicam  (MOBIC ) 7.5 MG tablet Take 1 tablet by mouth once daily 90 tablet 0   OVER THE COUNTER MEDICATION Once PRN. Preservative Free Moisture Eye drops.     ROCKLATAN 0.02-0.005 % SOLN Apply to eye.     sulfamethoxazole -trimethoprim  (BACTRIM  DS) 800-160 MG tablet Take 1 tablet by mouth 2 (two) times daily. 10 tablet 0   LORazepam  (ATIVAN ) 1 MG tablet TAKE 1 TABLET BY MOUTH AT BEDTIME 30 tablet 0   ondansetron  (ZOFRAN ) 4 MG tablet Take 1 tablet (4 mg total) by mouth every 8 (eight) hours as needed for nausea or vomiting. 30 tablet 0   verapamil  (CALAN -SR) 180 MG CR tablet TAKE 1 TABLET BY MOUTH AT BEDTIME 90 tablet 2   No facility-administered medications prior to visit.    Allergies  Allergen Reactions   Bimatoprost Other (See Comments)    toxicity   Levofloxacin Diarrhea   Penicillins Itching   Shellfish Allergy Other (See Comments)   Statins Other (See Comments)    Myalgias - all statins  Per pt   Atorvastatin Other (See Comments)   Brimonidine Other (See Comments)   Dorzolamide Hcl-Timolol Mal Other (See Comments)   Lovastatin Other (See Comments)   Perindopril Other (See Comments)    ROS    See HPI Objective:    Physical Exam Constitutional:      General: She is not in acute distress.    Appearance: Normal appearance. She is well-developed.  HENT:     Head: Normocephalic and atraumatic.  Right Ear: External ear normal.     Left Ear: External ear normal.  Eyes:     General: No scleral icterus. Neck:     Thyroid : No thyromegaly.  Cardiovascular:     Rate and Rhythm: Normal rate and regular rhythm.     Heart sounds: Normal heart sounds. No murmur heard. Pulmonary:     Effort: Pulmonary effort is normal. No respiratory distress.     Breath sounds: Normal breath sounds. No wheezing.  Musculoskeletal:     Cervical back: Neck supple.  Skin:    General: Skin is warm and dry.  Neurological:     Mental Status: She is alert and oriented to person, place, and time.  Psychiatric:        Mood and Affect: Mood normal.        Behavior: Behavior normal.        Thought Content: Thought content normal.        Judgment: Judgment normal.      BP (!) 163/71   Pulse 82   Temp 97.6 F (36.4 C) (Oral)   Resp 16   Ht 5' 4 (1.626 m)   Wt 168 lb (76.2 kg)   SpO2 95%   BMI 28.84 kg/m  Wt Readings from Last 3 Encounters:  10/24/24 168 lb (76.2 kg)  01/31/24 167 lb 3.2 oz (75.8 kg)  10/13/23 166 lb (75.3 kg)       Assessment & Plan:   Problem List Items Addressed This Visit       Unprioritized   Osteopenia   Declines further dexa scans.      Nausea    Intermittent postprandial nausea, Zofran  effective. - Continue Zofran  as needed. - Refilled Zofran  prescription.      Relevant Medications   ondansetron  (ZOFRAN ) 4 MG tablet   Insomnia    Chronic insomnia, previous lorazepam  discontinued. - sleeping a lot during the day which is likely contributing. Recommended that she limit daytime naps if possible. - Try melatonin starting at 1 mg at night, increase as needed.      Hyperlipidemia   Lab Results  Component Value Date   CHOL 224 (H) 12/13/2022   HDL 44.90 12/13/2022   LDLCALC 148 (H) 12/13/2022   TRIG 159.0 (H) 12/13/2022   CHOLHDL 5 12/13/2022          History of breast cancer   In remission.  Would not retreat if cancer returns.       History of B-cell lymphoma   In remission.  Would not retreat if cancer returns.      Essential hypertension - Primary   BP Readings from Last 3 Encounters:  10/24/24 (!) 163/71  01/31/24 138/82  10/07/23 126/84    BP elevated today. On clonidine  once daily and verapamil . - Check BP once daily for a week at home, report via MyChart. - Continue current antihypertensive regimen for now. Consider adjusting if trend is high.       Dysuria   Dysuria, frequency, and worsening incontinence Trace WBCs in urine, culture pending. Possible UTI. - Sent urine sample for culture. - Start antibiotics if culture grows bacteria. - Advised to report if symptoms worsen.      Relevant Orders   Urine Culture   POCT Urinalysis Dipstick (Automated) (Completed)   Dizziness    Chronic dizziness possibly due to cerebral artery calcifications/carotid stenosis. Declines further testing due to advanced age. - Continue using walker for stability. - Consider neurology referral if she opts  for further evaluation.      Other Visit Diagnoses       Macrocytosis       Relevant Orders   B12     Hyperglycemia       Relevant Orders   Comp Met (CMET)   HgB A1c      Assessment & Plan  I personally spent a total of 45 minutes in the care of the patient today including preparing to see the patient, performing a medically appropriate exam/evaluation, counseling and educating, placing orders, and documenting clinical information in the EHR.    I have discontinued Keene HERO. Scialdone's LORazepam . I am also having her maintain her b complex vitamins, Rocklatan, OVER THE COUNTER MEDICATION, aspirin  EC, sulfamethoxazole -trimethoprim , meloxicam , famotidine , cloNIDine , citalopram , and ondansetron .  Meds ordered this encounter  Medications   ondansetron  (ZOFRAN ) 4 MG tablet    Sig: Take 1 tablet (4 mg total) by mouth every 8 (eight) hours as needed for nausea or vomiting.    Dispense:  30 tablet    Refill:   0    Supervising Provider:   DOMENICA BLACKBIRD A [[id number]]

## 2024-10-24 NOTE — Assessment & Plan Note (Signed)
 Declines further dexa scans.

## 2024-10-24 NOTE — Assessment & Plan Note (Signed)
 In remission.  Would not retreat if cancer returns.

## 2024-10-24 NOTE — Assessment & Plan Note (Signed)
 Lab Results  Component Value Date   CHOL 224 (H) 12/13/2022   HDL 44.90 12/13/2022   LDLCALC 148 (H) 12/13/2022   TRIG 159.0 (H) 12/13/2022   CHOLHDL 5 12/13/2022

## 2024-10-24 NOTE — Telephone Encounter (Signed)
 FYI Only or Action Required?: FYI only for provider: appointment scheduled on 10/24/2024.  Patient was last seen in primary care on 01/31/2024 by Frann Mabel Mt, DO.  Called Nurse Triage reporting Dysuria.  Symptoms began a week ago.  Interventions attempted: Nothing.  Symptoms are: unchanged.  Triage Disposition: See Physician Within 24 Hours  Patient/caregiver understands and will follow disposition?: Yes  Copied from CRM #[id number]. Topic: Clinical - Red Word Triage >> Oct 24, 2024 10:45 AM Mesmerise C wrote: [facility] that prompted transfer to Nurse Triage: Patient stated she been having burning believes may have yeast infection been ongoing a week, also been experiencing light headedness Reason for Disposition  Age > 50 years  Answer Assessment - Initial Assessment Questions Patient has appointment this afternoon and was told to call in to tell symptoms. Patient complains of pain in both shoulders and intermittent numbness in arms. Patient states she can not sleep at night and would like something to help with this that will not knock her out the next day.   1. SEVERITY: How bad is the pain?  (e.g., Scale 1-10; mild, moderate, or severe)     6 2. FREQUENCY: How many times have you had painful urination today?      Denies 3. PATTERN: Is pain present every time you urinate or just sometimes?      Pain is present when urinating and for a little bit after.  4. ONSET: When did the painful urination start?      1 week ago 5. FEVER: Do you have a fever? If Yes, ask: What is your temperature, how was it measured, and when did it start?     Denies 6. PAST UTI: Have you had a urine infection before? If Yes, ask: When was the last time? and What happened that time?      UTI 8. OTHER SYMPTOMS: Do you have any other symptoms? (e.g., blood in urine, flank pain, genital sores, urgency, vaginal discharge)     denies  Protocols used: Urination Pain -  Female-A-AH

## 2024-10-24 NOTE — Assessment & Plan Note (Signed)
  Chronic insomnia, previous lorazepam  discontinued. - sleeping a lot during the day which is likely contributing. Recommended that she limit daytime naps if possible. - Try melatonin starting at 1 mg at night, increase as needed.

## 2024-10-24 NOTE — Patient Instructions (Signed)
  VISIT SUMMARY: Today, we discussed your urinary symptoms, hand numbness, dizziness, blood pressure, nausea, sleep issues, and chronic pain. We reviewed your current medications and made some adjustments to better manage your symptoms.  YOUR PLAN: -URINARY SYMPTOMS: You have been experiencing burning during urination and worsening incontinence. We sent a urine sample for culture to check for infection. If bacteria are found, we will start you on antibiotics. Please let us  know if your symptoms get worse.  -DIZZINESS AND BALANCE IMPAIRMENT: You have been feeling dizzy, especially when getting up at night. This may be due to calcification in your brain's arteries. Continue using your walker for stability. If you decide you want further evaluation, we can refer you to a neurologist.  -ESSENTIAL HYPERTENSION: Your blood pressure readings at home are different from those in the office. Continue taking your current blood pressure medications, clonidine  and verapamil . Check your blood pressure once daily for a week and report the readings through MyChart.  -NAUSEA: You have been experiencing nausea after eating, and Zofran  has been helping. Continue taking Zofran  as needed. We have refilled your prescription.  -SLEEP DISTURBANCE/INSOMNIA: You have difficulty sleeping. Try taking melatonin starting at 1 mg at night and increase the dose as needed. Try to avoid daytime naps.  -SHOULDER PAIN, BILATERAL: You have chronic shoulder pain, especially at night. Continue with your current management and monitor your symptoms.  -KNEE PAIN, BILATERAL: You have chronic knee pain. Continue with your current management and monitor your symptoms.  -HYPERGLYCEMIA: Your blood sugar levels are slightly elevated. We have ordered an A1c test to check your diabetes control.  INSTRUCTIONS: Please follow up with us  if your urinary symptoms worsen or if you decide you want further evaluation for your dizziness. Check your  blood pressure once daily for a week and report the readings through MyChart. Continue taking your medications as prescribed. We will contact you with the results of your urine culture and A1c test.

## 2024-10-24 NOTE — Assessment & Plan Note (Signed)
  Intermittent postprandial nausea, Zofran  effective. - Continue Zofran  as needed. - Refilled Zofran  prescription.

## 2024-10-24 NOTE — Assessment & Plan Note (Signed)
 Dysuria, frequency, and worsening incontinence Trace WBCs in urine, culture pending. Possible UTI. - Sent urine sample for culture. - Start antibiotics if culture grows bacteria. - Advised to report if symptoms worsen.

## 2024-10-24 NOTE — Assessment & Plan Note (Signed)
  Chronic dizziness possibly due to cerebral artery calcifications/carotid stenosis. Declines further testing due to advanced age. - Continue using walker for stability. - Consider neurology referral if she opts for further evaluation.

## 2024-10-25 ENCOUNTER — Telehealth: Payer: Self-pay

## 2024-10-25 LAB — COMPREHENSIVE METABOLIC PANEL WITH GFR
ALT: 31 U/L (ref 0–35)
AST: 19 U/L (ref 0–37)
Albumin: 4 g/dL (ref 3.5–5.2)
Alkaline Phosphatase: 51 U/L (ref 39–117)
BUN: 29 mg/dL — ABNORMAL HIGH (ref 6–23)
CO2: 31 meq/L (ref 19–32)
Calcium: 9.4 mg/dL (ref 8.4–10.5)
Chloride: 99 meq/L (ref 96–112)
Creatinine, Ser: 0.62 mg/dL (ref 0.40–1.20)
GFR: 74.92 mL/min (ref >60.00)
Glucose, Bld: 182 mg/dL — ABNORMAL HIGH (ref 70–99)
Potassium: 4.5 meq/L (ref 3.5–5.1)
Sodium: 138 meq/L (ref 135–145)
Total Bilirubin: 0.4 mg/dL (ref 0.2–1.2)
Total Protein: 6.4 g/dL (ref 6.0–8.3)

## 2024-10-25 LAB — VITAMIN B12: Vitamin B-12: >1500 pg/mL — ABNORMAL HIGH (ref 211–911)

## 2024-10-25 LAB — HEMOGLOBIN A1C: Hgb A1c MFr Bld: 6.3 % (ref 4.6–6.5)

## 2024-10-25 NOTE — Telephone Encounter (Signed)
 Spoke to patient about this yesterday, per patient, she is not needing anything else at this time

## 2024-10-25 NOTE — Telephone Encounter (Signed)
 Copied from CRM #[id number]. Topic: Clinical - Medication Question >> Oct 24, 2024  4:59 PM Dedra B wrote: Reason for CRM: Pt would like a call from [facility] regarding verapamil  (CALAN -SR) 180 MG CR tablet.

## 2024-10-26 ENCOUNTER — Ambulatory Visit: Payer: Self-pay | Admitting: Family

## 2024-10-26 DIAGNOSIS — N3000 Acute cystitis without hematuria: Secondary | ICD-10-CM

## 2024-10-26 MED ORDER — SULFAMETHOXAZOLE-TRIMETHOPRIM 800-160 MG PO TABS: 1.0000 | ORAL_TABLET | Freq: Two times a day (BID) | ORAL | 0 refills | Status: AC

## 2024-10-26 NOTE — Telephone Encounter (Signed)
 Urine culture does show mild UTI, please begin Bactrim  bid x 3 days. Let me know if symptoms worsen or if symptoms do not improve.

## 2024-10-27 LAB — URINE CULTURE
MICRO NUMBER:: 17307411
SPECIMEN QUALITY:: ADEQUATE

## 2024-10-29 ENCOUNTER — Telehealth: Payer: Self-pay

## 2024-10-29 NOTE — Telephone Encounter (Signed)
 Called pharmacy to confirm, patient notified rx ready for for pick up

## 2024-10-29 NOTE — Telephone Encounter (Signed)
 Chart reviewed, rx was sent for tablets not capsules?

## 2024-10-29 NOTE — Telephone Encounter (Signed)
 Copied from CRM [id number]. Topic: Clinical - Prescription Issue >> Oct 26, 2024  3:41 PM Thersia C wrote: Reason for CRM: Patient daughter Florence in stated that they spoke to the insurance and the capsule is 100 dollars while the tablet is only 14 dollars , so would like for a capsule instead of tablet needs it to be redo as a tablet instead for   verapamil  (CALAN -SR) 180 MG CR tablet

## 2024-11-16 ENCOUNTER — Other Ambulatory Visit: Payer: Self-pay | Admitting: Family

## 2024-12-01 ENCOUNTER — Encounter: Payer: Self-pay | Admitting: Family

## 2024-12-03 ENCOUNTER — Telehealth: Payer: Self-pay

## 2024-12-03 ENCOUNTER — Ambulatory Visit: Admitting: Family Medicine

## 2024-12-03 NOTE — Telephone Encounter (Signed)
 Chief Complaint BREATHING - shortness of breath or sounds breathless Reason for Call Symptomatic / Request for Health Information Initial Comment Caller states her mother is very dizzy and having trouble moving around. She has swollen legs and has fluid in her lungs. HAs some sob when laying down Translation No Nurse Assessment Nurse: Glenis, RN, Katelyn Date/Time (Eastern Time): 12/01/2024 3:21:33 PM Confirm and document reason for call. If symptomatic, describe symptoms. ---Caller states her mother is experiencing dizziness/ lightheaded diff. time getting up due to afraid of falling. Nephew is MD he listened to lungs and noted fluid in lungs, swelling legs knee down to feet- SOB when lying down Does the patient have any new or worsening symptoms? ---Yes Will a triage be completed? ---Yes Related visit to physician within the last 2 weeks? ---No Does the PT have any chronic conditions? (i.e. diabetes, asthma, this includes High risk factors for pregnancy, etc.) ---Yes List chronic conditions. ---irregular heart beat HTN Is this a behavioral health or substance abuse call? ---No Guidelines Guideline Title Affirmed Question Affirmed Notes Nurse Date/Time (Eastern Time) Breathing Difficulty [1] MODERATE difficulty breathing (e.g., speaks in phrases, SOB even at Horatio, RN, Katelyn 12/01/2024 3:25:39 PM PLEASE NOTE: All timestamps contained within this report are represented as Eastern Standard Time. CONFIDENTIALTY NOTICE: This fax transmission is intended only for the addressee. It contains information that is legally privileged, confidential or otherwise protected from use or disclosure. If you are not the intended recipient, you are strictly prohibited from reviewing, disclosing, copying using or disseminating any of this information or taking any action in reliance on or regarding this information. If you have received this fax in error, please notify us  immediately by  telephone so that we can arrange for its return to us . Phone: [phone], Toll-Free: [phone], Fax: [phone] EILEEN_RICHARDSON 06-05-1926 Page: 1 of2 CallId: [id number] Guidelines Guideline Title Affirmed Question Affirmed Notes Nurse Date/Time Titus Time) rest, pulse 100-120) AND [2] NEW-onset or WORSE than normal Disp. Time Titus Time) Disposition Final User 12/01/2024 3:19:38 PM Send to Urgent Vallery Ruther Lobe 12/01/2024 3:20:18 PM Send to Urgent Queue Ruther Lobe 12/01/2024 3:28:24 PM Go to ED Now Yes Cruise, RN, Katelyn Final Disposition 12/01/2024 3:28:24 PM Go to ED Now Yes Cruise, RN, Katelyn Caller Disagree/Comply Disagree Caller Understands Yes PreDisposition InappropriateToAsk Care Advice Given Per Guideline GO TO ED NOW: * You need to be seen in the Emergency Department. * Go to the ED at ___________ Hospital. * Leave now. Drive carefully. CARE ADVICE given per Breathing Difficulty (Adult) guideline. Comments User: Katelyn, Cruise, RN Date/Time Titus Time): 12/01/2024 3:32:28 PM caller stated she agreed with ED but pt declined- stated she will f/u with office Monday for appt Referrals GO TO FACILITY REFUSED

## 2024-12-03 NOTE — ED Triage Notes (Addendum)
 [facility] from home for BLE swelling for the past 5 days.  SHOB over the past few weeks per family.  Denies pain. Spo2 90% on room air, placed on 2L nasal cannula.  Hx of afib.

## 2024-12-03 NOTE — Telephone Encounter (Signed)
Appt today w/ Lovena Le

## 2024-12-04 ENCOUNTER — Ambulatory Visit: Admitting: Family

## 2024-12-10 ENCOUNTER — Other Ambulatory Visit: Payer: Self-pay | Admitting: Family

## 2024-12-10 DIAGNOSIS — R110 Nausea: Secondary | ICD-10-CM

## 2024-12-12 ENCOUNTER — Telehealth: Payer: Self-pay

## 2024-12-12 NOTE — Transitions of Care (Post Inpatient/ED Visit) (Signed)
" ° °  12/12/2024  Name: Carla Chandler MRN: [id number] DOB: 1926-07-29  Today's TOC FU Call Status: Today's TOC FU Call Status:: Unsuccessful Call (1st Attempt) Unsuccessful Call (1st Attempt) Date: 12/12/24  Attempted to reach the patient regarding the most recent Inpatient/ED visit.  Follow Up Plan: Additional outreach attempts will be made to reach the patient to complete the Transitions of Care (Post Inpatient/ED visit) call.   Shona Prow RN, CCM [place]  VBCI-Population Health RN Care Manager [phone]  "

## 2024-12-13 ENCOUNTER — Telehealth: Payer: Self-pay

## 2024-12-13 NOTE — Transitions of Care (Post Inpatient/ED Visit) (Signed)
" ° °  12/13/2024  Name: Carla Chandler MRN: [id number] DOB: 09/25/1926  Today's TOC FU Call Status: Today's TOC FU Call Status:: Unsuccessful Call (2nd Attempt) Unsuccessful Call (1st Attempt) Date: 12/12/24 Unsuccessful Call (2nd Attempt) Date: 12/13/24  Attempted to reach the patient regarding the most recent Inpatient/ED visit.  Follow Up Plan: Additional outreach attempts will be made to reach the patient to complete the Transitions of Care (Post Inpatient/ED visit) call.   Medford Balboa, BSN, RN [place]  VBCI - Population Health RN Care Manager [phone]  "

## 2024-12-14 ENCOUNTER — Telehealth: Payer: Self-pay

## 2024-12-14 NOTE — Transitions of Care (Post Inpatient/ED Visit) (Signed)
" ° °  12/14/2024  Name: Carla Chandler MRN: [id number] DOB: [birth year]  Today's TOC FU Call Status: Today's TOC FU Call Status:: Unsuccessful Call (3rd Attempt) Unsuccessful Call (1st Attempt) Date: 12/12/24 Unsuccessful Call (2nd Attempt) Date: 12/13/24 Unsuccessful Call (3rd Attempt) Date: 12/14/24  Attempted to reach the patient regarding the most recent Inpatient/ED visit.  Follow Up Plan: No further outreach attempts will be made at this time. We have been unable to contact the patient.  Medford Balboa, BSN, RN [place]  VBCI - Population Health RN Care Manager [phone]  "

## 2024-12-25 ENCOUNTER — Ambulatory Visit (HOSPITAL_BASED_OUTPATIENT_CLINIC_OR_DEPARTMENT_OTHER)
Admission: RE | Admit: 2024-12-25 | Discharge: 2024-12-25 | Disposition: A | Source: Ambulatory Visit | Attending: Medical | Admitting: Medical

## 2024-12-25 ENCOUNTER — Ambulatory Visit: Admitting: Medical

## 2024-12-25 VITALS — BP 128/70 | HR 56 | Temp 97.8°F | Resp 16 | Ht 64.0 in | Wt 162.8 lb

## 2024-12-25 DIAGNOSIS — R2681 Unsteadiness on feet: Secondary | ICD-10-CM

## 2024-12-25 DIAGNOSIS — D649 Anemia, unspecified: Secondary | ICD-10-CM | POA: Diagnosis not present

## 2024-12-25 DIAGNOSIS — I509 Heart failure, unspecified: Secondary | ICD-10-CM

## 2024-12-25 DIAGNOSIS — R42 Dizziness and giddiness: Secondary | ICD-10-CM

## 2024-12-25 DIAGNOSIS — I480 Paroxysmal atrial fibrillation: Secondary | ICD-10-CM

## 2024-12-25 LAB — CBC WITH DIFFERENTIAL/PLATELET
Basophils Absolute: 0 10*3/uL (ref 0.0–0.1)
Basophils Relative: 0.8 % (ref 0.0–3.0)
Eosinophils Absolute: 0.1 10*3/uL (ref 0.0–0.7)
Eosinophils Relative: 1.3 % (ref 0.0–5.0)
HCT: 31.9 % — ABNORMAL LOW (ref 36.0–46.0)
Hemoglobin: 10.5 g/dL — ABNORMAL LOW (ref 12.0–15.0)
Lymphocytes Relative: 17.8 % (ref 12.0–46.0)
Lymphs Abs: 0.9 10*3/uL (ref 0.7–4.0)
MCHC: 32.8 g/dL (ref 30.0–36.0)
MCV: 98.7 fl (ref 78.0–100.0)
Monocytes Absolute: 0.4 10*3/uL (ref 0.1–1.0)
Monocytes Relative: 7.8 % (ref 3.0–12.0)
Neutro Abs: 3.7 10*3/uL (ref 1.4–7.7)
Neutrophils Relative %: 72.3 % (ref 43.0–77.0)
Platelets: 188 10*3/uL (ref 150.0–400.0)
RBC: 3.24 Mil/uL — ABNORMAL LOW (ref 3.87–5.11)
RDW: 14.2 % (ref 11.5–15.5)
WBC: 5.1 10*3/uL (ref 4.0–10.5)

## 2024-12-25 LAB — COMPREHENSIVE METABOLIC PANEL WITH GFR
ALT: 10 U/L (ref 3–35)
AST: 12 U/L (ref 5–37)
Albumin: 3.7 g/dL (ref 3.5–5.2)
Alkaline Phosphatase: 51 U/L (ref 39–117)
BUN: 33 mg/dL — ABNORMAL HIGH (ref 6–23)
CO2: 41 meq/L — ABNORMAL HIGH (ref 19–32)
Calcium: 9.3 mg/dL (ref 8.4–10.5)
Chloride: 94 meq/L — ABNORMAL LOW (ref 96–112)
Creatinine, Ser: 0.74 mg/dL (ref 0.40–1.20)
GFR: 67.99 mL/min
Glucose, Bld: 126 mg/dL — ABNORMAL HIGH (ref 70–99)
Potassium: 3.7 meq/L (ref 3.5–5.1)
Sodium: 142 meq/L (ref 135–145)
Total Bilirubin: 0.2 mg/dL (ref 0.2–1.2)
Total Protein: 6.9 g/dL (ref 6.0–8.3)

## 2024-12-25 LAB — BRAIN NATRIURETIC PEPTIDE: Pro B Natriuretic peptide (BNP): 291 pg/mL — ABNORMAL HIGH (ref 1.0–100.0)

## 2024-12-25 MED ORDER — APIXABAN 5 MG PO TABS: 5.0000 mg | ORAL_TABLET | Freq: Two times a day (BID) | ORAL | 11 refills | Status: AC

## 2024-12-25 MED ORDER — DILTIAZEM HCL ER BEADS 360 MG PO CP24: 360.0000 mg | ORAL_CAPSULE | Freq: Every day | ORAL | 11 refills | Status: AC

## 2024-12-25 NOTE — Progress Notes (Signed)
 "  Subjective:    Patient ID: Carla Chandler, female    DOB: 27-Jun-1926, 89 y.o.   MRN: [id number]  HPI  Pt in for follow up from hospitalization.     Admission Date: 12/03/2024   PCP: Eleanor Leretha Ponto, FNP   Extended Emergency Contact Information Primary Emergency Contact: Buchmann,Judy Mobile Phone: [phone] Relation: Daughter   Hospital Course: Carla Chandler is a 89 year old female with a history of falls, glaucoma, hypertension, chronic nausea, dysphagia, and lymphoma in remission admitted to [facility] on 12/03/2024 with atrial fibrillation with RVR, acute hypoxic respiratory insufficiency 2/2 HFpEF exacerbation, and Type 2 NSTEMI. She initially presented with worsening edema, dyspnea, and tachycardia, requiring IV diuresis, diltiazem  for rate control, and Eliquis  started for [facility]. She showed improvement in hemodynamics, volume status, and respiratory function. On 1/18, she was transferred to inpatient hospital at home for ongoing IV diuresis.  Subjective   Patient reports feeling alright today. Feels tired. Got dizzy when standing for weight, normal for her. She denies shortness of breath at rest or with exertion. Denies chest pain, nausea, vomiting. Reports good urine output. Leg swelling has improved.   Eating and drinking alright.   Assessment/Plan   Acute hypoxic respiratory failure 2/2 acute HFpEF exacerbation BNP 638 Stable on room air, 2L with ambulation? Wt 158lbs at time of transfer, down from 168lbs on admission Transition to PO Lasix 40mg  today, weight is down but she still has some crackles and swelling. Unsure of dry weight -Check BMP and BNP today  Afib with RVR EP cardiology consulted and signed off 1/16 Continue Dilt 360mg  daily Started on Eliquis  this admission Keep K 4-4.5 and Mg >2 Follow up with EP cardiology as an OP in 4 weeks  Dizziness with ambulation PT/OT OP follow up   Productive cough Has not been started on  abx RPP negative No fever, leukocytosis  Dysphagia, POA OP follow up for esophageal dysmotility Soft and bite sized food  HTN: clonidine  taper off  Glaucoma: home eyedrops  Resolved medical issues: Type 2 NSTEMI: trop peaked/flat at 28. Demand 2/2 Afib with RVR and CHF exacerbation Constipation: continue bowel regimen  DVT ppx: Eliquis  (new this admission) Diet: patient provided Therapies: PT Dispo: pending further diuresis     DC'd on 12-09-2024.  H@H  Plan of Care: [facility] administered medications/procedures: RPM set up Medication delivery and education Current Health Remote Monitoring Pathway settings: Set pathway as: Set pathway as: General Monitoring Pathway Continue with alarm settings per selected pathway defaults. Continuous remote nursing care   Electronically signed by Lauraine Floria Dollene Arlyss, PA-C at 12/09/2024 1:03 PM EST  Back to top of Progress Notes Carla Almarie Dodge, MD - 12/09/2024 12:01 PM EST Formatting of this note might be different from the original. Carla Chandler is a 89 year old female with a history of falls, glaucoma, hypertension, chronic nausea, dysphagia, and lymphoma in remission admitted to [facility] on 12/03/2024 with atrial fibrillation with RVR, acute hypoxic respiratory insufficiency 2/2 HFpEF exacerbation, and Type 2 NSTEMI. She initially presented with worsening edema, dyspnea, and tachycardia, requiring IV diuresis, diltiazem  for rate control, and Eliquis  started for [facility]. She showed improvement in hemodynamics, volume status, and respiratory function. On 1/18, she was transferred to inpatient hospital at home for ongoing IV diuresis.  Acute hypoxic respiratory failure 2/2 acute HFpEF exacerbation BNP 638-->353 after diuresis Wt on admission 168 and on discharge 160 Initially required oxygen but after diuresis was weaned to RA. She remained stable on room air,  ambulated without desat, maintained 94% Will need fluid eval  in 2 days to monitor improvement.  Afib with RVR EP cardiology consulted and signed off 1/16 Continue Dilt 360mg  daily Started on Eliquis  this admission Follow up with EP cardiology as an OP in 4 weeks  Dizziness with ambulation PT/OT OP follow up  Productive cough Has not been started on abx RPP negative No fever, leukocytosis improved  Dysphagia, POA OP follow up for esophageal dysmotility Soft and bite sized food  HTN: clonidine  taper off, pressures low  Glaucoma: home eyedrops  Discussed the use of AI scribe software for clinical note transcription with the patient, who gave verbal consent to proceed.   Pt in with daughter today.  History of Present Illness   Carla Chandler is a 89 year old female with atrial fibrillation who presents for follow-up after recent hospitalization.  She was admitted to [facility] on January 12th for atrial fibrillation with rapid ventricular response, with heart rates up to 157-160 bpm leading to hypoxia and heart failure exacerbation. She was treated with diuresis and discharged on oral Lasix 40 mg each morning, Eliquis , and diltiazem  360 mg daily.  She has persistent daily atrial fibrillation with residual leg and foot swelling that has improved since hospitalization. Her oxygen saturation is now 92%, down from 94% at discharge. She has chronic lightheadedness and dizziness for years, which she links to double vision and glaucoma, without a spinning sensation. Pt reports no associated motor or sensory deficits.  During her admission her hemoglobin was 10.6 and hematocrit 32. She has glaucoma and double vision and chronic lightheadedness for years, described as lightheadedness rather than vertigo.  She recently moved into a retirement community and has unstable gait with a history of falls. She feels chronically lightheaded and uses caution when walking.   Pt reports no chest pain or any obvious shortness of breath. Overall she  reports feeling much better when admitted.       Review of Systems  Constitutional:  Positive for fatigue.       Some overall fatigue feeling described but better overall much better than on admission  HENT:  Negative for congestion and dental problem.   Respiratory:  Negative for chest tightness, shortness of breath and wheezing.        But see 2% down from 94% 02 sat on dc.  Cardiovascular:  Negative for chest pain and palpitations.  Gastrointestinal:  Negative for abdominal pain.  Genitourinary:  Negative for dysuria and frequency.  Musculoskeletal:  Negative for back pain.  Neurological:  Positive for dizziness and light-headedness. Negative for tremors, seizures, facial asymmetry, weakness and headaches.       Chronic dizzy/light headed for years. Not worse than baseline.    Past Medical History:  Diagnosis Date   Accelerated hypertension 12/04/2015   AF (paroxysmal atrial fibrillation) (HCC) 06/12/2020   Anxiety    Bilateral impacted cerumen 02/12/2018   Breast cancer (HCC)    right   Carotid artery stenosis    CN VI palsy, left 03/13/2019   Cyst of joint of shoulder 12/20/2017   Dizziness 11/12/2020   DLBCL (diffuse large B cell lymphoma) (HCC) 02/29/2016   Drusen of macula of both eyes 02/29/2016   Dry eyes, bilateral 02/29/2016   Dyslipidemia, goal LDL below 130 10/26/2016   Dysphagia 05/25/2015   Dyspnea 05/25/2015   Essential hypertension 05/27/2015   Excessive cerumen in ear canal, bilateral 04/26/2017   Frequent PAC (premature atrial contraction) 01/09/2021  Gastroesophageal reflux disease 06/12/2020   Glaucoma 06/12/2020   History of B-cell lymphoma 05/05/2015   History of breast cancer 06/12/2020   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 2013 rt radiation with balloon   Hyperlipidemia    Hypertension    Hyponatremia 05/25/2015   Formatting of this  note might be different from the original. Mild and likely 2ry to diuretic use   Idiopathic peripheral neuropathy 10/26/2016   Insomnia 06/12/2020   Knee effusion, right 01/22/2016   Laryngeal edema 05/25/2015   Lymphoma (HCC) 05/05/2015   Microcalcification of left breast on mammogram 09/27/2017   Murmur, cardiac 07/11/2020   Osteopenia of left thigh 10/26/2016   Other visual disturbances 12/04/2015   PAF (paroxysmal atrial fibrillation) (HCC) 07/11/2020   PAT (paroxysmal atrial tachycardia) 01/09/2021   Poor balance 06/12/2020   Primary hypertension 11/12/2020   Primary open angle glaucoma of left eye, moderate stage 02/29/2016   Primary osteoarthritis of both knees 01/22/2016   Primary osteoarthritis of both shoulders 04/12/2017   Primary osteoarthritis of left knee 01/11/2017   Pseudophakia of both eyes 02/29/2016   PVD (posterior vitreous detachment), both eyes 02/29/2016   Radiation injury 05/27/2015   Right rotator cuff tear arthropathy 01/03/2018   Sensorineural hearing loss (SNHL) of both ears 04/26/2017   Shortness of breath 02/13/2021   Varicose veins of left lower extremity 06/11/2020     Social History   Socioeconomic History   Marital status: Widowed    Spouse name: Not on file   Number of children: Not on file   Years of education: Not on file   Highest education level: Not on file  Occupational History   Occupation: Retired  Tobacco Use   Smoking status: Former   Smokeless tobacco: Never  Vaping Use   Vaping status: Never Used  Substance and Sexual Activity   Alcohol use: Yes    Alcohol/week: 3.0 standard drinks of alcohol    Types: 3 Glasses of wine per week    Comment: wine 3 x per week   Drug use: Never   Sexual activity: Not Currently  Other Topics Concern   Not on file  Social History Narrative   Grew up in [place], moved to [place] in her 20's   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   Husband died when she was in her  21's   Son and 2 daughters- all local   Has 5 grandchildren and 3 great grandchildren   Lives by herself   No pets   Used to enjoy golf   Enjoys golf   Social Drivers of Health   Tobacco Use: Low Risk (06/06/2024)   Received from [facility]   Patient History    Passive Exposure: Not on file    Smokeless Tobacco Use: Never    Smoking Tobacco Use: Never  Financial Resource Strain: High Risk (12/05/2024)   Received from [facility]   Overall Financial Resource Strain (CARDIA)    How hard is it for you to pay for the very basics like food, housing, medical care, and heating?: Very hard  Food Insecurity: Low Risk (12/13/2024)   Received from [facility]   Epic    Within the past 12 months, you worried that your food would run out before you got money to buy more: Never true    Within the past 12 months, the  food you bought just didn't last and you didn't have money to get more. : Never true  Transportation Needs: No Transportation Needs (12/13/2024)   Received from [facility]   Transportation    In the past 12 months, has lack of reliable transportation kept you from medical appointments, meetings, work or from getting things needed for daily living? : No  Physical Activity: Patient Unable To Answer (12/13/2024)   Received from [facility]   Exercise Vital Sign    On average, how many days per week do you engage in moderate to strenuous exercise (like a brisk walk)?: Patient unable to answer    On average, how many minutes do you engage in exercise at this level?: Patient unable to answer  Stress: No Stress Concern Present (10/13/2023)   [employer] of Occupational Health - Occupational Stress Questionnaire    Feeling of Stress : Not at all  Social Connections: Moderately Isolated (12/05/2024)   Received from [facility]   Social Connection and Isolation Panel    In a typical week, how many times do you talk on the phone with family, friends, or neighbors?: More than  three times a week    How often do you get together with friends or relatives?: Never    How often do you attend church or religious services?: 1 to 4 times per year    Do you belong to any clubs or organizations such as church groups, unions, fraternal or athletic groups, or school groups?: No    How often do you attend meetings of the clubs or organizations you belong to?: Never    Are you married, widowed, divorced, separated, never married, or living with a partner?: Widowed  Intimate Partner Violence: Not At Risk (10/13/2023)   Humiliation, Afraid, Rape, and Kick questionnaire    Fear of Current or Ex-Partner: No    Emotionally Abused: No    Physically Abused: No    Sexually Abused: No  Depression (PHQ2-9): Low Risk (12/25/2024)   Depression (PHQ2-9)    PHQ-2 Score: 0  Alcohol Screen: Low Risk (10/13/2023)   Alcohol Screen    Last Alcohol Screening Score (AUDIT): 0  Housing: Low Risk (12/13/2024)   Received from [facility]   Epic    What is your living situation today?: I have a steady place to live    Think about the place you live. Do you have problems with any of the following? Choose all that apply:: None/None on this list  Utilities: Low Risk (12/13/2024)   Received from [facility]   Utilities    In the past 12 months has the electric, gas, oil, or water company threatened to shut off services in your home? : No  Health Literacy: Adequate Health Literacy (10/13/2023)   B1300 Health Literacy    Frequency of need for help with medical instructions: Never    Past Surgical History:  Procedure Laterality Date   BLADDER SUSPENSION     BREAST SURGERY Right 2008   lumpectomy   LAPAROSCOPIC HYSTERECTOMY     LYMPHADENECTOMY Right 2016    Family History  Problem Relation Age of Onset   Heart attack Father    Lymphoma Sister    Aneurysm Brother    Cancer Sister    Stroke Sister     Allergies[1]  Medications Ordered Prior to Encounter[2]  BP 128/70   Pulse (!) 56    Temp 97.8 F (36.6 C) (Oral)   Resp 16   Ht 5'  4 (1.626 m)   Wt 162 lb 12.8 oz (73.8 kg)   SpO2 92%   BMI 27.94 kg/m   -rate checked by pulse 0x prolgonged time. Stayed mostly in 60's.     Objective:   Physical Exam  General Mental Status- Alert. General Appearance- Not in acute distress. Appears stable but fragile.   Skin General: Color- Normal Color. Moisture- Normal Moisture.  Neck  No JVD.  Chest and Lung Exam Auscultation: Breath Sounds:CTA  Cardiovascular Auscultation:Rythm- regular irregular(rate controlled atrial fibrillation) Murmurs & Other Heart Sounds:Auscultation of the heart reveals- No Murmurs.  Abdomen Inspection:-Inspeection Normal. Palpation/Percussion:Note:No mass. Palpation and Percussion of the abdomen reveal- Non Tender, Non Distended + BS, no rebound or guarding.   Neurologic Cranial Nerve exam:- CN III-XII intact(No nystagmus), symmetric smile. Strength:- 5/5 equal and symmetric strength both upper and lower extremities.   Lower ext- 1+ pedal edema bilaterally. Negative homans signs. Calfs symmetric.    Assessment & Plan:   Patient Instructions  Paroxysmal atrial fibrillation Recent exacerbation with RVR causing what appears to be demand ischemia on note review and heart failure exacerbation. Rate controlled with diltiazem , anticoagulated with Eliquis . - Continue diltiazem  360 mg daily. - Continue Eliquis  5 mg twice daily. - Monitor heart rate and oxygen saturation; report if heart rate >100 bpm or oxygen saturation <90%. - Ordered chest x-ray stat - Ordered metabolic panel for potassium, kidney function, and BNP.(stat) - Ordered CBC for hemoglobin and hematocrit(stat) - Ordered stool test for occult blood if hemoglobin and hematocrit drop.  Heart failure Recent due to atrial fibrillation with RVR, causing fluid overload and pulmonary edema. On Lasix for diuresis. - Continue Lasix 40 mg once daily in the morning. - Ordered chest  x-ray stat - Ordered metabolic panel for kidney function and electrolytes. stat - Monitor for fluid overload; on lasix 40 mg daily.  Anemia Hemoglobin at 10.6, hematocrit at 32. Possible contribution to dizziness and lightheadedness. - Ordered CBC for hemoglobin and hematocrit. - Ordered stool test for occult blood if hemoglobin and hematocrit drop.  Unsteady gait with chronic dizziness Chronic dizziness and unsteady gait, possibly related to anemia or heart failure. No acute changes or vertigo. Gross motor function intact - Ordered CBC for anemia. - Ordered metabolic panel for electrolytes and kidney function. - Consider referral to physical therapy for Epley maneuvers if vertigo develops when head turrns - Patient family requst rx for electric/powerwheelchair for mobility assistance.(Send note to pt pcp)  Htn -well controlled on cardizem .  Follow up date to be determined after lab review   Dallas Maxwell, PA-C   I personally spent a total of 55 minutes in the care of the patient today including getting/reviewing separately obtained history, performing a medically appropriate exam/evaluation, counseling and educating, placing orders, and documenting clinical information in the EHR.      [1]  Allergies Allergen Reactions   Bimatoprost Other (See Comments)    toxicity   Levofloxacin Diarrhea   Penicillins Itching   Shellfish Allergy Other (See Comments)   Statins Other (See Comments)    Myalgias - all statins  Per pt   Atorvastatin Other (See Comments)   Brimonidine Other (See Comments)   Dorzolamide Hcl-Timolol Mal Other (See Comments)   Lovastatin Other (See Comments)   Perindopril Other (See Comments)  [2]  Current Outpatient Medications on File Prior to Visit  Medication Sig Dispense Refill   apixaban  (ELIQUIS ) 5 MG TABS tablet Take 5 mg by mouth 2 (two) times daily.  b complex vitamins capsule Take 1 capsule by mouth daily.     citalopram  (CELEXA ) 20 MG tablet  Take 1 tablet (20 mg total) by mouth daily. 90 tablet 0   furosemide (LASIX) 40 MG tablet Take 40 mg by mouth daily.     ondansetron  (ZOFRAN ) 4 MG tablet TAKE 1 TABLET BY MOUTH EVERY 8 HOURS AS NEEDED FOR NAUSEA FOR VOMITING 30 tablet 0   OVER THE COUNTER MEDICATION Once PRN. Preservative Free Moisture Eye drops.     ROCKLATAN 0.02-0.005 % SOLN Apply to eye.     cloNIDine  (CATAPRES ) 0.1 MG tablet Take 1 tablet by mouth twice daily (Patient not taking: Reported on 12/25/2024) 180 tablet 0   diltiazem  (TIAZAC ) 360 MG 24 hr capsule Take 360 mg by mouth daily.     famotidine  (PEPCID ) 20 MG tablet Take 1 tablet by mouth twice daily (Patient not taking: Reported on 12/25/2024) 180 tablet 0   meloxicam  (MOBIC ) 7.5 MG tablet Take 1 tablet by mouth once daily (Patient not taking: Reported on 12/25/2024) 90 tablet 0   verapamil  (CALAN -SR) 180 MG CR tablet Take 1 tablet (180 mg total) by mouth at bedtime. (Patient not taking: Reported on 12/25/2024) 90 tablet 2   No current facility-administered medications on file prior to visit.   "

## 2024-12-25 NOTE — Patient Instructions (Addendum)
 Paroxysmal atrial fibrillation Recent exacerbation with RVR causing what appears to be demand ischemia on note review and heart failure exacerbation. Rate controlled with diltiazem , anticoagulated with Eliquis . - Continue diltiazem  360 mg daily. - Continue Eliquis  5 mg twice daily. - Monitor heart rate and oxygen saturation; report if heart rate >100 bpm or oxygen saturation <90%. - Ordered chest x-ray stat - Ordered metabolic panel for potassium, kidney function, and BNP.(stat) - Ordered CBC for hemoglobin and hematocrit(stat) - Ordered stool test for occult blood if hemoglobin and hematocrit drop.  Heart failure Recent due to atrial fibrillation with RVR, causing fluid overload and pulmonary edema. On Lasix for diuresis. - Continue Lasix 40 mg once daily in the morning. - Ordered chest x-ray stat - Ordered metabolic panel for kidney function and electrolytes. stat - Monitor for fluid overload; on lasix 40 mg daily.  Anemia Hemoglobin at 10.6, hematocrit at 32. Possible contribution to dizziness and lightheadedness. - Ordered CBC for hemoglobin and hematocrit. - Ordered stool test for occult blood if hemoglobin and hematocrit drop.  Unsteady gait with chronic dizziness Chronic dizziness and unsteady gait, possibly related to anemia or heart failure. No acute changes or vertigo. Gross motor function intact - Ordered CBC for anemia. - Ordered metabolic panel for electrolytes and kidney function. - Consider referral to physical therapy for Epley maneuvers if vertigo develops when head turrns - Patient family requst rx for electric/power wheelchair for mobility assistance.(Send note to pt pcp)  Htn -well controlled on cardizem .  Follow up date to be determined after lab review

## 2024-12-26 ENCOUNTER — Ambulatory Visit: Payer: Self-pay | Admitting: Medical

## 2024-12-26 ENCOUNTER — Encounter: Payer: Self-pay | Admitting: Family

## 2024-12-26 DIAGNOSIS — I480 Paroxysmal atrial fibrillation: Secondary | ICD-10-CM

## 2024-12-26 DIAGNOSIS — G459 Transient cerebral ischemic attack, unspecified: Secondary | ICD-10-CM

## 2024-12-26 LAB — VITAMIN B12: Vitamin B-12: >1500 pg/mL — ABNORMAL HIGH (ref 211–911)

## 2024-12-26 MED ORDER — DOXYCYCLINE HYCLATE 100 MG PO TABS: 100.0000 mg | ORAL_TABLET | Freq: Two times a day (BID) | ORAL | 0 refills | Status: AC

## 2024-12-26 NOTE — Addendum Note (Signed)
 Addended by: DORINA DALLAS DORINA PA-C M on: 12/26/2024 06:03 PM   Modules accepted: Orders

## 2024-12-27 NOTE — Addendum Note (Signed)
 Addended by: WELLS LEVORN HERO on: 12/27/2024 04:39 PM   Modules accepted: Orders

## 2024-12-27 NOTE — Telephone Encounter (Signed)
 I have reviewed.  Thank you for seeing her.

## 2024-12-27 NOTE — Telephone Encounter (Signed)
 DME order printed for Mobility wheel chair. Will fax tomorrow when provider signs form (not in office today)

## 2024-12-28 ENCOUNTER — Other Ambulatory Visit: Payer: Self-pay | Admitting: Family

## 2024-12-28 DIAGNOSIS — R110 Nausea: Secondary | ICD-10-CM

## 2024-12-28 NOTE — Telephone Encounter (Signed)
DME order signed and faxed.

## 2025-02-08 ENCOUNTER — Ambulatory Visit: Admitting: Family
